# Patient Record
Sex: Male | Born: 1985 | Race: White | Hispanic: No | Marital: Married | Smoking: Never smoker
Health system: Southern US, Community
[De-identification: ages and names within clinical notes are randomized; demographics above are authoritative.]

## PROBLEM LIST (undated history)

## (undated) DIAGNOSIS — F419 Anxiety disorder, unspecified: Secondary | ICD-10-CM

## (undated) DIAGNOSIS — G4700 Insomnia, unspecified: Secondary | ICD-10-CM

## (undated) DIAGNOSIS — K219 Gastro-esophageal reflux disease without esophagitis: Secondary | ICD-10-CM

## (undated) DIAGNOSIS — J45909 Unspecified asthma, uncomplicated: Secondary | ICD-10-CM

## (undated) DIAGNOSIS — D179 Benign lipomatous neoplasm, unspecified: Secondary | ICD-10-CM

## (undated) DIAGNOSIS — T7840XA Allergy, unspecified, initial encounter: Secondary | ICD-10-CM

## (undated) DIAGNOSIS — F32A Depression, unspecified: Secondary | ICD-10-CM

## (undated) HISTORY — DX: Unspecified asthma, uncomplicated: J45.909

## (undated) HISTORY — DX: Allergy, unspecified, initial encounter: T78.40XA

## (undated) HISTORY — DX: Depression, unspecified: F32.A

## (undated) HISTORY — DX: Anxiety disorder, unspecified: F41.9

## (undated) HISTORY — DX: Benign lipomatous neoplasm, unspecified: D17.9

## (undated) HISTORY — DX: Insomnia, unspecified: G47.00

## (undated) HISTORY — DX: Gastro-esophageal reflux disease without esophagitis: K21.9

## (undated) HISTORY — PX: WISDOM TOOTH EXTRACTION: SHX21

---
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
## 2006-11-12 ENCOUNTER — Emergency Department: Payer: Self-pay | Admitting: Unknown Physician Specialty

## 2007-02-19 ENCOUNTER — Ambulatory Visit: Payer: Self-pay | Admitting: Specialist

## 2009-09-27 ENCOUNTER — Ambulatory Visit: Payer: Self-pay | Admitting: General Practice

## 2009-11-02 ENCOUNTER — Emergency Department: Payer: Self-pay | Admitting: Emergency Medicine

## 2010-06-30 ENCOUNTER — Ambulatory Visit: Payer: Self-pay | Admitting: Family Medicine

## 2011-12-23 IMAGING — CR DG LUMBAR SPINE 2-3V
1 series · 3 of 3 positions shown · non-contrast
Comparison: none

REASON FOR EXAM: pain
COMMENTS:

[Series 2: view not recorded · 0.17mm/px · 3 of 3 slices shown]
[im 1/3]
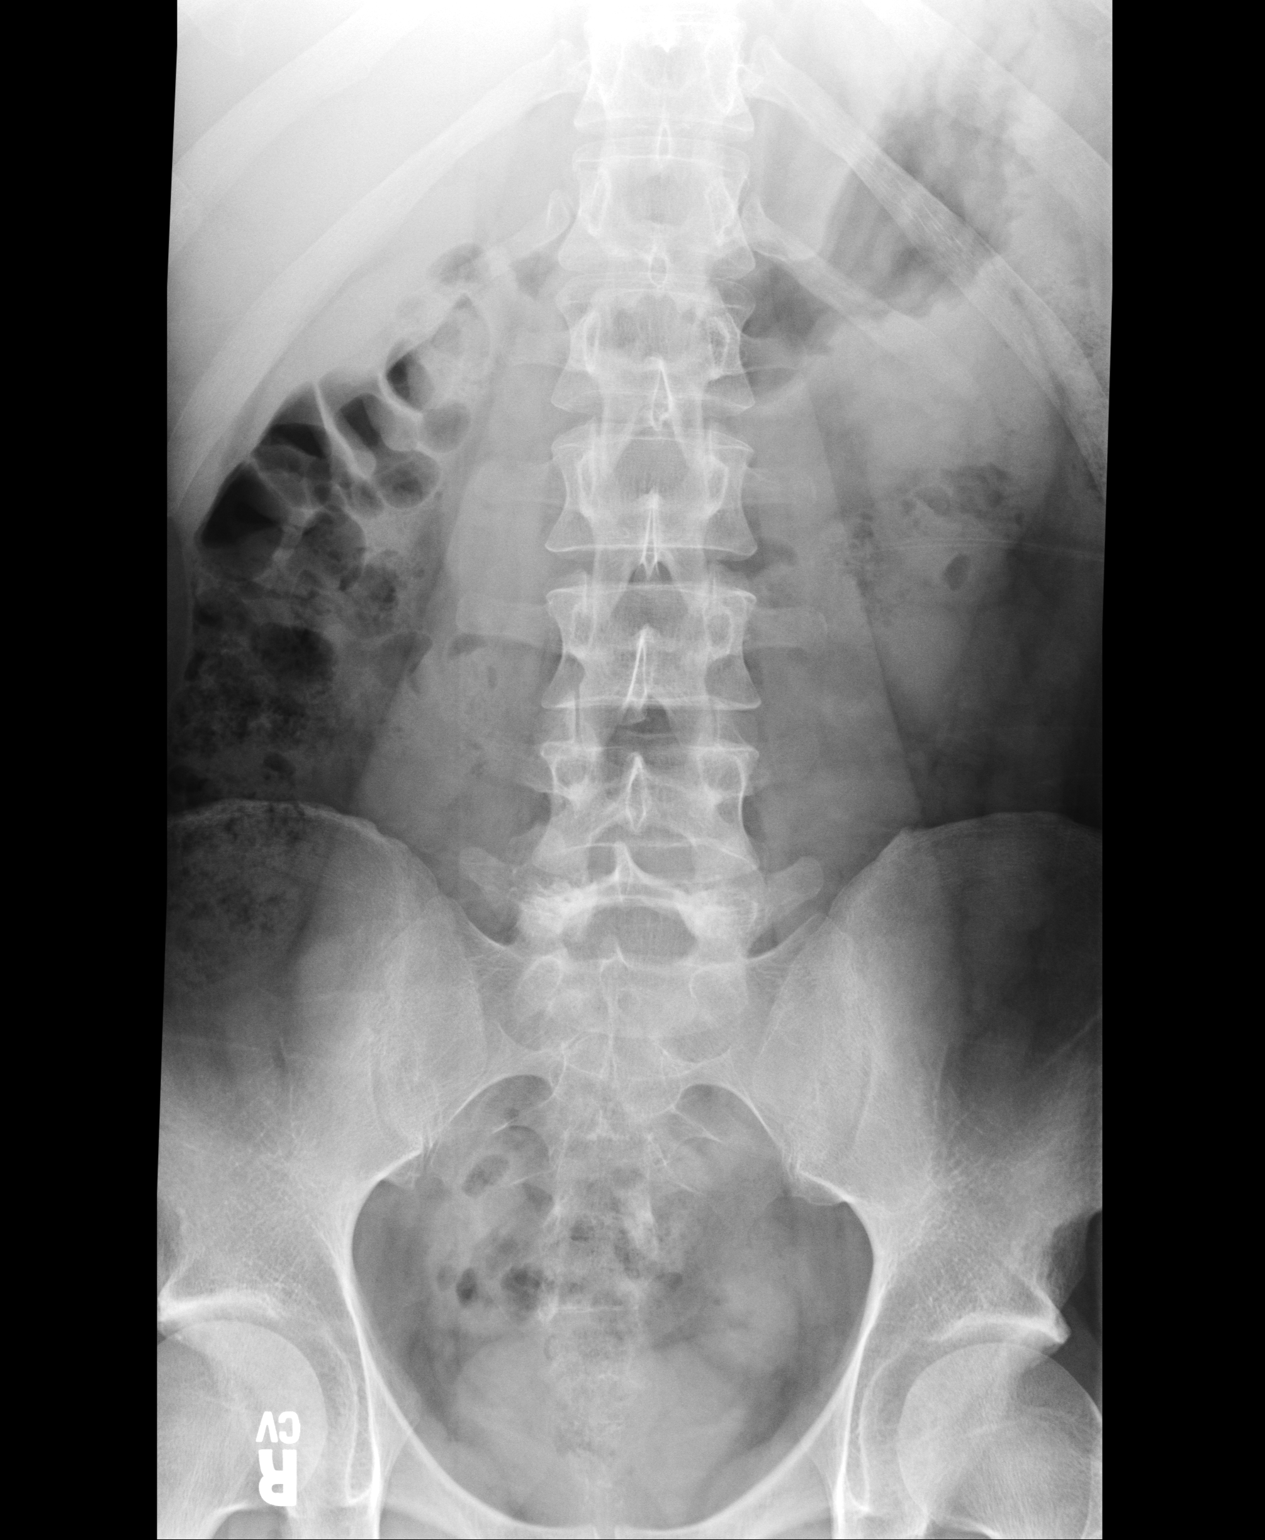
[im 2/3]
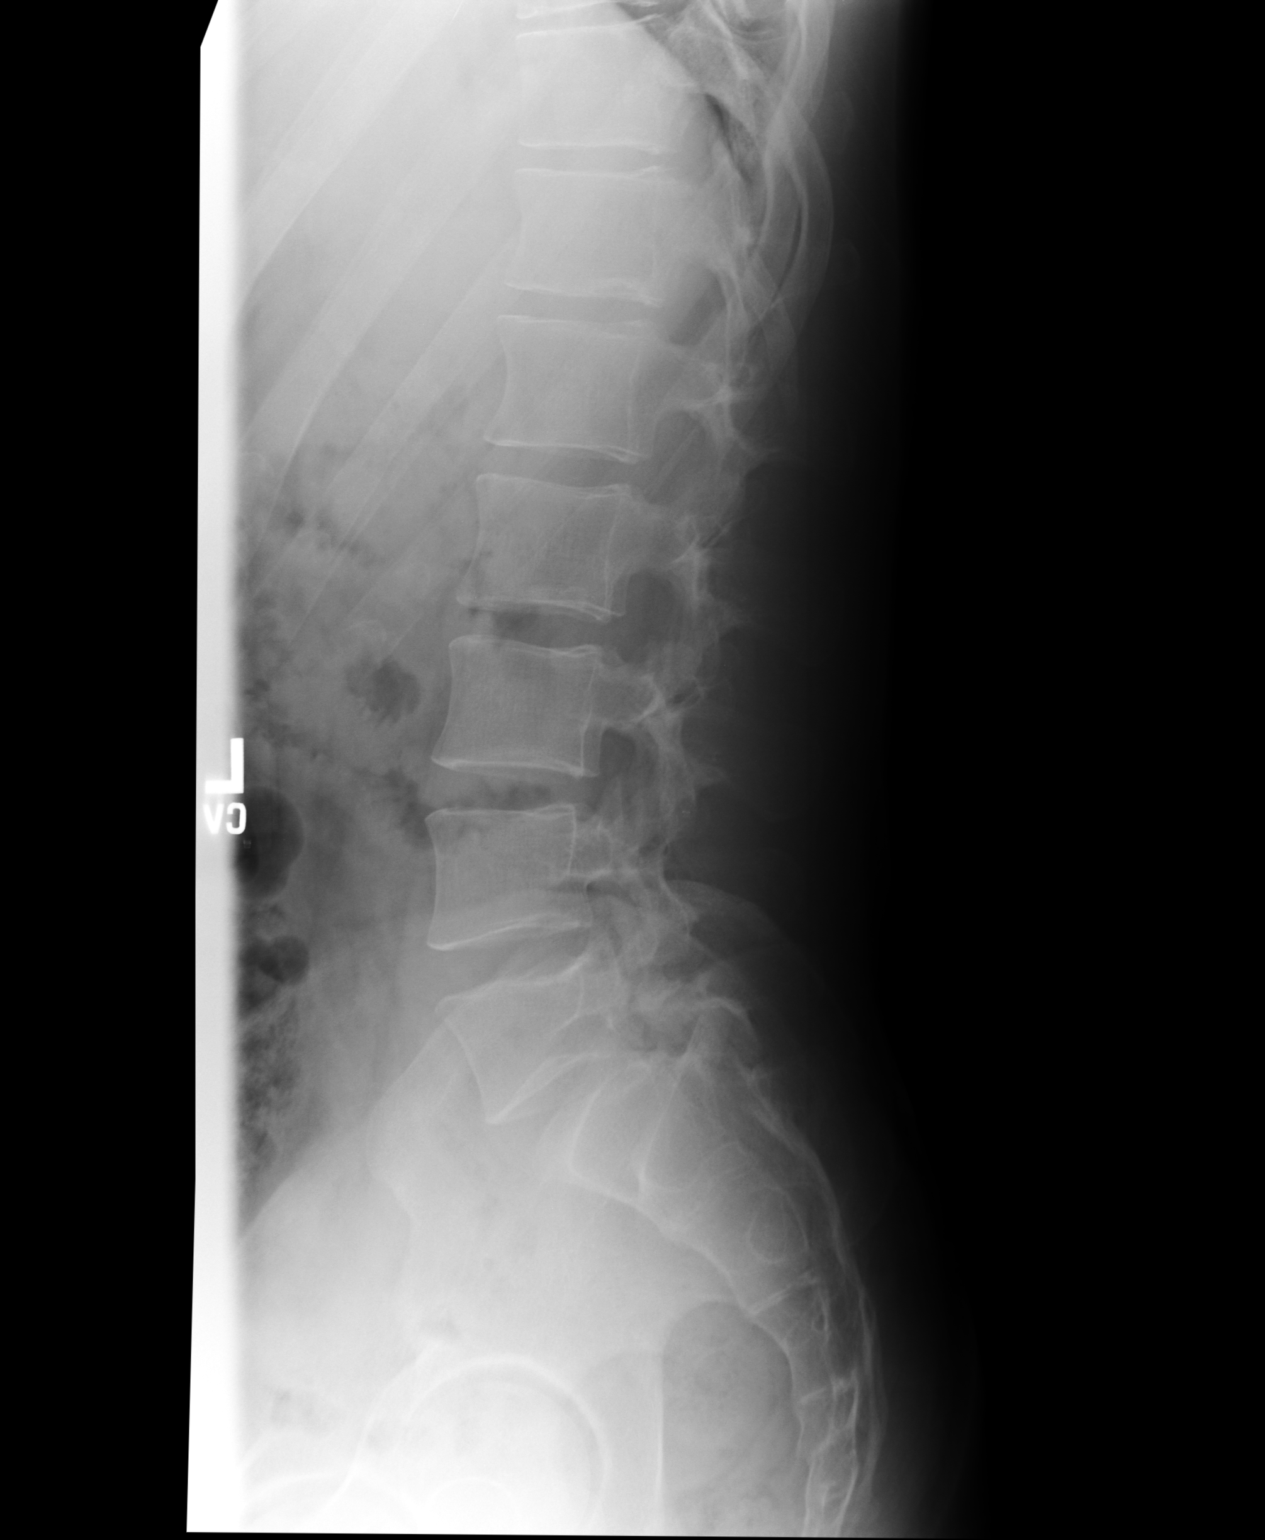
[im 3/3]
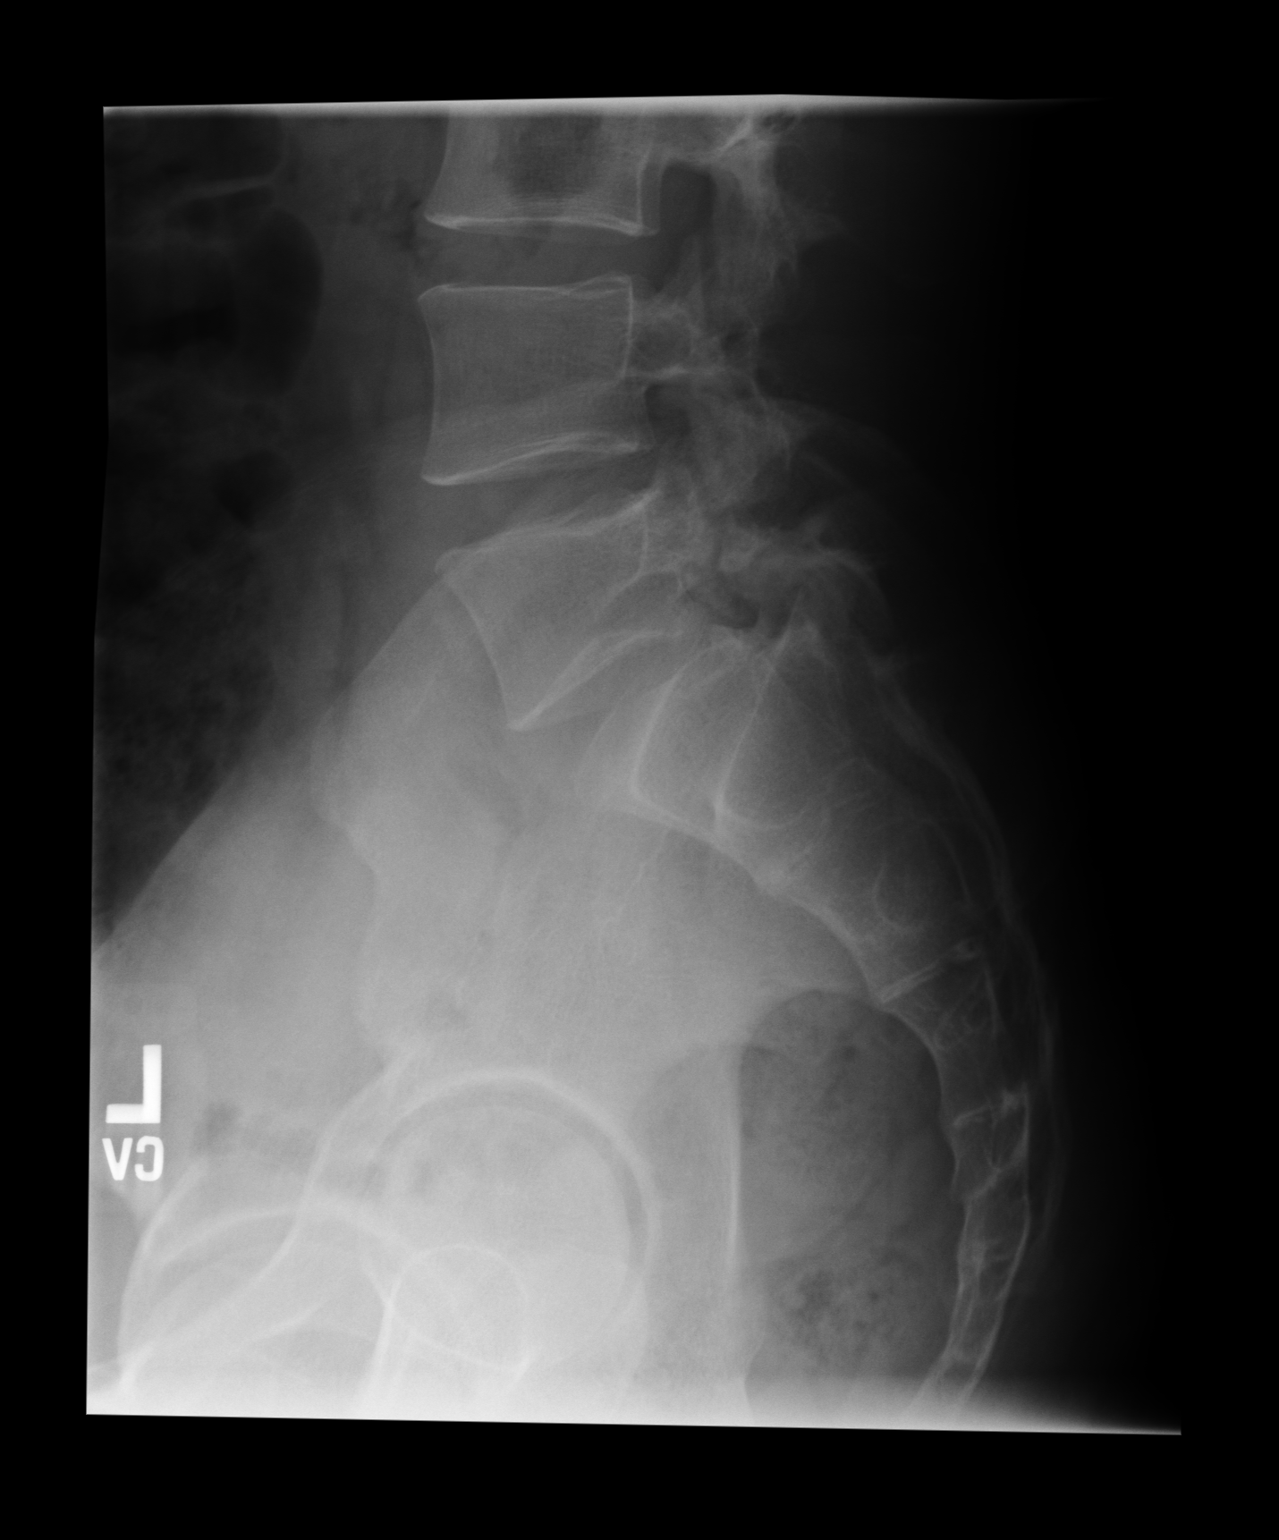

[3 of 3 positions shown; findings below may reference images not displayed]

PROCEDURE:     KDR - KDXR LUMBAR SPINE AP AND LATERAL  - September 27, 2009 [DATE]

RESULT:     The vertebral body heights and the intervertebral disc spaces
are well maintained. The vertebral alignment is normal. In the lateral view
there is noted a questionable pars interarticularis defect at L5. This could
be further evaluated CT of such is clinically indicated. The pedicles and
transverse processes are normal in appearance.
IMPRESSION: 1. No vertebral body fracture is seen.
2. Possible pars interarticularis defect at L5. This is not definite but
could be further evaluated with CT if clinically indicated .

## 2012-01-06 ENCOUNTER — Ambulatory Visit: Payer: Self-pay | Admitting: General Practice

## 2012-10-07 ENCOUNTER — Encounter: Payer: Self-pay | Admitting: Neurology

## 2012-10-19 NOTE — Telephone Encounter (Signed)
This encounter was created in error - please disregard.

## 2013-06-12 ENCOUNTER — Ambulatory Visit: Payer: Self-pay | Admitting: General Practice

## 2013-12-12 LAB — HEMOGLOBIN A1C: Hgb A1c MFr Bld: 4.9 % (ref 4.0–6.0)

## 2014-12-30 ENCOUNTER — Encounter: Payer: Self-pay | Admitting: Family Medicine

## 2014-12-30 DIAGNOSIS — J45991 Cough variant asthma: Secondary | ICD-10-CM | POA: Insufficient documentation

## 2014-12-30 DIAGNOSIS — K219 Gastro-esophageal reflux disease without esophagitis: Secondary | ICD-10-CM | POA: Insufficient documentation

## 2014-12-30 DIAGNOSIS — G4700 Insomnia, unspecified: Secondary | ICD-10-CM | POA: Insufficient documentation

## 2014-12-31 ENCOUNTER — Encounter: Payer: Self-pay | Admitting: Family Medicine

## 2014-12-31 ENCOUNTER — Ambulatory Visit (INDEPENDENT_AMBULATORY_CARE_PROVIDER_SITE_OTHER): Payer: No Typology Code available for payment source | Admitting: Family Medicine

## 2014-12-31 VITALS — BP 112/66 | HR 52 | Temp 98.0°F | Resp 16 | Ht 64.75 in | Wt 152.7 lb

## 2014-12-31 DIAGNOSIS — Z7189 Other specified counseling: Secondary | ICD-10-CM | POA: Diagnosis not present

## 2014-12-31 DIAGNOSIS — D179 Benign lipomatous neoplasm, unspecified: Secondary | ICD-10-CM | POA: Insufficient documentation

## 2014-12-31 DIAGNOSIS — Z0000 Encounter for general adult medical examination without abnormal findings: Secondary | ICD-10-CM

## 2014-12-31 DIAGNOSIS — Z131 Encounter for screening for diabetes mellitus: Secondary | ICD-10-CM

## 2014-12-31 DIAGNOSIS — Z719 Counseling, unspecified: Secondary | ICD-10-CM

## 2014-12-31 DIAGNOSIS — Z13220 Encounter for screening for lipoid disorders: Secondary | ICD-10-CM | POA: Diagnosis not present

## 2014-12-31 DIAGNOSIS — Z114 Encounter for screening for human immunodeficiency virus [HIV]: Secondary | ICD-10-CM

## 2014-12-31 DIAGNOSIS — L818 Other specified disorders of pigmentation: Secondary | ICD-10-CM | POA: Insufficient documentation

## 2014-12-31 NOTE — Patient Instructions (Signed)
Discussed importance of 150 minutes of physical activity weekly, eat two servings of fish weekly, eat one serving of tree nuts ( cashews, pistachios, pecans, almonds..) every other day, eat 6 servings of fruit/vegetables daily and drink plenty of water and avoid sweet beverages. 

## 2014-12-31 NOTE — Progress Notes (Signed)
Name: Anthony Hale   MRN: [id number]    DOB: 02-Apr-1986   Date:12/31/2014       Progress Note  Subjective  Chief Complaint  Chief Complaint  Patient presents with  . Annual Exam    HPI  Male physical:  He is not sexually active at this time, dating for the past 7 months.  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  He has been eating fast food more often because of his job, but will try to pack lunch. He has not been physically active lately because he has been working 12 hours shifts.  Patient Active Problem List   Diagnosis Date Noted  . Fatty tumor 12/31/2014  . Decorative tattoo 12/31/2014  . Asthma, cough variant 12/30/2014  . Insomnia 12/30/2014  . GERD (gastroesophageal reflux disease) 12/30/2014  . Anxiety disorder 12/30/2014  . Perennial allergic rhinitis with seasonal variation 05/23/2007    Past Surgical History  Procedure Laterality Date  . Wisdom tooth extraction      Family History  Problem Relation Age of Onset  . Cancer Mother     Breast  . Hyperlipidemia Mother   . Heart disease Father   . Hyperlipidemia Father   . Hypertension Father     History   Social History  . Marital Status: Single    Spouse Name: N/A  . Number of Children: N/A  . Years of Education: N/A   Occupational History  . Not on file.   Social History Main Topics  . Smoking status: Never Smoker   . Smokeless tobacco: Not on file  . Alcohol Use: 0.0 oz/week    0 Standard drinks or equivalent per week     Comment: rarely  . Drug Use: No  . Sexual Activity: No   Other Topics Concern  . Not on file   Social History Narrative     Current outpatient prescriptions:  .  fluticasone (FLONASE) 50 MCG/ACT nasal spray, Place 2 sprays into both nostrils daily., Disp: , Rfl:  .  Fluticasone Furoate-Vilanterol (BREO ELLIPTA) 100-25 MCG/INH AEPB, Inhale 1 puff into the lungs as needed., Disp: , Rfl:  .   levocetirizine (XYZAL) 5 MG tablet, Take 5 mg by mouth every evening., Disp: , Rfl:  .  montelukast (SINGULAIR) 10 MG tablet, Take 1 tablet by mouth daily., Disp: , Rfl:  .  PARoxetine (PAXIL) 20 MG tablet, Take 1 tablet by mouth daily., Disp: , Rfl:  .  Multiple Vitamin (ONE-A-DAY MENS PO), Take 1 tablet by mouth daily., Disp: , Rfl:   No Known Allergies   ROS  Constitutional: Negative for fever or weight change.  Respiratory: Negative for shortness of breath.  He still has a dry cough but never started Breo  Cardiovascular: Negative for chest pain or palpitations.  Gastrointestinal: Negative for abdominal pain, no bowel changes.  Musculoskeletal: Negative for gait problem or joint swelling.  Skin: Negative for rash.  Neurological: Negative for dizziness or headache.  No other specific complaints in a complete review of systems (except as listed in HPI above).  Objective  Filed Vitals:   12/31/14 1147  BP: 112/66  Pulse: 52  Temp: 98 F (36.7 C)  TempSrc: Oral  Resp: 16  Height: 5' 4.75" (1.645 m)  Weight: 152 lb 11.2 oz (69.264 kg)  SpO2: 98%    Body mass index is 25.6 kg/(m^2).  Physical Exam  Constitutional:  Patient appears well-developed and well-nourished. No distress.  HENT: Head: Normocephalic and atraumatic. Ears: B TMs ok, no erythema or effusion; Nose: Nose normal. Mouth/Throat: Oropharynx is clear and moist. No oropharyngeal exudate.  Eyes: Conjunctivae and EOM are normal. Pupils are equal, round, and reactive to light. No scleral icterus.  Neck: Normal range of motion. Neck supple. No JVD present. No thyromegaly present.  Cardiovascular: Normal rate, regular rhythm and normal heart sounds.  No murmur heard. No BLE edema. Pulmonary/Chest: Effort normal and breath sounds normal. No respiratory distress. Abdominal: Soft. Bowel sounds are normal, no distension. There is no tenderness. no masses MALE GENITALIA: Normal descended testes bilaterally, no masses  palpated, no hernias, no lesions, no discharge RECTAL: not performed Musculoskeletal: Normal range of motion, no joint effusions. No gross deformities Neurological: he is alert and oriented to person, place, and time. No cranial nerve deficit. Coordination, balance, strength, speech and gait are normal.  Skin: Skin is warm and dry. No rash noted. No erythema.  Psychiatric: Patient has a normal mood and affect. behavior is normal. Judgment and thought content normal.    PHQ2/9: Depression screen PHQ 2/9 12/31/2014  Decreased Interest 1  Down, Depressed, Hopeless 0  PHQ - 2 Score 1     Fall Risk: Fall Risk  12/31/2014  Falls in the past year? No     Assessment & Plan  1. Encounter for routine history and physical exam for male    2. Health counseling Discussed importance of 150 minutes of physical activity weekly, eat two servings of fish weekly, eat one serving of tree nuts ( cashews, pistachios, pecans, almonds.Marland Kitchen) every other day, eat 6 servings of fruit/vegetables daily and drink plenty of water and avoid sweet beverages.    3. Screening for HIV (human immunodeficiency virus)  - HIV antibody (with reflex)  4. Diabetes mellitus screening  - Glucose  5. Lipid screening  - Lipid Profile

## 2015-01-01 LAB — GLUCOSE, RANDOM: Glucose: 92 mg/dL (ref 65–99)

## 2015-01-01 LAB — LIPID PANEL
Chol/HDL Ratio: 5 ratio units (ref 0.0–5.0)
Cholesterol, Total: 179 mg/dL (ref 100–199)
HDL: 36 mg/dL — ABNORMAL LOW (ref >39)
LDL Calculated: 116 mg/dL — ABNORMAL HIGH (ref 0–99)
Triglycerides: 134 mg/dL (ref 0–149)
VLDL Cholesterol Cal: 27 mg/dL (ref 5–40)

## 2015-01-01 LAB — HIV ANTIBODY (ROUTINE TESTING W REFLEX): HIV Screen 4th Generation wRfx: NONREACTIVE

## 2015-01-02 ENCOUNTER — Telehealth: Payer: Self-pay

## 2015-01-02 NOTE — Telephone Encounter (Signed)
-----   Message from Steele Sizer, MD sent at 01/01/2015  1:40 PM EDT ----- Lipid panel shows low HDL : to improve HDL patient  needs to eat tree nuts ( pecans/pistachios/almonds ) four times weekly, eat fish two times weekly  and exercise  at least 150 minutes per week Glucose and HIV neg Please notify patient, thank you

## 2015-01-02 NOTE — Telephone Encounter (Signed)
Spoke with patient and informed him of his labs, and patient verbalized understanding of ways to bring up his HDL.

## 2015-04-02 ENCOUNTER — Ambulatory Visit: Payer: No Typology Code available for payment source | Admitting: Family Medicine

## 2015-04-03 ENCOUNTER — Ambulatory Visit (INDEPENDENT_AMBULATORY_CARE_PROVIDER_SITE_OTHER): Payer: No Typology Code available for payment source | Admitting: Family Medicine

## 2015-04-03 ENCOUNTER — Encounter: Payer: Self-pay | Admitting: Family Medicine

## 2015-04-03 VITALS — BP 114/86 | HR 61 | Temp 97.7°F | Resp 18 | Ht 65.0 in | Wt 155.7 lb

## 2015-04-03 DIAGNOSIS — K219 Gastro-esophageal reflux disease without esophagitis: Secondary | ICD-10-CM

## 2015-04-03 DIAGNOSIS — J302 Other seasonal allergic rhinitis: Secondary | ICD-10-CM

## 2015-04-03 DIAGNOSIS — J45991 Cough variant asthma: Secondary | ICD-10-CM

## 2015-04-03 DIAGNOSIS — J3089 Other allergic rhinitis: Secondary | ICD-10-CM

## 2015-04-03 DIAGNOSIS — Z23 Encounter for immunization: Secondary | ICD-10-CM

## 2015-04-03 DIAGNOSIS — F411 Generalized anxiety disorder: Secondary | ICD-10-CM

## 2015-04-03 DIAGNOSIS — J309 Allergic rhinitis, unspecified: Secondary | ICD-10-CM

## 2015-04-03 DIAGNOSIS — K589 Irritable bowel syndrome without diarrhea: Secondary | ICD-10-CM

## 2015-04-03 MED ORDER — LEVALBUTEROL TARTRATE 45 MCG/ACT IN AERO: 2.0000 | INHALATION_SPRAY | RESPIRATORY_TRACT | Status: DC | PRN

## 2015-04-03 MED ORDER — RANITIDINE HCL 300 MG PO TABS: 300.0000 mg | ORAL_TABLET | Freq: Every day | ORAL | Status: DC

## 2015-04-03 MED ORDER — HYOSCYAMINE SULFATE 0.125 MG PO TABS: 0.1250 mg | ORAL_TABLET | ORAL | Status: DC | PRN

## 2015-04-03 NOTE — Progress Notes (Addendum)
Name: Anthony Hale   MRN: [id number]    DOB: 26-Feb-1986   Date:04/03/2015       Progress Note  Subjective  Chief Complaint  Chief Complaint  Patient presents with  . Follow-up  . Asthma    not taking medications(for anything)  . Allergic Rhinitis   . Depression    HPI  Asthma Mild Intermittent: he is doing well, seen by allergist but not compliant with medication.    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   Perennial AR: also stopped taking medication, still has sniffles and sneezing, but no itchy eyes or nose at this time. He will call back when he needs refills  GERD: symptoms are returning, had two episode this week of heartburn while eating dinner . No regurgitation.  He used to take PPI's but nothing recently.   IBS: symptoms since teenage years.  Described as abdominal cramping, urgency to have a bowel movement, diarrhea. He is not sure what triggers, but seems worse in am's. No blood in stools, it does not wake him up at night. No weight loss. Evaluated many years ago but multiple test and negative ( per patient). He states the cramping is the worse symptom  Patient Active Problem List   Diagnosis Date Noted  . IBS (irritable bowel syndrome) 04/03/2015  . Fatty tumor 12/31/2014  . Decorative tattoo 12/31/2014  . Asthma, cough variant 12/30/2014  . Insomnia 12/30/2014  . GERD (gastroesophageal reflux disease) 12/30/2014  . Anxiety disorder 12/30/2014  . Perennial allergic rhinitis with seasonal variation 05/23/2007    Past Surgical History  Procedure Laterality Date  . Wisdom tooth extraction      Family History   Problem Relation Age of Onset  . Cancer Mother     Breast  . Hyperlipidemia Mother   . Heart disease Father   . Hyperlipidemia Father   . Hypertension Father     Social History   Social History  . Marital Status: Single    Spouse Name: N/A  . Number of Children: N/A  . Years of Education: N/A   Occupational History  . Not on file.   Social History Main Topics  . Smoking status: Never Smoker   . Smokeless tobacco: Never Used  . Alcohol Use: 0.0 oz/week    0 Standard drinks or equivalent per week     Comment: rarely  . Drug Use: No  . Sexual Activity: No   Other Topics Concern  . Not on file   Social History Narrative     Current outpatient prescriptions:  .  fluticasone (FLONASE) 50 MCG/ACT nasal spray, Place 2 sprays into both nostrils daily., Disp: , Rfl:  .  hyoscyamine (LEVSIN) 0.125 MG tablet, Take 1 tablet (0.125 mg total) by mouth every 4 (four) hours as needed for cramping., Disp: 30 tablet, Rfl: 2 .  levalbuterol (XOPENEX HFA) 45 MCG/ACT inhaler, Inhale 2 puffs into the lungs every 4 (four) hours as needed for wheezing., Disp: 1 Inhaler, Rfl: 0 .  levocetirizine (XYZAL) 5 MG tablet, Take 5 mg by mouth every evening., Disp: , Rfl:  .  montelukast (SINGULAIR) 10 MG tablet, Take 1  tablet by mouth daily., Disp: , Rfl:  .  Multiple Vitamin (ONE-A-DAY MENS PO), Take 1 tablet by mouth daily., Disp: , Rfl:  .  ranitidine (ZANTAC) 300 MG tablet, Take 1 tablet (300 mg total) by mouth at bedtime., Disp: 30 tablet, Rfl: 2  No Known Allergies   ROS  Constitutional: Negative for fever or weight change.  Respiratory: Negative for cough and shortness of breath.   Cardiovascular: Negative for chest pain or palpitations.  Gastrointestinal: positive for abdominal pain intermittent, no bowel changes.  Musculoskeletal: Negative for gait problem or joint swelling.  Skin: Negative for rash.  Neurological: Negative for dizziness or headache.  No other specific complaints in  a complete review of systems (except as listed in HPI above).  Objective  Filed Vitals:   04/03/15 0919  BP: 114/86  Pulse: 61  Temp: 97.7 F (36.5 C)  TempSrc: Oral  Resp: 18  Height: 5\' 5"  (1.651 m)  Weight: 155 lb 11.2 oz (70.625 kg)  SpO2: 99%    Body mass index is 25.91 kg/(m^2).  Physical Exam  Constitutional: Patient appears well-developed and well-nourished.  No distress.  HEENT: head atraumatic, normocephalic, pupils equal and reactive to light, neck supple, throat within normal limits Cardiovascular: Normal rate, regular rhythm and normal heart sounds.  No murmur heard. No BLE edema. Pulmonary/Chest: Effort normal and breath sounds normal. No respiratory distress. Abdominal: Soft.  There is no tenderness. Psychiatric: Patient has a normal mood and affect. behavior is normal. Judgment and thought content normal.   PHQ2/9: Depression screen [facility] 2/9 04/03/2015 12/31/2014  Decreased Interest 0 1  Down, Depressed, Hopeless 0 0  PHQ - 2 Score 0 1     Fall Risk: Fall Risk  04/03/2015 12/31/2014  Falls in the past year? No No      Functional Status Survey: Is the patient deaf or have difficulty hearing?: No Does the patient have difficulty seeing, even when wearing glasses/contacts?: No Does the patient have difficulty concentrating, remembering, or making decisions?: No Does the patient have difficulty walking or climbing stairs?: No Does the patient have difficulty dressing or bathing?: No Does the patient have difficulty doing errands alone such as visiting a doctor's office or shopping?: No   Assessment & Plan  1. Gastroesophageal reflux disease without esophagitis  - ranitidine (ZANTAC) 300 MG tablet; Take 1 tablet (300 mg total) by mouth at bedtime.  Dispense: 30 tablet; Refill: 2  2. Needs flu shot  - Flu Vaccine QUAD 36+ mos PF IM (Fluarix & Fluzone Quad PF)  3. Asthma, cough variant  FEV/FVC 78% of predicted, but he does not want to resume  medication at this time Discussed importance of having rescue inhaler with him -Xopenex   4. Generalized anxiety disorder Doing better, states stress is better at home  5. Perennial allergic rhinitis with seasonal variation Resume medication prn   6. IBS (irritable bowel syndrome)  - hyoscyamine (LEVSIN) 0.125 MG tablet; Take 1 tablet (0.125 mg total) by mouth every 4 (four) hours as needed for cramping.  Dispense: 30 tablet; Refill: 2

## 2015-04-08 ENCOUNTER — Telehealth: Payer: Self-pay

## 2015-04-08 NOTE — Telephone Encounter (Signed)
Can you please do a PA for this patient Inhaler. Thanks

## 2015-04-08 NOTE — Telephone Encounter (Signed)
He can't tolerate albuterol, that is why I sent for Xopenex, he will need PA, causes tachycardia and anxiety

## 2015-04-08 NOTE — Telephone Encounter (Signed)
Insurance will not cover Xopenex HFA until patient tries Ventolin HFA first per [employer]. Please change prescription and send into pharmacy.

## 2015-04-09 NOTE — Telephone Encounter (Signed)
PA was done on this and unfortunately was denied

## 2015-04-12 ENCOUNTER — Other Ambulatory Visit: Payer: Self-pay | Admitting: Family Medicine

## 2015-04-12 MED ORDER — ALBUTEROL SULFATE HFA 108 (90 BASE) MCG/ACT IN AERS: 2.0000 | INHALATION_SPRAY | Freq: Four times a day (QID) | RESPIRATORY_TRACT | Status: DC | PRN

## 2015-04-12 NOTE — Progress Notes (Signed)
done

## 2015-04-12 NOTE — Telephone Encounter (Signed)
Is there another Inhaler you could prescribed the patient? The PA for Xopenex was denied.

## 2015-04-29 ENCOUNTER — Ambulatory Visit: Payer: No Typology Code available for payment source | Admitting: Family Medicine

## 2015-05-02 ENCOUNTER — Ambulatory Visit: Payer: No Typology Code available for payment source | Admitting: Family Medicine

## 2015-05-20 ENCOUNTER — Encounter: Payer: Self-pay | Admitting: Family Medicine

## 2015-05-20 ENCOUNTER — Encounter (INDEPENDENT_AMBULATORY_CARE_PROVIDER_SITE_OTHER): Payer: Self-pay

## 2015-05-20 ENCOUNTER — Ambulatory Visit (INDEPENDENT_AMBULATORY_CARE_PROVIDER_SITE_OTHER): Payer: No Typology Code available for payment source | Admitting: Family Medicine

## 2015-05-20 VITALS — BP 124/68 | HR 65 | Temp 98.2°F | Resp 16 | Ht 65.0 in | Wt 155.1 lb

## 2015-05-20 DIAGNOSIS — K219 Gastro-esophageal reflux disease without esophagitis: Secondary | ICD-10-CM | POA: Diagnosis not present

## 2015-05-20 DIAGNOSIS — Z23 Encounter for immunization: Secondary | ICD-10-CM | POA: Diagnosis not present

## 2015-05-20 DIAGNOSIS — Z113 Encounter for screening for infections with a predominantly sexual mode of transmission: Secondary | ICD-10-CM

## 2015-05-20 NOTE — Progress Notes (Signed)
Name: Anthony Hale   MRN: [id number]    DOB: [birth year]   Date:05/20/2015       Progress Note  Subjective  Chief Complaint  Chief Complaint  Patient presents with  . Labs Only    patient states is getting married and wants to be checked out for STD's patient states not having any syptoms    HPI  GERD: he has noticed burning sensation that goes up to his throat, over this past weekend, he states he ate home made [place] food, but resolved without medication. No regurgitation.   Engaged: he is getting married in March 2016 to [place], they have not been sexually active, he would like to be checked for all STI's - except HIV since he recently had labs done.   Patient Active Problem List   Diagnosis Date Noted  . IBS (irritable bowel syndrome) 04/03/2015  . Fatty tumor 12/31/2014  . Decorative tattoo 12/31/2014  . Asthma, cough variant 12/30/2014  . Insomnia 12/30/2014  . GERD (gastroesophageal reflux disease) 12/30/2014  . Anxiety disorder 12/30/2014  . Perennial allergic rhinitis with seasonal variation 05/23/2007    Past Surgical History  Procedure Laterality Date  . Wisdom tooth extraction      Family History  Problem Relation Age of Onset  . Cancer Mother     Breast  . Hyperlipidemia Mother   . Heart disease Father   . Hyperlipidemia Father   . Hypertension Father     Social History   Social History  . Marital Status: Single    Spouse Name: N/A  . Number of Children: N/A  . Years of Education: N/A   Occupational History  . Not on file.   Social History Main Topics  . Smoking status: Never Smoker   . Smokeless tobacco: Never Used  . Alcohol Use: 0.0 oz/week    0 Standard drinks or equivalent per week     Comment: rarely  . Drug Use: No  . Sexual Activity: No   Other Topics Concern  . Not on file   Social History Narrative     Current outpatient prescriptions:  .  albuterol (PROVENTIL HFA;VENTOLIN HFA) 108 (90 BASE) MCG/ACT inhaler, Inhale 2  puffs into the lungs every 6 (six) hours as needed for wheezing or shortness of breath., Disp: 1 Inhaler, Rfl: 1 .  fluticasone (FLONASE) 50 MCG/ACT nasal spray, Place 2 sprays into both nostrils daily., Disp: , Rfl:  .  hyoscyamine (LEVSIN) 0.125 MG tablet, Take 1 tablet (0.125 mg total) by mouth every 4 (four) hours as needed for cramping., Disp: 30 tablet, Rfl: 2 .  levocetirizine (XYZAL) 5 MG tablet, Take 5 mg by mouth every evening., Disp: , Rfl:  .  montelukast (SINGULAIR) 10 MG tablet, Take 1 tablet by mouth daily., Disp: , Rfl:  .  Multiple Vitamin (ONE-A-DAY MENS PO), Take 1 tablet by mouth daily., Disp: , Rfl:  .  ranitidine (ZANTAC) 300 MG tablet, Take 1 tablet (300 mg total) by mouth at bedtime., Disp: 30 tablet, Rfl: 2  No Known Allergies   ROS  Ten systems reviewed and is negative except as mentioned in HPI   Objective  Filed Vitals:   05/20/15 1036  BP: 124/68  Pulse: 65  Temp: 98.2 F (36.8 C)  TempSrc: Oral  Resp: 16  Height: 5\' 5"  (1.651 m)  Weight: 155 lb 1.6 oz (70.353 kg)  SpO2: 97%    Body mass index is 25.81 kg/(m^2).  Physical Exam  Constitutional: Patient  appears well-developed and well-nourished.  No distress.  HEENT: head atraumatic, normocephalic, pupils equal and reactive to light, neck supple, throat within normal limits Cardiovascular: Normal rate, regular rhythm and normal heart sounds.  No murmur heard. No BLE edema. Pulmonary/Chest: Effort normal and breath sounds normal. No respiratory distress. Abdominal: Soft.  There is no tenderness. Psychiatric: Patient has a normal mood and affect. behavior is normal. Judgment and thought content normal.  PHQ2/9: Depression screen [facility] 2/9 04/03/2015 12/31/2014  Decreased Interest 0 1  Down, Depressed, Hopeless 0 0  PHQ - 2 Score 0 1    Fall Risk: Fall Risk  04/03/2015 12/31/2014  Falls in the past year? No No     Assessment & Plan  1. Gastroesophageal reflux disease without  esophagitis  Continue prn medication   2. Routine screening for STI (sexually transmitted infection)  - [facility] - GC/chlamydia probe amp, urine - HSV 2 antibody, IgG

## 2015-05-21 LAB — HSV 2 ANTIBODY, IGG: HSV 2 Glycoprotein G Ab, IgG: <0.91 index (ref 0.00–0.90)

## 2015-05-21 LAB — RPR: RPR: NONREACTIVE

## 2015-05-24 LAB — GC/CHLAMYDIA PROBE AMP, URINE

## 2015-05-28 ENCOUNTER — Encounter: Payer: Self-pay | Admitting: Family Medicine

## 2015-06-26 ENCOUNTER — Other Ambulatory Visit
Admission: RE | Admit: 2015-06-26 | Discharge: 2015-06-26 | Disposition: A | Discharge status: Home / Self Care | Payer: Worker's Compensation | Attending: Family Medicine | Admitting: Family Medicine

## 2015-06-26 NOTE — ED Notes (Signed)
Patient ambulatory to triage with steady gait, without difficulty or distress noted; pt employeed with [employer]; brought in for post accident workers comp screening ([employer] only per profile and per supervisor); pt denies any injuries or c/o or need to be seen by ED provider; workers comp screening completed by EDT, [place]

## 2015-07-25 ENCOUNTER — Telehealth: Payer: Self-pay

## 2015-07-25 ENCOUNTER — Other Ambulatory Visit: Payer: Self-pay | Admitting: Family Medicine

## 2015-07-25 NOTE — Telephone Encounter (Signed)
Patient requesting refill. 

## 2015-07-25 NOTE — Telephone Encounter (Signed)
Patient called about his Paxil rx. After checking his records I see where it was sent electronically but the transmission failed, so I verbally called it in to [facility] in [place].

## 2015-08-28 ENCOUNTER — Ambulatory Visit (INDEPENDENT_AMBULATORY_CARE_PROVIDER_SITE_OTHER): Payer: Managed Care, Other (non HMO) | Admitting: Family Medicine

## 2015-08-28 ENCOUNTER — Encounter: Payer: Self-pay | Admitting: Family Medicine

## 2015-08-28 VITALS — BP 112/64 | HR 81 | Temp 98.4°F | Resp 16 | Ht 65.0 in | Wt 153.9 lb

## 2015-08-28 DIAGNOSIS — F330 Major depressive disorder, recurrent, mild: Secondary | ICD-10-CM | POA: Diagnosis not present

## 2015-08-28 DIAGNOSIS — F411 Generalized anxiety disorder: Secondary | ICD-10-CM | POA: Diagnosis not present

## 2015-08-28 DIAGNOSIS — J309 Allergic rhinitis, unspecified: Secondary | ICD-10-CM | POA: Diagnosis not present

## 2015-08-28 DIAGNOSIS — J302 Other seasonal allergic rhinitis: Secondary | ICD-10-CM

## 2015-08-28 DIAGNOSIS — H9313 Tinnitus, bilateral: Secondary | ICD-10-CM

## 2015-08-28 DIAGNOSIS — G4700 Insomnia, unspecified: Secondary | ICD-10-CM

## 2015-08-28 DIAGNOSIS — K219 Gastro-esophageal reflux disease without esophagitis: Secondary | ICD-10-CM | POA: Diagnosis not present

## 2015-08-28 DIAGNOSIS — J3089 Other allergic rhinitis: Secondary | ICD-10-CM

## 2015-08-28 MED ORDER — TRAZODONE HCL 50 MG PO TABS: 25.0000 mg | ORAL_TABLET | Freq: Every evening | ORAL | Status: DC | PRN

## 2015-08-28 MED ORDER — PAROXETINE HCL 20 MG PO TABS: ORAL_TABLET | ORAL | Status: DC

## 2015-08-28 MED ORDER — LEVOCETIRIZINE DIHYDROCHLORIDE 5 MG PO TABS: 5.0000 mg | ORAL_TABLET | Freq: Every evening | ORAL | Status: DC

## 2015-08-28 MED ORDER — FLUTICASONE PROPIONATE 50 MCG/ACT NA SUSP: 2.0000 | Freq: Every day | NASAL | Status: DC

## 2015-08-28 MED ORDER — MONTELUKAST SODIUM 10 MG PO TABS: 10.0000 mg | ORAL_TABLET | Freq: Every day | ORAL | Status: DC
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
## 2015-09-07 IMAGING — CR DG CHEST 2V
1 series · 2 of 2 positions shown · non-contrast
Comparison: 01/06/2012

CLINICAL DATA: Nonproductive cough

EXAM:
CHEST  2 VIEW

[Series 1: pa · 0.17mm/px · 2 of 2 slices shown]
[im 1/2]
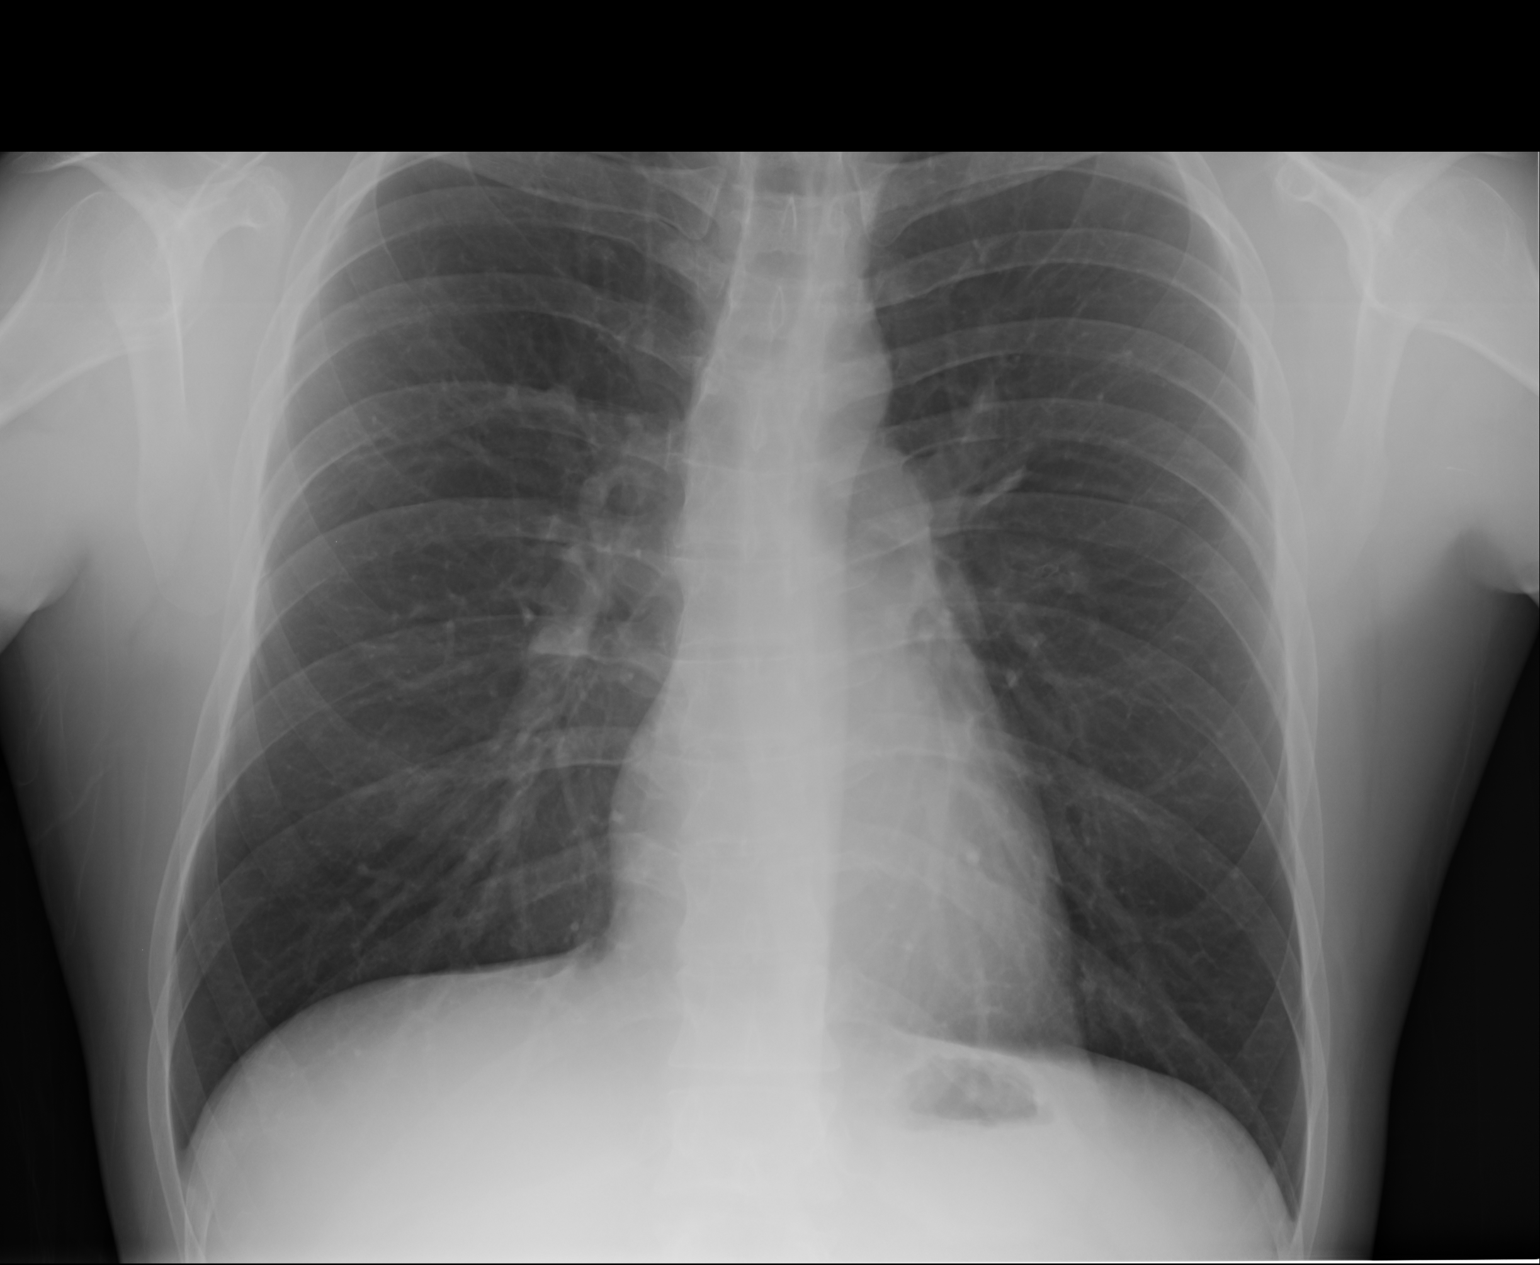
[im 2/2]
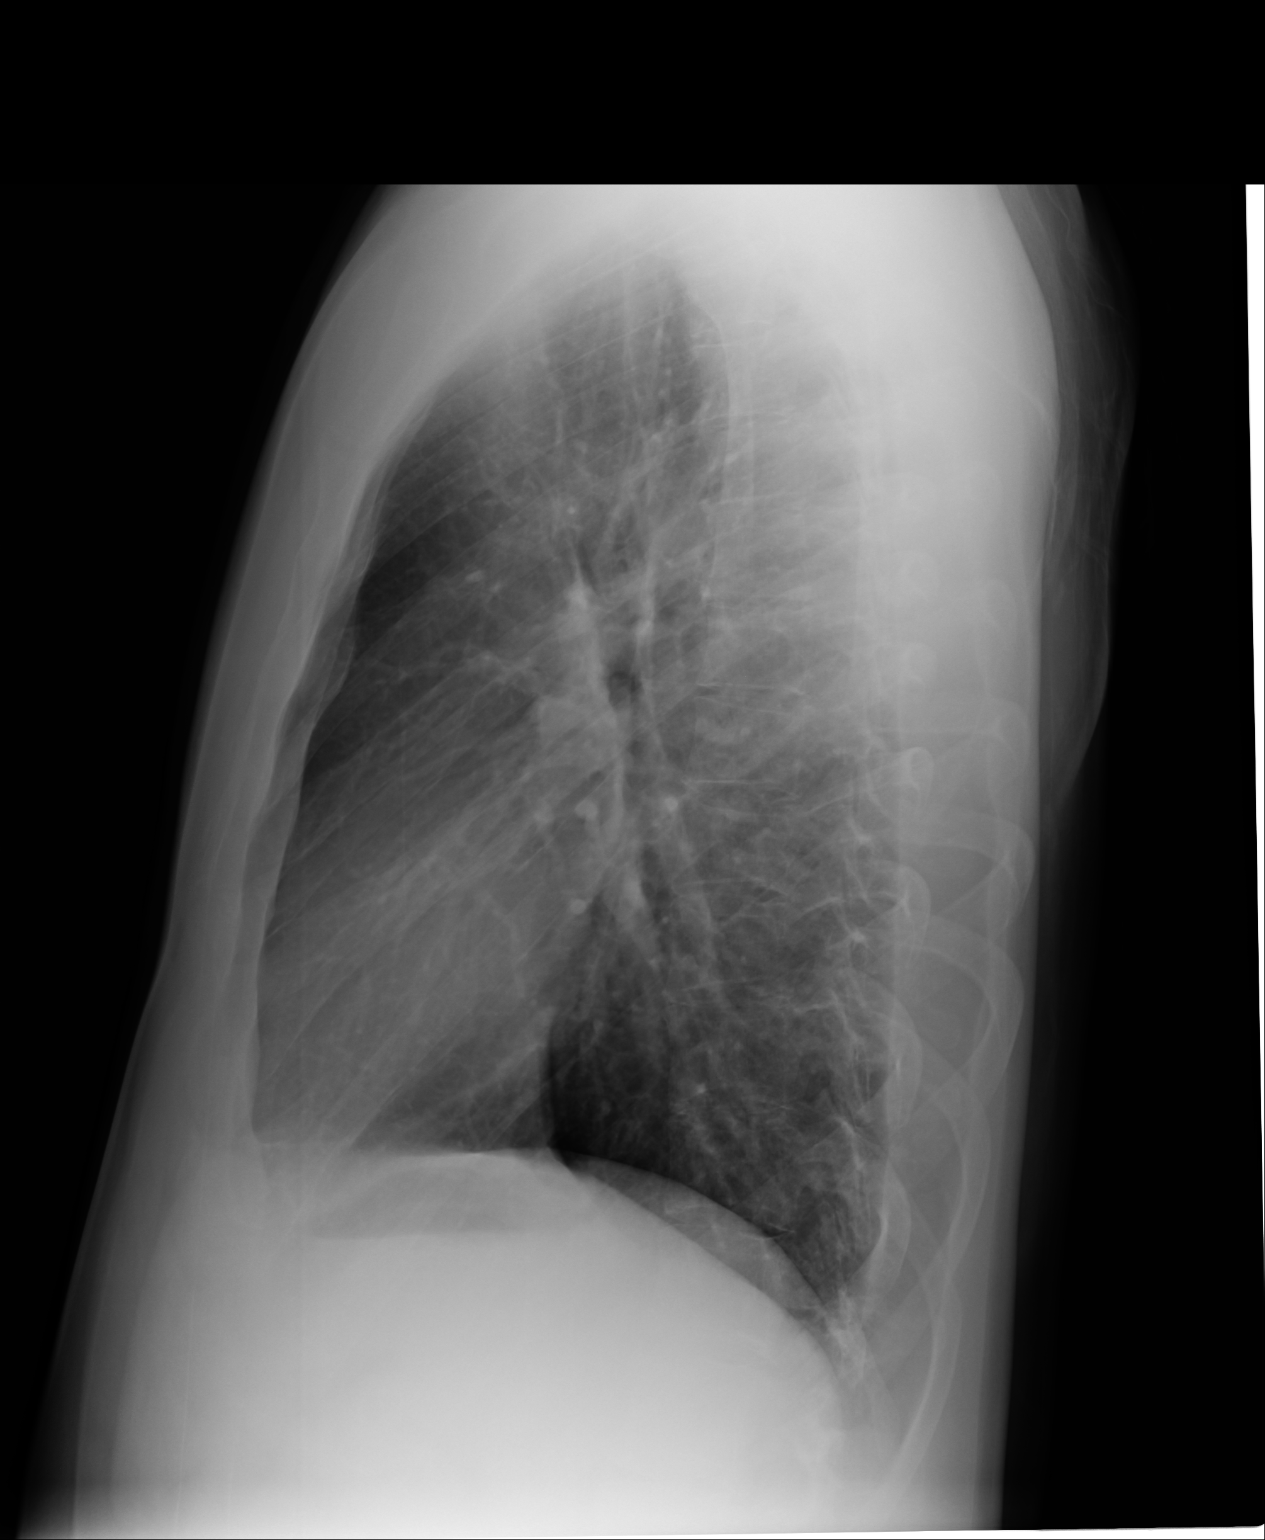

[2 of 2 positions shown; findings below may reference images not displayed]

FINDINGS: Cardiac shadow is stable. The lungs are clear bilaterally. No acute
bony abnormality is seen.
IMPRESSION: No acute abnormality noted.

## 2015-09-25 ENCOUNTER — Ambulatory Visit: Payer: Managed Care, Other (non HMO) | Admitting: Family Medicine

## 2015-10-01 ENCOUNTER — Ambulatory Visit (INDEPENDENT_AMBULATORY_CARE_PROVIDER_SITE_OTHER): Payer: Managed Care, Other (non HMO) | Admitting: Family Medicine

## 2015-10-01 ENCOUNTER — Encounter: Payer: Self-pay | Admitting: Family Medicine

## 2015-10-01 VITALS — BP 110/60 | HR 75 | Temp 97.7°F | Resp 16 | Wt 168.1 lb

## 2015-10-01 DIAGNOSIS — J309 Allergic rhinitis, unspecified: Secondary | ICD-10-CM

## 2015-10-01 DIAGNOSIS — G4700 Insomnia, unspecified: Secondary | ICD-10-CM

## 2015-10-01 DIAGNOSIS — F330 Major depressive disorder, recurrent, mild: Secondary | ICD-10-CM | POA: Diagnosis not present

## 2015-10-01 DIAGNOSIS — F411 Generalized anxiety disorder: Secondary | ICD-10-CM

## 2015-10-01 DIAGNOSIS — J45991 Cough variant asthma: Secondary | ICD-10-CM | POA: Diagnosis not present

## 2015-10-01 DIAGNOSIS — J3089 Other allergic rhinitis: Secondary | ICD-10-CM

## 2015-10-01 DIAGNOSIS — J302 Other seasonal allergic rhinitis: Secondary | ICD-10-CM

## 2015-10-01 MED ORDER — PAROXETINE HCL 20 MG PO TABS: ORAL_TABLET | ORAL | Status: DC

## 2015-10-01 MED ORDER — LORATADINE 10 MG PO TABS: 10.0000 mg | ORAL_TABLET | Freq: Every day | ORAL | Status: DC
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
## 2015-10-17 ENCOUNTER — Emergency Department
Admission: EM | Admit: 2015-10-17 | Discharge: 2015-10-17 | Disposition: A | Discharge status: Home / Self Care | Payer: Worker's Compensation | Attending: Emergency Medicine | Admitting: Emergency Medicine

## 2015-10-17 DIAGNOSIS — J45909 Unspecified asthma, uncomplicated: Secondary | ICD-10-CM | POA: Insufficient documentation

## 2015-10-17 DIAGNOSIS — F1099 Alcohol use, unspecified with unspecified alcohol-induced disorder: Secondary | ICD-10-CM | POA: Diagnosis not present

## 2015-10-17 DIAGNOSIS — Z7721 Contact with and (suspected) exposure to potentially hazardous body fluids: Secondary | ICD-10-CM | POA: Insufficient documentation

## 2015-10-17 DIAGNOSIS — K219 Gastro-esophageal reflux disease without esophagitis: Secondary | ICD-10-CM | POA: Insufficient documentation

## 2015-10-17 DIAGNOSIS — D179 Benign lipomatous neoplasm, unspecified: Secondary | ICD-10-CM | POA: Insufficient documentation

## 2015-10-17 DIAGNOSIS — F333 Major depressive disorder, recurrent, severe with psychotic symptoms: Secondary | ICD-10-CM | POA: Insufficient documentation

## 2015-10-17 DIAGNOSIS — F411 Generalized anxiety disorder: Secondary | ICD-10-CM | POA: Diagnosis not present

## 2015-10-17 DIAGNOSIS — Z79899 Other long term (current) drug therapy: Secondary | ICD-10-CM | POA: Insufficient documentation

## 2015-10-17 LAB — BASIC METABOLIC PANEL
ANION GAP: 5 (ref 5–15)
BUN: 13 mg/dL (ref 6–20)
CALCIUM: 9.9 mg/dL (ref 8.9–10.3)
CO2: 29 mmol/L (ref 22–32)
Chloride: 103 mmol/L (ref 101–111)
Creatinine, Ser: 0.89 mg/dL (ref 0.61–1.24)
GFR calc Af Amer: >60 mL/min (ref >60)
Glucose, Bld: 101 mg/dL — ABNORMAL HIGH (ref 65–99)
POTASSIUM: 3.9 mmol/L (ref 3.5–5.1)
SODIUM: 137 mmol/L (ref 135–145)

## 2015-10-17 LAB — CBC WITH DIFFERENTIAL/PLATELET
BASOS ABS: 0 10*3/uL (ref 0–0.1)
Basophils Relative: 0 %
EOS ABS: 0.1 10*3/uL (ref 0–0.7)
EOS PCT: 1 %
HCT: 46.2 % (ref 40.0–52.0)
Hemoglobin: 16 g/dL (ref 13.0–18.0)
Lymphocytes Relative: 11 %
Lymphs Abs: 1.4 10*3/uL (ref 1.0–3.6)
MCH: 29.8 pg (ref 26.0–34.0)
MCHC: 34.6 g/dL (ref 32.0–36.0)
MCV: 86.1 fL (ref 80.0–100.0)
Monocytes Absolute: 0.8 10*3/uL (ref 0.2–1.0)
Monocytes Relative: 6 %
Neutro Abs: 10.5 10*3/uL — ABNORMAL HIGH (ref 1.4–6.5)
Neutrophils Relative %: 82 %
PLATELETS: 259 10*3/uL (ref 150–440)
RBC: 5.37 MIL/uL (ref 4.40–5.90)
RDW: 13.2 % (ref 11.5–14.5)
WBC: 12.7 10*3/uL — AB (ref 3.8–10.6)

## 2015-10-17 NOTE — ED Notes (Signed)
[facility] tubes and paperwork walked to lab by [employer] and given to [employer]
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
## 2015-10-17 NOTE — Discharge Instructions (Signed)
Body Fluid Exposure Information  People may come into contact with blood and other body fluids under various circumstances. In some cases, body fluids may contain germs (bacteria or viruses) that cause infections. These germs can be spread when another person's body fluids come into contact with your skin, mouth, eyes, or genitals.   Exposure to body fluids that may contain infectious material is a common problem for people providing care for others who are ill. It can occur when a person is performing health care tasks in the workplace or when taking care of a family member at home. Other common methods of exposure include injection drug use, sharing needles, and sexual activity.  The risk of an infection spreading through body fluid exposure is small and depends on a variety of factors. This includes the type of body fluid, the nature of the exposure, and the health status of the person who was the source of the body fluids. Your health care provider can help you assess the risk.  WHAT TYPES OF BODY FLUID CAN SPREAD INFECTION?  The following types of body fluid have the potential to spread infections:   Blood.   Semen.   Vaginal secretions.   Urine.   Feces.   Saliva.   Nasal or eye discharge.   Breast milk.   Amniotic fluid and fluids surrounding body organs.  WHAT ARE SOME FIRST-AID MEASURES FOR BODY FLUID EXPOSURE?  The following steps should be taken as soon as possible after a person is exposed to body fluids:  Intact Skin   For contact with closed skin, wash the area with soap and water.  Broken Skin   For contact with broken skin (a wound), wash the area with soap and water. Let the area bleed a little. Then place a bandage or clean towel on the wound, applying gentle pressure to stop the bleeding. Do not squeeze or rub the area.   Use just water or hand sanitizer if a sink with soap is not available.   Do not use harsh chemicals such as bleach or iodine.  Eyes   Rinse the eyes with water or  saline for 30 seconds.   If the person is wearing contact lenses, leave the contact lenses in while rinsing the eyes. Once the rinsing is complete, remove the contact lenses.  Mouth   Spit out the fluids. Rinse and spit with water 4-5 times.  In addition, you should remove any clothing that comes into contact with body fluids. However, if body fluid exposure results from sexual assault, seek medical care immediately without changing clothes or bathing.  WHEN SHOULD YOU SEEK HELP?  After performing the proper first-aid steps, you should contact your health care provider or seek emergency care right away if blood or other body fluids made contact with areas of broken skin or openings such as the eyes or mouth. If the exposure to body fluid happened in the workplace, you should report it to your work supervisor immediately. Many workplaces have procedures in place for exposure situations.  WHAT WILL HAPPEN AFTER YOU REPORT THE EXPOSURE?  Your health care provider will ask you several questions. Information requested may include:   Your medical history, including vaccination records.   Date and time of the exposure.   Whether you saw body fluids during the exposure.   Type of body fluid you were exposed to.   Volume of body fluid you were exposed to.   How the exposure happened.   If any devices,   such as a needle, were being used.   Which area of your body made contact with the body fluid.   Description of any injury to the skin or other area.   How long contact was made with the body fluid.   Any information you have about the health status of the person whose body fluid you were exposed to.  The health care provider will assess your risk of infection. Often, no treatment is necessary. In some cases, the health care provider may recommend doing blood tests right away. Follow-up blood tests may also be done at certain intervals during the upcoming weeks and months to check for changes. You may be offered  treatment to prevent an infection from developing after exposure (post-exposure prophylaxis). This may include certain vaccinations or medicines and may be necessary when there is a risk of a serious infection, such as HIV or hepatitis B. Your health care provider should discuss appropriate treatment and vaccinations with you.  HOW CAN YOU PREVENT EXPOSURE AND INFECTION?  Always remember that prevention is the first line of defense against body fluid exposure. To help prevent exposure to body fluids:   Wash and disinfect countertops and other surfaces regularly.   Wear appropriate protective gear such as gloves, gowns, or eyewear when the possibility of exposure is present.   Wipe away spills of body fluid with disposable towels.   Properly dispose of blood products and other fluids. Use secured bags.   Properly dispose of needles and other instruments with sharp points or edges (sharps). Use closed, marked containers.   Avoid injection drug use.   Do not share needles.   Avoid recapping needles.   Use a condom during sexual intercourse.   Make sure you learn and follow any guidelines for preventing exposure (universal precautions) provided at your workplace.  To help reduce your chances of getting an infection:   Make sure your vaccinations are up-to-date, including those for tetanus and hepatitis.   Wash your hands frequently with soap and water. Use hand sanitizers.   Avoid having multiple sex partners.   Follow up with your health care provider as directed after being evaluated for an exposure to body fluids.  To avoid spreading infection to others:   Do not have sexual relations until you know you are free of infection.   Do not donate blood, plasma, breast milk, sperm, or other body fluids.   Do not share hygiene tools such as toothbrushes, razors, or dental floss.   Keep open wounds covered.   Dispose of any items with blood on them (razors, tampons, bandages) by putting them in the  trash.   Do not share drug supplies with others, such as needles, syringes, straws, or pipes.   Follow all of your health care provider's instructions for preventing the spread of infection.     This information is not intended to replace advice given to you by your health care provider. Make sure you discuss any questions you have with your health care provider.     Document Released: 03/08/2013 Document Revised: 07/11/2013 Document Reviewed: 03/08/2013  Elsevier Interactive Patient Education 2016 [employer]

## 2015-10-19 LAB — HIV ANTIBODY (ROUTINE TESTING W REFLEX): HIV Screen 4th Generation wRfx: NONREACTIVE

## 2015-10-19 LAB — HEPATITIS B SURFACE ANTIBODY,QUALITATIVE: HEP B S AB: REACTIVE

## 2015-10-19 LAB — HCV COMMENT:

## 2015-10-19 LAB — HEPATITIS C ANTIBODY (REFLEX): HCV Ab: <0.1 s/co ratio (ref 0.0–0.9)

## 2016-01-02 ENCOUNTER — Ambulatory Visit (INDEPENDENT_AMBULATORY_CARE_PROVIDER_SITE_OTHER): Payer: Managed Care, Other (non HMO) | Admitting: Family Medicine

## 2016-01-02 ENCOUNTER — Encounter: Payer: Self-pay | Admitting: Family Medicine

## 2016-01-02 VITALS — BP 112/82 | HR 63 | Temp 98.1°F | Resp 16 | Ht 66.0 in | Wt 164.5 lb

## 2016-01-02 DIAGNOSIS — R739 Hyperglycemia, unspecified: Secondary | ICD-10-CM

## 2016-01-02 DIAGNOSIS — E785 Hyperlipidemia, unspecified: Secondary | ICD-10-CM | POA: Diagnosis not present

## 2016-01-02 DIAGNOSIS — D72829 Elevated white blood cell count, unspecified: Secondary | ICD-10-CM | POA: Diagnosis not present

## 2016-01-02 DIAGNOSIS — Z0000 Encounter for general adult medical examination without abnormal findings: Secondary | ICD-10-CM | POA: Diagnosis not present
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
## 2016-01-02 NOTE — Addendum Note (Signed)
Addended by: Steele Sizer F on: 01/02/2016 10:30 AM   Modules accepted: Miquel Dunn

## 2016-01-03 LAB — CBC WITH DIFFERENTIAL/PLATELET
BASOS: 1 %
Basophils Absolute: 0 10*3/uL (ref 0.0–0.2)
EOS (ABSOLUTE): 0.2 10*3/uL (ref 0.0–0.4)
Eos: 3 %
HEMATOCRIT: 46.1 % (ref 37.5–51.0)
Hemoglobin: 16.1 g/dL (ref 12.6–17.7)
IMMATURE GRANS (ABS): 0 10*3/uL (ref 0.0–0.1)
Immature Granulocytes: 0 %
LYMPHS: 26 %
Lymphocytes Absolute: 1.5 10*3/uL (ref 0.7–3.1)
MCH: 30.3 pg (ref 26.6–33.0)
MCHC: 34.9 g/dL (ref 31.5–35.7)
MCV: 87 fL (ref 79–97)
MONOS ABS: 0.6 10*3/uL (ref 0.1–0.9)
Monocytes: 10 %
NEUTROS ABS: 3.5 10*3/uL (ref 1.4–7.0)
NEUTROS PCT: 60 %
PLATELETS: 245 10*3/uL (ref 150–379)
RBC: 5.32 x10E6/uL (ref 4.14–5.80)
RDW: 13 % (ref 12.3–15.4)
WBC: 5.8 10*3/uL (ref 3.4–10.8)

## 2016-01-03 LAB — LIPID PANEL
Chol/HDL Ratio: 5.1 ratio units — ABNORMAL HIGH (ref 0.0–5.0)
Cholesterol, Total: 184 mg/dL (ref 100–199)
HDL: 36 mg/dL — ABNORMAL LOW (ref >39)
LDL Calculated: 114 mg/dL — ABNORMAL HIGH (ref 0–99)
Triglycerides: 170 mg/dL — ABNORMAL HIGH (ref 0–149)
VLDL Cholesterol Cal: 34 mg/dL (ref 5–40)

## 2016-01-03 LAB — HEMOGLOBIN A1C
ESTIMATED AVERAGE GLUCOSE: 105 mg/dL
Hgb A1c MFr Bld: 5.3 % (ref 4.8–5.6)

## 2016-01-20 ENCOUNTER — Ambulatory Visit: Payer: Managed Care, Other (non HMO)

## 2016-01-22 ENCOUNTER — Ambulatory Visit (INDEPENDENT_AMBULATORY_CARE_PROVIDER_SITE_OTHER): Payer: Managed Care, Other (non HMO)

## 2016-01-22 DIAGNOSIS — Z23 Encounter for immunization: Secondary | ICD-10-CM

## 2016-02-18 ENCOUNTER — Encounter: Payer: Self-pay | Admitting: Family Medicine

## 2016-02-18 ENCOUNTER — Ambulatory Visit (INDEPENDENT_AMBULATORY_CARE_PROVIDER_SITE_OTHER): Payer: Managed Care, Other (non HMO) | Admitting: Family Medicine

## 2016-02-18 VITALS — BP 116/74 | HR 60 | Temp 98.5°F | Resp 18 | Ht 66.0 in | Wt 165.8 lb

## 2016-02-18 DIAGNOSIS — G4700 Insomnia, unspecified: Secondary | ICD-10-CM

## 2016-02-18 DIAGNOSIS — F330 Major depressive disorder, recurrent, mild: Secondary | ICD-10-CM | POA: Diagnosis not present

## 2016-02-18 DIAGNOSIS — F411 Generalized anxiety disorder: Secondary | ICD-10-CM
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
## 2016-02-25 ENCOUNTER — Telehealth: Payer: Self-pay | Admitting: Family Medicine

## 2016-03-03 ENCOUNTER — Ambulatory Visit: Payer: Managed Care, Other (non HMO) | Admitting: Family Medicine

## 2016-03-03 NOTE — Telephone Encounter (Signed)
Completed.

## 2016-03-09 ENCOUNTER — Encounter: Payer: Self-pay | Admitting: Family Medicine

## 2016-03-09 ENCOUNTER — Ambulatory Visit (INDEPENDENT_AMBULATORY_CARE_PROVIDER_SITE_OTHER): Payer: Managed Care, Other (non HMO) | Admitting: Family Medicine

## 2016-03-09 VITALS — BP 108/64 | HR 62 | Temp 98.9°F | Resp 16 | Ht 65.0 in | Wt 162.4 lb

## 2016-03-09 DIAGNOSIS — L089 Local infection of the skin and subcutaneous tissue, unspecified: Secondary | ICD-10-CM

## 2016-03-09 DIAGNOSIS — Z789 Other specified health status: Secondary | ICD-10-CM

## 2016-03-09 DIAGNOSIS — Z9189 Other specified personal risk factors, not elsewhere classified: Secondary | ICD-10-CM

## 2016-03-09 DIAGNOSIS — T148 Other injury of unspecified body region: Secondary | ICD-10-CM

## 2016-03-09 DIAGNOSIS — T148XXA Other injury of unspecified body region, initial encounter: Principal | ICD-10-CM

## 2016-03-09 MED ORDER — CEPHALEXIN 500 MG PO CAPS: 500.0000 mg | ORAL_CAPSULE | Freq: Four times a day (QID) | ORAL | 0 refills | Status: DC

## 2016-03-09 NOTE — Progress Notes (Signed)
Name: Anthony Hale   MRN: [id number]    DOB: 1985-10-12   Date:03/09/2016       Progress Note  Subjective  Chief Complaint  Chief Complaint  Patient presents with  . Wound Infection    Patient states he went to a [employer] and got bilateral nipples pierced 3 weeks ago. Patient states just the right nipple has had white pus after showering and redness underneath his areola. Patient states it is very tender when they are hit by his selt belt.     HPI  Nipple piercing: he had both nipples pierced a few weeks ago and about one week ago noticed some pain with pain when he accidentally hit his chest, and also noticed some white drainage. 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 No fever or change in appetite.    Patient Active Problem List   Diagnosis Date Noted  . Depression, major, recurrent, mild ([place]) 08/28/2015  . Generalized anxiety disorder 08/28/2015  . IBS (irritable bowel syndrome) 04/03/2015  . Fatty tumor 12/31/2014  . Decorative tattoo 12/31/2014  . Asthma, cough variant 12/30/2014  . Insomnia 12/30/2014  . GERD (gastroesophageal reflux disease) 12/30/2014  . Perennial allergic rhinitis with seasonal variation 05/23/2007    Past Surgical History:  Procedure Laterality Date  . WISDOM TOOTH EXTRACTION      Family History  Problem Relation Age of Onset  . Cancer Mother     Breast  . Hyperlipidemia Mother   . Heart disease Father   . Hyperlipidemia Father   . Hypertension Father     Social History   Social History  . Marital status: Legally Separated    Spouse name: N/A  . Number of children: N/A  . Years of education: N/A   Occupational History  . police officer    Social History Main Topics  . Smoking status: Never Smoker  . Smokeless tobacco: Never Used  . Alcohol use 0.0 oz/week     Comment: rarely  . Drug use: No  . Sexual activity: Not Currently    Partners: Female   Other Topics Concern  .  Not on file   Social History Narrative   Married on March 19th, 2017, marriage was never consummated and she left on June 25 th, 2017     Current Outpatient Prescriptions:  .  albuterol (PROVENTIL HFA;VENTOLIN HFA) 108 (90 BASE) MCG/ACT inhaler, Inhale 2 puffs into the lungs every 6 (six) hours as needed for wheezing or shortness of breath., Disp: 1 Inhaler, Rfl: 1 .  Amino Acids (L-CARNITINE) LIQD, Take 1,100 mg by mouth daily., Disp: , Rfl:  .  Creatine POWD, 1 scoop by Does not apply route daily., Disp: , Rfl:  .  fluticasone (FLONASE) 50 MCG/ACT nasal spray, Place 2 sprays into both nostrils daily. Reported on 08/28/2015, Disp: 16 g, Rfl: 2 .  Glutamine 5 g PACK, Take 5 g by mouth daily., Disp: , Rfl:  .  hyoscyamine (LEVSIN) 0.125 MG tablet, Take 1 tablet (0.125 mg total) by mouth every 4 (four) hours as needed for cramping., Disp: 30 tablet, Rfl: 2 .  levocetirizine (XYZAL) 5 MG tablet, Take by mouth., Disp: , Rfl:  .  PARoxetine (PAXIL) 20 MG tablet, One daily, Disp: 30 tablet, Rfl: 2 .  ranitidine (ZANTAC) 300 MG tablet, Take 1 tablet (300 mg total) by mouth at bedtime., Disp: 30 tablet, Rfl: 2 .  traZODone (DESYREL) 50 MG tablet, Take 0.5-1  tablets (25-50 mg total) by mouth at bedtime as needed for sleep., Disp: 30 tablet, Rfl: 2 .  Whey Protein Concentrate POWD, Take 1 each by mouth 2 (two) times daily., Disp: , Rfl:   No Known Allergies   ROS  Ten systems reviewed and is negative except as mentioned in HPI   Objective  Vitals:   03/09/16 1233  BP: 108/64  Pulse: 62  Resp: 16  Temp: 98.9 F (37.2 C)  TempSrc: Oral  SpO2: 99%  Weight: 162 lb 6.4 oz (73.7 kg)  Height: 5\' 5"  (1.651 m)    Body mass index is 27.02 kg/m.  Physical Exam  Constitutional: Patient appears well-developed and well-nourished.  No distress.  HEENT: head atraumatic, normocephalic, pupils equal and reactive to light,  neck supple, throat within normal limits Cardiovascular: Normal rate,  regular rhythm and normal heart sounds.  No murmur heard. No BLE edema. Pulmonary/Chest: Effort normal and breath sounds normal. No respiratory distress. Abdominal: Soft.  There is no tenderness. Skin: piercing both nipples and also tattoos. Right nipple slightly tender, no redness, mild white discharge, swab used to send for culture Psychiatric: Patient has a normal mood and affect. behavior is normal. Judgment and thought content normal.  Recent Results (from the past 2160 hour(s))  Lipid panel     Status: Abnormal   Collection Time: 01/02/16  8:51 AM  Result Value Ref Range   Cholesterol, Total 184 100 - 199 mg/dL   Triglycerides 170 (H) 0 - 149 mg/dL   HDL 36 (L) >39 mg/dL   VLDL Cholesterol Cal 34 5 - 40 mg/dL   LDL Calculated 114 (H) 0 - 99 mg/dL   Chol/HDL Ratio 5.1 (H) 0.0 - 5.0 ratio units    Comment:                                   T. Chol/HDL Ratio                                             Men  Women                               1/2 Avg.Risk  3.4    3.3                                   Avg.Risk  5.0    4.4                                2X Avg.Risk  9.6    7.1                                3X Avg.Risk 23.4   11.0   Hemoglobin A1c     Status: None   Collection Time: 01/02/16  8:51 AM  Result Value Ref Range   Hgb A1c MFr Bld 5.3 4.8 - 5.6 %    Comment:          Pre-diabetes: 5.7 - 6.4  Diabetes: >6.4          Glycemic control for adults with diabetes: <7.0    Est. average glucose Bld gHb Est-mCnc 105 mg/dL  CBC with Differential/Platelet     Status: None   Collection Time: 01/02/16  8:51 AM  Result Value Ref Range   WBC 5.8 3.4 - 10.8 x10E3/uL   RBC 5.32 4.14 - 5.80 x10E6/uL   Hemoglobin 16.1 12.6 - 17.7 g/dL   Hematocrit 46.1 37.5 - 51.0 %   MCV 87 79 - 97 fL   MCH 30.3 26.6 - 33.0 pg   MCHC 34.9 31.5 - 35.7 g/dL   RDW 13.0 12.3 - 15.4 %   Platelets 245 150 - 379 x10E3/uL   Neutrophils 60 %   Lymphs 26 %   Monocytes 10 %   Eos 3 %   Basos 1 %    Neutrophils Absolute 3.5 1.4 - 7.0 x10E3/uL   Lymphocytes Absolute 1.5 0.7 - 3.1 x10E3/uL   Monocytes Absolute 0.6 0.1 - 0.9 x10E3/uL   EOS (ABSOLUTE) 0.2 0.0 - 0.4 x10E3/uL   Basophils Absolute 0.0 0.0 - 0.2 x10E3/uL   Immature Granulocytes 0 %   Immature Grans (Abs) 0.0 0.0 - 0.1 x10E3/uL    PHQ2/9: Depression screen [facility] 2/9 02/18/2016 01/02/2016 10/01/2015 08/28/2015 04/03/2015  Decreased Interest 2 0 0 1 0  Down, Depressed, Hopeless 2 0 0 2 0  PHQ - 2 Score 4 0 0 3 0  Altered sleeping 3 - - 3 -  Tired, decreased energy 1 - - 3 -  Change in appetite 0 - - 0 -  Feeling bad or failure about yourself  0 - - 1 -  Trouble concentrating 1 - - 3 -  Moving slowly or fidgety/restless 0 - - 0 -  Suicidal thoughts 0 - - 0 -  PHQ-9 Score 9 - - 13 -  Difficult doing work/chores Somewhat difficult - - - -   He has an appointment in [place]: Fall Risk  01/02/2016 10/01/2015 08/28/2015 04/03/2015 12/31/2014  Falls in the past year? No No No No No      Assessment & Plan  1. Infected wound ([place])  - cephALEXin (KEFLEX) 500 MG capsule; Take 1 capsule (500 mg total) by mouth 4 (four) times daily.  Dispense: 28 capsule; Refill: 0 Advised to keep it clean, remove piercing and use warm compresses to help drain the pus  2. Body piercing

## 2016-03-09 NOTE — Addendum Note (Signed)
Addended by: Inda Coke on: 03/09/2016 04:33 PM   Modules accepted: Orders

## 2016-03-12 LAB — WOUND CULTURE: Organism ID, Bacteria: NONE SEEN

## 2016-03-27 ENCOUNTER — Other Ambulatory Visit: Payer: Self-pay | Admitting: Family Medicine

## 2016-03-27 DIAGNOSIS — F411 Generalized anxiety disorder: Secondary | ICD-10-CM

## 2016-03-27 DIAGNOSIS — F330 Major depressive disorder, recurrent, mild: Secondary | ICD-10-CM

## 2016-03-27 NOTE — Telephone Encounter (Signed)
Patient requesting refill of Paxil sent to [facility].

## 2016-03-30 NOTE — Telephone Encounter (Signed)
Patient informed and appointment made for 05/06/16

## 2016-03-31 ENCOUNTER — Ambulatory Visit: Payer: Managed Care, Other (non HMO) | Admitting: Family Medicine

## 2016-05-06 ENCOUNTER — Ambulatory Visit: Payer: Managed Care, Other (non HMO) | Admitting: Family Medicine

## 2016-05-26 ENCOUNTER — Ambulatory Visit: Payer: Managed Care, Other (non HMO) | Admitting: Family Medicine

## 2016-05-27 ENCOUNTER — Other Ambulatory Visit: Payer: Self-pay | Admitting: Family Medicine

## 2016-05-27 DIAGNOSIS — F330 Major depressive disorder, recurrent, mild: Secondary | ICD-10-CM

## 2016-05-27 DIAGNOSIS — F411 Generalized anxiety disorder: Secondary | ICD-10-CM

## 2016-06-10 ENCOUNTER — Ambulatory Visit: Payer: Self-pay | Admitting: Physician Assistant

## 2016-06-10 ENCOUNTER — Encounter: Payer: Self-pay | Admitting: Physician Assistant

## 2016-06-10 VITALS — BP 132/85 | HR 62 | Temp 98.8°F

## 2016-06-10 DIAGNOSIS — J029 Acute pharyngitis, unspecified: Secondary | ICD-10-CM

## 2016-06-10 LAB — POCT RAPID STREP A (OFFICE): RAPID STREP A SCREEN: NEGATIVE

## 2016-06-10 MED ORDER — METHYLPREDNISOLONE 4 MG PO TBPK: ORAL_TABLET | ORAL | 0 refills | Status: DC

## 2016-06-10 MED ORDER — AMOXICILLIN 875 MG PO TABS: 875.0000 mg | ORAL_TABLET | Freq: Two times a day (BID) | ORAL | 0 refills | Status: DC

## 2016-06-10 NOTE — Progress Notes (Signed)
S: C/o runny nose and congestion with sore throat for 3 days, no fever, chills, cp/sob, v/d; mucus is green and thick, cough is sporadic, states his brother had strep and didn't finish his meds  Using otc meds:   O: PE: vitals wnl, nad, perrl eomi, normocephalic, tms dull, nasal mucosa red and swollen, throat red and swollen, , neck supple no lymph, lungs c t a, cv rrr, neuro intact. q strep neg  A:  Acute pharyngitis   P: drink fluids, continue regular meds , use otc meds of choice, return if not improving in 5 days, return earlier if worsening , amoxil 875, medrol dose pack if swelling gets worse

## 2016-07-09 ENCOUNTER — Other Ambulatory Visit: Payer: Self-pay | Admitting: Family Medicine

## 2016-07-09 DIAGNOSIS — F330 Major depressive disorder, recurrent, mild: Secondary | ICD-10-CM

## 2016-07-09 DIAGNOSIS — F411 Generalized anxiety disorder: Secondary | ICD-10-CM

## 2016-08-14 ENCOUNTER — Ambulatory Visit (INDEPENDENT_AMBULATORY_CARE_PROVIDER_SITE_OTHER): Payer: Managed Care, Other (non HMO) | Admitting: Family Medicine

## 2016-08-14 ENCOUNTER — Encounter: Payer: Self-pay | Admitting: Family Medicine

## 2016-08-14 VITALS — BP 128/74 | HR 60 | Temp 98.2°F | Resp 16 | Ht 65.0 in | Wt 166.1 lb

## 2016-08-14 DIAGNOSIS — F411 Generalized anxiety disorder: Secondary | ICD-10-CM | POA: Diagnosis not present

## 2016-08-14 DIAGNOSIS — E785 Hyperlipidemia, unspecified: Secondary | ICD-10-CM

## 2016-08-14 DIAGNOSIS — R739 Hyperglycemia, unspecified: Secondary | ICD-10-CM | POA: Diagnosis not present

## 2016-08-14 DIAGNOSIS — Z23 Encounter for immunization: Secondary | ICD-10-CM

## 2016-08-14 DIAGNOSIS — F330 Major depressive disorder, recurrent, mild: Secondary | ICD-10-CM | POA: Diagnosis not present

## 2016-08-14 DIAGNOSIS — G4709 Other insomnia: Secondary | ICD-10-CM

## 2016-08-14 DIAGNOSIS — J45991 Cough variant asthma: Secondary | ICD-10-CM

## 2016-08-14 MED ORDER — PAROXETINE HCL 20 MG PO TABS: 20.0000 mg | ORAL_TABLET | Freq: Every day | ORAL | 1 refills | Status: DC

## 2016-08-14 NOTE — Progress Notes (Signed)
Name: Anthony Hale   MRN: [id number]    DOB: 07/27/1985   Date:08/14/2016       Progress Note  Subjective  Chief Complaint  Chief Complaint  Patient presents with  . Medication Refill  . Depression  . Flu Vaccine    refused    HPI  Major Depression/GAD: he has a long history of depression and anxiety, but worse since Feb 2017 when he was having family conflict over his wedding, followed by a failed marriage and separation. He is doing better, seeing a therapist . Dr. Harle Stanford in [place] once a month, taking Paxil and still has some lack of energy and sadness at times, but feels well most of the time. No crying spells or suicidal thoughts. Seeing someone again. He states he was feeling down Christmas but better now  Hyperglycemia: he denies polyphagia, polyuria or polydipsia  GERD: well controlled, only taking medication prn  Asthma mild intermittent: he states no cough, he has some SOB with activity, advised to use rescue inhaler before activity  Insomnia: he wakes up because of his dog, but not taking Trazodone lately.    Patient Active Problem List   Diagnosis Date Noted  . Depression, major, recurrent, mild ([place]) 08/28/2015  . Generalized anxiety disorder 08/28/2015  . IBS (irritable bowel syndrome) 04/03/2015  . Fatty tumor 12/31/2014  . Decorative tattoo 12/31/2014  . Asthma, cough variant 12/30/2014  . Insomnia 12/30/2014  . GERD (gastroesophageal reflux disease) 12/30/2014  . Perennial allergic rhinitis with seasonal variation 05/23/2007    Past Surgical History:  Procedure Laterality Date  . WISDOM TOOTH EXTRACTION      Family History  Problem Relation Age of Onset  . Cancer Mother     Breast  . Hyperlipidemia Mother   . Heart disease Father   . Hyperlipidemia Father   . Hypertension Father     Social History   Social History  . Marital status: Legally Separated    Spouse name: N/A  . Number of children: N/A  . Years of education: N/A    Occupational History  . police officer    Social History Main Topics  . Smoking status: Never Smoker  . Smokeless tobacco: Never Used  . Alcohol use 0.0 oz/week     Comment: rarely  . Drug use: No  . Sexual activity: Not Currently    Partners: Female   Other Topics Concern  . Not on file   Social History Narrative   Married on March 19th, 2017, marriage was never consummated and she left on June 25 th, 2017     Current Outpatient Prescriptions:  .  albuterol (PROVENTIL HFA;VENTOLIN HFA) 108 (90 BASE) MCG/ACT inhaler, Inhale 2 puffs into the lungs every 6 (six) hours as needed for wheezing or shortness of breath., Disp: 1 Inhaler, Rfl: 1 .  Amino Acids (L-CARNITINE) LIQD, Take 1,100 mg by mouth daily., Disp: , Rfl:  .  Creatine POWD, 1 scoop by Does not apply route daily., Disp: , Rfl:  .  fluticasone (FLONASE) 50 MCG/ACT nasal spray, Place 2 sprays into both nostrils daily. Reported on 08/28/2015, Disp: 16 g, Rfl: 2 .  Glutamine 5 g PACK, Take 5 g by mouth daily., Disp: , Rfl:  .  hyoscyamine (LEVSIN) 0.125 MG tablet, Take 1 tablet (0.125 mg total) by mouth every 4 (four) hours as needed for cramping., Disp: 30 tablet, Rfl: 2 .  levocetirizine (XYZAL) 5 MG tablet, Take by mouth., Disp: , Rfl:  .  PARoxetine (PAXIL) 20 MG tablet, Take 1 tablet (20 mg total) by mouth daily., Disp: 90 tablet, Rfl: 1 .  ranitidine (ZANTAC) 300 MG tablet, Take 1 tablet (300 mg total) by mouth at bedtime., Disp: 30 tablet, Rfl: 2 .  traZODone (DESYREL) 50 MG tablet, Take 0.5-1 tablets (25-50 mg total) by mouth at bedtime as needed for sleep., Disp: 30 tablet, Rfl: 2 .  Whey Protein Concentrate POWD, Take 1 each by mouth 2 (two) times daily., Disp: , Rfl:   No Known Allergies   ROS  Constitutional: Negative for fever or weight change.  Respiratory: Negative for cough and shortness of breath.   Cardiovascular: Negative for chest pain or palpitations.  Gastrointestinal: Negative for abdominal pain,  no bowel changes.  Musculoskeletal: Negative for gait problem or joint swelling.  Skin: Negative for rash.  Neurological: Negative for dizziness or headache.  No other specific complaints in a complete review of systems (except as listed in HPI above).  Objective  Vitals:   08/14/16 1143  BP: 128/74  Pulse: 60  Resp: 16  Temp: 98.2 F (36.8 C)  SpO2: 95%  Weight: 166 lb 2 oz (75.4 kg)  Height: 5\' 5"  (1.651 m)    Body mass index is 27.64 kg/m.  Physical Exam  Constitutional: Patient appears well-developed and well-nourished.  No distress.  HEENT: head atraumatic, normocephalic, pupils equal and reactive to light,  neck supple, throat within normal limits Cardiovascular: Normal rate, regular rhythm and normal heart sounds.  No murmur heard. No BLE edema. Pulmonary/Chest: Effort normal and breath sounds normal. No respiratory distress. Abdominal: Soft.  There is no tenderness. Psychiatric: Patient has a normal mood and affect. behavior is normal. Judgment and thought content normal.  Recent Results (from the past 2160 hour(s))  POCT Rapid Strep A-Office (CPT [phone])     Status: Normal   Collection Time: 06/10/16 10:16 AM  Result Value Ref Range   Rapid Strep A Screen Negative Negative      PHQ2/9: Depression screen [facility] 2/9 08/14/2016 02/18/2016 01/02/2016 10/01/2015 08/28/2015  Decreased Interest 1 2 0 0 1  Down, Depressed, Hopeless 1 2 0 0 2  PHQ - 2 Score 2 4 0 0 3  Altered sleeping 1 3 - - 3  Tired, decreased energy 0 1 - - 3  Change in appetite 0 0 - - 0  Feeling bad or failure about yourself  0 0 - - 1  Trouble concentrating 0 1 - - 3  Moving slowly or fidgety/restless 0 0 - - 0  Suicidal thoughts 0 0 - - 0  PHQ-9 Score 3 9 - - 13  Difficult doing work/chores Somewhat difficult Somewhat difficult - - -    Fall Risk: Fall Risk  01/02/2016 10/01/2015 08/28/2015 04/03/2015 12/31/2014  Falls in the past year? No No No No No      Assessment & Plan  1. Depression,  major, recurrent, mild ([facility])  - PARoxetine (PAXIL) 20 MG tablet; Take 1 tablet (20 mg total) by mouth daily.  Dispense: 90 tablet; Refill: 1 Seeing Dr. Gaylan Gerold - therapist and is helping him   2. Generalized anxiety disorder  - PARoxetine (PAXIL) 20 MG tablet; Take 1 tablet (20 mg total) by mouth daily.  Dispense: 90 tablet; Refill: 1  3. Other insomnia  Doing well, still has trazodone at home  4. Hyperglycemia  Check labs next visit   5. Dyslipidemia  Recheck during CPE  6. Asthma, cough variant  Doing well, no current  symptoms  7. Needs flu shot  - Flu Vaccine QUAD 36+ mos IM

## 2016-11-11 ENCOUNTER — Encounter: Payer: Self-pay | Admitting: Family Medicine

## 2016-11-11 ENCOUNTER — Ambulatory Visit (INDEPENDENT_AMBULATORY_CARE_PROVIDER_SITE_OTHER): Payer: Managed Care, Other (non HMO) | Admitting: Family Medicine

## 2016-11-11 VITALS — BP 118/78 | HR 70 | Temp 98.9°F | Resp 16 | Ht 65.0 in | Wt 180.1 lb

## 2016-11-11 DIAGNOSIS — M7542 Impingement syndrome of left shoulder: Secondary | ICD-10-CM

## 2016-11-11 DIAGNOSIS — J3089 Other allergic rhinitis: Secondary | ICD-10-CM | POA: Diagnosis not present

## 2016-11-11 DIAGNOSIS — F330 Major depressive disorder, recurrent, mild: Secondary | ICD-10-CM | POA: Diagnosis not present

## 2016-11-11 DIAGNOSIS — G4709 Other insomnia: Secondary | ICD-10-CM

## 2016-11-11 DIAGNOSIS — J45991 Cough variant asthma: Secondary | ICD-10-CM

## 2016-11-11 DIAGNOSIS — J302 Other seasonal allergic rhinitis: Secondary | ICD-10-CM | POA: Diagnosis not present

## 2016-11-11 DIAGNOSIS — F411 Generalized anxiety disorder: Secondary | ICD-10-CM | POA: Diagnosis not present

## 2016-11-11 MED ORDER — LEVOCETIRIZINE DIHYDROCHLORIDE 5 MG PO TABS: 5.0000 mg | ORAL_TABLET | Freq: Every evening | ORAL | 0 refills | Status: DC

## 2016-11-11 MED ORDER — MONTELUKAST SODIUM 10 MG PO TABS: 10.0000 mg | ORAL_TABLET | Freq: Every day | ORAL | 3 refills | Status: DC

## 2016-11-11 MED ORDER — BUSPIRONE HCL 5 MG PO TABS: 5.0000 mg | ORAL_TABLET | Freq: Two times a day (BID) | ORAL | 0 refills | Status: DC

## 2016-11-11 MED ORDER — AZELASTINE-FLUTICASONE 137-50 MCG/ACT NA SUSP: 1.0000 | Freq: Two times a day (BID) | NASAL | 2 refills | Status: DC

## 2016-11-11 NOTE — Progress Notes (Signed)
Name: Anthony Hale   MRN: [id number]    DOB: 05/21/1986   Date:11/11/2016       Progress Note  Subjective  Chief Complaint  Chief Complaint  Patient presents with  . Insomnia    medication not working he wakes up during the night   . Allergies    xyzal is not currently strong enough to control symptoms    HPI    Major Depression/GAD: he has a long history of depression and anxiety, but worse since Feb 2017 when he was having family conflict over his wedding, followed by a failed marriage and separation. He is doing better, no longer seeing therapist. He is taking Paxil and still has some lack of energy but does not feel sad anymore. No crying spells or suicidal thoughts. He is dating again. Work is a [employer] of stress. He states difficulty falling and staying asleep at night because he feels overwhelmed or re-thinking what happened at work, or anticipating the following day.   Hyperglycemia: he denies polyphagia, polyuria or polydipsia  GERD: well controlled, only taking medication prn  Asthma mild intermittent: he states cough has increased since Spring has started. He has itchy nose, itchy eyes. He is on Xyzal once a day, but not currently using nasal spray. He denies wheezing or SOB.   Shoulder left pain: he was at the gym doing bench presses and developed left shoulder pain, it is painful to abduct left shoulder. It happened about one month ago, symptoms are not getting worse, just uncomfortable, he is avoiding activities that aggravate symptoms  Patient Active Problem List   Diagnosis Date Noted  . Depression, major, recurrent, mild ([place]) 08/28/2015  . Generalized anxiety disorder 08/28/2015  . IBS (irritable bowel syndrome) 04/03/2015  . Fatty tumor 12/31/2014  . Decorative tattoo 12/31/2014  . Asthma, cough variant 12/30/2014  . Insomnia 12/30/2014  . GERD (gastroesophageal reflux disease) 12/30/2014  . Perennial allergic rhinitis with seasonal variation 05/23/2007     Past Surgical History:  Procedure Laterality Date  . WISDOM TOOTH EXTRACTION      Family History  Problem Relation Age of Onset  . Cancer Mother     Breast  . Hyperlipidemia Mother   . Heart disease Father   . Hyperlipidemia Father   . Hypertension Father     Social History   Social History  . Marital status: Legally Separated    Spouse name: N/A  . Number of children: N/A  . Years of education: N/A   Occupational History  . police officer    Social History Main Topics  . Smoking status: Never Smoker  . Smokeless tobacco: Never Used  . Alcohol use 0.0 oz/week     Comment: rarely  . Drug use: No  . Sexual activity: Not Currently    Partners: Female   Other Topics Concern  . Not on file   Social History Narrative   Married on March 19th, 2017, marriage was never consummated and she left on June 25 th, 2017     Current Outpatient Prescriptions:  .  albuterol (PROVENTIL HFA;VENTOLIN HFA) 108 (90 BASE) MCG/ACT inhaler, Inhale 2 puffs into the lungs every 6 (six) hours as needed for wheezing or shortness of breath., Disp: 1 Inhaler, Rfl: 1 .  Amino Acids (L-CARNITINE) LIQD, Take 1,100 mg by mouth daily., Disp: , Rfl:  .  Azelastine-Fluticasone (DYMISTA) 137-50 MCG/ACT SUSP, Place 1 spray into the nose 2 (two) times daily., Disp: 23 g, Rfl: 2 .  busPIRone (BUSPAR) 5 MG tablet, Take 1 tablet (5 mg total) by mouth 2 (two) times daily., Disp: 60 tablet, Rfl: 0 .  Creatine POWD, 1 scoop by Does not apply route daily., Disp: , Rfl:  .  fluticasone (FLONASE) 50 MCG/ACT nasal spray, Place 2 sprays into both nostrils daily. Reported on 08/28/2015, Disp: 16 g, Rfl: 2 .  Glutamine 5 g PACK, Take 5 g by mouth daily., Disp: , Rfl:  .  hyoscyamine (LEVSIN) 0.125 MG tablet, Take 1 tablet (0.125 mg total) by mouth every 4 (four) hours as needed for cramping., Disp: 30 tablet, Rfl: 2 .  levocetirizine (XYZAL) 5 MG tablet, Take 1 tablet (5 mg total) by mouth every evening., Disp: 30  tablet, Rfl: 0 .  montelukast (SINGULAIR) 10 MG tablet, Take 1 tablet (10 mg total) by mouth at bedtime., Disp: 30 tablet, Rfl: 3 .  PARoxetine (PAXIL) 20 MG tablet, Take 1 tablet (20 mg total) by mouth daily., Disp: 90 tablet, Rfl: 1 .  ranitidine (ZANTAC) 300 MG tablet, Take 1 tablet (300 mg total) by mouth at bedtime., Disp: 30 tablet, Rfl: 2 .  Whey Protein Concentrate POWD, Take 1 each by mouth 2 (two) times daily., Disp: , Rfl:   No Known Allergies   ROS  Constitutional: Negative for fever, positive for  weight change ( but he has his gun and work gear on).  Respiratory: Negative for cough and shortness of breath.   Cardiovascular: Negative for chest pain or palpitations.  Gastrointestinal: Negative for abdominal pain, no bowel changes.  Musculoskeletal: Negative for gait problem or joint swelling.  Skin: Negative for rash.  Neurological: Negative for dizziness or headache.  No other specific complaints in a complete review of systems (except as listed in HPI above).  Objective  Vitals:   11/11/16 1428  BP: 118/78  Pulse: 70  Resp: 16  Temp: 98.9 F (37.2 C)  SpO2: 98%  Weight: 180 lb 1 oz (81.7 kg)  Height: 5\' 5"  (1.651 m)    Body mass index is 29.96 kg/m.  Physical Exam  Constitutional: Patient appears well-developed and well-nourished.  No distress.  HEENT: head atraumatic, normocephalic, pupils equal and reactive to light, boggy turbinates, neck supple, throat within normal limits Cardiovascular: Normal rate, regular rhythm and normal heart sounds.  No murmur heard. No BLE edema. Pulmonary/Chest: Effort normal and breath sounds normal. No respiratory distress. Abdominal: Soft.  There is no tenderness. Psychiatric: Patient has a normal mood and affect. behavior is normal. Judgment and thought content normal. Muscular Skeletal: pain with abduction of left shoulder  PHQ2/9: Depression screen [facility] 2/9 08/14/2016 02/18/2016 01/02/2016 10/01/2015 08/28/2015  Decreased  Interest 1 2 0 0 1  Down, Depressed, Hopeless 1 2 0 0 2  PHQ - 2 Score 2 4 0 0 3  Altered sleeping 1 3 - - 3  Tired, decreased energy 0 1 - - 3  Change in appetite 0 0 - - 0  Feeling bad or failure about yourself  0 0 - - 1  Trouble concentrating 0 1 - - 3  Moving slowly or fidgety/restless 0 0 - - 0  Suicidal thoughts 0 0 - - 0  PHQ-9 Score 3 9 - - 13  Difficult doing work/chores Somewhat difficult Somewhat difficult - - -     Fall Risk: Fall Risk  01/02/2016 10/01/2015 08/28/2015 04/03/2015 12/31/2014  Falls in the past year? No No No No No      Assessment & Plan  1.  Asthma, cough variant  - montelukast (SINGULAIR) 10 MG tablet; Take 1 tablet (10 mg total) by mouth at bedtime.  Dispense: 30 tablet; Refill: 3  2. Perennial allergic rhinitis with seasonal variation  - levocetirizine (XYZAL) 5 MG tablet; Take 1 tablet (5 mg total) by mouth every evening.  Dispense: 30 tablet; Refill: 0 - Azelastine-Fluticasone (DYMISTA) 137-50 MCG/ACT SUSP; Place 1 spray into the nose 2 (two) times daily.  Dispense: 23 g; Refill: 2  3. Other insomnia  We will add Buspar for anxiety since insomnia seems to be secondary to anxiety - he did not respond to Trazodone  4. Generalized anxiety disorder  - busPIRone (BUSPAR) 5 MG tablet; Take 1 tablet (5 mg total) by mouth 2 (two) times daily.  Dispense: 60 tablet; Refill: 0   5. Depression, major, recurrent, mild ([facility])  Continue Prozac   6. Shoulder impingement syndrome, left  He mentioned at the end of the visit, advised otc nsaid's and shoulder exercise

## 2016-11-11 NOTE — Patient Instructions (Signed)
Shoulder Exercises Ask your health care provider which exercises are safe for you. Do exercises exactly as told by your health care provider and adjust them as directed. It is normal to feel mild stretching, pulling, tightness, or discomfort as you do these exercises, but you should stop right away if you feel sudden pain or your pain gets worse.Do not begin these exercises until told by your health care provider. RANGE OF MOTION EXERCISES  These exercises warm up your muscles and joints and improve the movement and flexibility of your shoulder. These exercises also help to relieve pain, numbness, and tingling. These exercises involve stretching your injured shoulder directly. Exercise A: Pendulum   1. Stand near a wall or a surface that you can hold onto for balance. 2. Bend at the waist and let your left / right arm hang straight down. Use your other arm to support you. Keep your back straight and do not lock your knees. 3. Relax your left / right arm and shoulder muscles, and move your hips and your trunk so your left / right arm swings freely. Your arm should swing because of the motion of your body, not because you are using your arm or shoulder muscles. 4. Keep moving your body so your arm swings in the following directions, as told by your health care provider:  Side to side.  Forward and backward.  In clockwise and counterclockwise circles. 5. Continue each motion for __________ seconds, or for as long as told by your health care provider. 6. Slowly return to the starting position. Repeat __________ times. Complete this exercise __________ times a day. Exercise B:Flexion, Standing   1. Stand and hold a broomstick, a cane, or a similar object. Place your hands a little more than shoulder-width apart on the object. Your left / right hand should be palm-up, and your other hand should be palm-down. 2. Keep your elbow straight and keep your shoulder muscles relaxed. Push the stick down  with your healthy arm to raise your left / right arm in front of your body, and then over your head until you feel a stretch in your shoulder.  Avoid shrugging your shoulder while you raise your arm. Keep your shoulder blade tucked down toward the middle of your back. 3. Hold for __________ seconds. 4. Slowly return to the starting position. Repeat __________ times. Complete this exercise __________ times a day. Exercise C: Abduction, Standing  1. Stand and hold a broomstick, a cane, or a similar object. Place your hands a little more than shoulder-width apart on the object. Your left / right hand should be palm-up, and your other hand should be palm-down. 2. While keeping your elbow straight and your shoulder muscles relaxed, push the stick across your body toward your left / right side. Raise your left / right arm to the side of your body and then over your head until you feel a stretch in your shoulder.  Do not raise your arm above shoulder height, unless your health care provider tells you to do that.  Avoid shrugging your shoulder while you raise your arm. Keep your shoulder blade tucked down toward the middle of your back. 3. Hold for __________ seconds. 4. Slowly return to the starting position. Repeat __________ times. Complete this exercise __________ times a day. Exercise D:Internal Rotation   1. Place your left / right hand behind your back, palm-up. 2. Use your other hand to dangle an exercise band, a towel, or a similar object over your   shoulder. Grasp the band with your left / right hand so you are holding onto both ends. 3. Gently pull up on the band until you feel a stretch in the front of your left / right shoulder.  Avoid shrugging your shoulder while you raise your arm. Keep your shoulder blade tucked down toward the middle of your back. 4. Hold for __________ seconds. 5. Release the stretch by letting go of the band and lowering your hands. Repeat __________ times.  Complete this exercise __________ times a day. STRETCHING EXERCISES  These exercises warm up your muscles and joints and improve the movement and flexibility of your shoulder. These exercises also help to relieve pain, numbness, and tingling. These exercises are done using your healthy shoulder to help stretch the muscles of your injured shoulder. Exercise E: Corner Stretch (External Rotation and Abduction)   1. Stand in a doorway with one of your feet slightly in front of the other. This is called a staggered stance. If you cannot reach your forearms to the door frame, stand facing a corner of a room. 2. Choose one of the following positions as told by your health care provider:  Place your hands and forearms on the door frame above your head.  Place your hands and forearms on the door frame at the height of your head.  Place your hands on the door frame at the height of your elbows. 3. Slowly move your weight onto your front foot until you feel a stretch across your chest and in the front of your shoulders. Keep your head and chest upright and keep your abdominal muscles tight. 4. Hold for __________ seconds. 5. To release the stretch, shift your weight to your back foot. Repeat __________ times. Complete this stretch __________ times a day. Exercise F:Extension, Standing  1. Stand and hold a broomstick, a cane, or a similar object behind your back.  Your hands should be a little wider than shoulder-width apart.  Your palms should face away from your back. 2. Keeping your elbows straight and keeping your shoulder muscles relaxed, move the stick away from your body until you feel a stretch in your shoulder.  Avoid shrugging your shoulders while you move the stick. Keep your shoulder blade tucked down toward the middle of your back. 3. Hold for __________ seconds. 4. Slowly return to the starting position. Repeat __________ times. Complete this exercise __________ times a  day. STRENGTHENING EXERCISES  These exercises build strength and endurance in your shoulder. Endurance is the ability to use your muscles for a long time, even after they get tired. Exercise G:External Rotation   1. Sit in a stable chair without armrests. 2. Secure an exercise band at elbow height on your left / right side. 3. Place a soft object, such as a folded towel or a small pillow, between your left / right upper arm and your body to move your elbow a few inches away (about 10 cm) from your side. 4. Hold the end of the band so it is tight and there is no slack. 5. Keeping your elbow pressed against the soft object, move your left / right forearm out, away from your abdomen. Keep your body steady so only your forearm moves. 6. Hold for __________ seconds. 7. Slowly return to the starting position. Repeat __________ times. Complete this exercise __________ times a day. Exercise H:Shoulder Abduction   1. Sit in a stable chair without armrests, or stand. 2. Hold a __________ weight in your left /   right hand, or hold an exercise band with both hands. 3. Start with your arms straight down and your left / right palm facing in, toward your body. 4. Slowly lift your left / right hand out to your side. Do not lift your hand above shoulder height unless your health care provider tells you that this is safe.  Keep your arms straight.  Avoid shrugging your shoulder while you do this movement. Keep your shoulder blade tucked down toward the middle of your back. 5. Hold for __________ seconds. 6. Slowly lower your arm, and return to the starting position. Repeat __________ times. Complete this exercise __________ times a day. Exercise I:Shoulder Extension  1. Sit in a stable chair without armrests, or stand. 2. Secure an exercise band to a stable object in front of you where it is at shoulder height. 3. Hold one end of the exercise band in each hand. Your palms should face each  other. 4. Straighten your elbows and lift your hands up to shoulder height. 5. Step back, away from the secured end of the exercise band, until the band is tight and there is no slack. 6. Squeeze your shoulder blades together as you pull your hands down to the sides of your thighs. Stop when your hands are straight down by your sides. Do not let your hands go behind your body. 7. Hold for __________ seconds. 8. Slowly return to the starting position. Repeat __________ times. Complete this exercise __________ times a day. Exercise J:Standing Shoulder Row  1. Sit in a stable chair without armrests, or stand. 2. Secure an exercise band to a stable object in front of you so it is at waist height. 3. Hold one end of the exercise band in each hand. Your palms should be in a thumbs-up position. 4. Bend each of your elbows to an "L" shape (about 90 degrees) and keep your upper arms at your sides. 5. Step back until the band is tight and there is no slack. 6. Slowly pull your elbows back behind you. 7. Hold for __________ seconds. 8. Slowly return to the starting position. Repeat __________ times. Complete this exercise __________ times a day. Exercise K:Shoulder Press-Ups   1. Sit in a stable chair that has armrests. Sit upright, with your feet flat on the floor. 2. Put your hands on the armrests so your elbows are bent and your fingers are pointing forward. Your hands should be about even with the sides of your body. 3. Push down on the armrests and use your arms to lift yourself off of the chair. Straighten your elbows and lift yourself up as much as you comfortably can.  Move your shoulder blades down, and avoid letting your shoulders move up toward your ears.  Keep your feet on the ground. As you get stronger, your feet should support less of your body weight as you lift yourself up. 4. Hold for __________ seconds. 5. Slowly lower yourself back into the chair. Repeat __________ times.  Complete this exercise __________ times a day. Exercise L: Wall Push-Ups   1. Stand so you are facing a stable wall. Your feet should be about one arm-length away from the wall. 2. Lean forward and place your palms on the wall at shoulder height. 3. Keep your feet flat on the floor as you bend your elbows and lean forward toward the wall. 4. Hold for __________ seconds. 5. Straighten your elbows to push yourself back to the starting position. Repeat __________ times. Complete   this exercise __________ times a day. This information is not intended to replace advice given to you by your health care provider. Make sure you discuss any questions you have with your health care provider. Document Released: 05/20/2005 Document Revised: 03/30/2016 Document Reviewed: 03/17/2015 Elsevier Interactive Patient Education  2017 [employer]  

## 2016-12-13 ENCOUNTER — Other Ambulatory Visit: Payer: Self-pay | Admitting: Family Medicine

## 2016-12-13 DIAGNOSIS — F411 Generalized anxiety disorder: Secondary | ICD-10-CM

## 2017-01-04 ENCOUNTER — Ambulatory Visit (INDEPENDENT_AMBULATORY_CARE_PROVIDER_SITE_OTHER): Payer: Managed Care, Other (non HMO) | Admitting: Family Medicine

## 2017-01-04 ENCOUNTER — Encounter: Payer: Self-pay | Admitting: Family Medicine

## 2017-01-04 VITALS — BP 122/78 | HR 60 | Temp 98.4°F | Resp 16 | Ht 66.5 in | Wt 173.9 lb

## 2017-01-04 DIAGNOSIS — R739 Hyperglycemia, unspecified: Secondary | ICD-10-CM | POA: Diagnosis not present

## 2017-01-04 DIAGNOSIS — E785 Hyperlipidemia, unspecified: Secondary | ICD-10-CM | POA: Diagnosis not present

## 2017-01-04 DIAGNOSIS — E663 Overweight: Secondary | ICD-10-CM

## 2017-01-04 DIAGNOSIS — Z0000 Encounter for general adult medical examination without abnormal findings: Secondary | ICD-10-CM

## 2017-01-04 NOTE — Progress Notes (Signed)
Name: Anthony Hale   MRN: [id number]    DOB: 07-30-1985   Date:01/04/2017       Progress Note  Subjective  Chief Complaint  Chief Complaint  Patient presents with  . 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 He is taking online courses to try to get another job. 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 He is also tired of seeing " dead people". He stopped taking allergy medication and anti-depressants. He states he is doing well.   Dyslipidemia: not on medication, had screen at work, not fasting and HDL was very low, we will recheck labs fasting  Patient Active Problem List   Diagnosis Date Noted  . Depression, major, recurrent, mild ([place]) 08/28/2015  . Generalized anxiety disorder 08/28/2015  . IBS (irritable bowel syndrome) 04/03/2015  . Fatty tumor 12/31/2014  . Decorative tattoo 12/31/2014  . Asthma, cough variant 12/30/2014  . Insomnia 12/30/2014  . GERD (gastroesophageal reflux disease) 12/30/2014  . Perennial allergic rhinitis with seasonal variation 05/23/2007    Past Surgical History:  Procedure Laterality Date  . WISDOM TOOTH EXTRACTION      Family History  Problem Relation Age of Onset  . Cancer Mother        Breast  . Hyperlipidemia Mother   . Heart disease Father   . Hyperlipidemia Father   . Hypertension Father     Social History   Social History  . Marital status: Legally Separated    Spouse name: N/A  . Number of children: N/A  . Years of education: N/A   Occupational History  . police officer    Social History Main Topics  . Smoking status: Never Smoker  . Smokeless tobacco: Former Systems developer    Types: Chew  . Alcohol use 0.0 oz/week     Comment: rarely  . Drug use: No  . Sexual activity: Not Currently    Partners: Female   Other Topics Concern  . Not on file   Social History  Narrative   Married on March 19th, 2017, marriage was never consummated and she left on June 25 th, 2017     Current Outpatient Prescriptions:  .  Creatine POWD, 1 scoop by Does not apply route daily., Disp: , Rfl:  .  ranitidine (ZANTAC) 300 MG tablet, Take 1 tablet (300 mg total) by mouth at bedtime., Disp: 30 tablet, Rfl: 2 .  Whey Protein Concentrate POWD, Take 1 each by mouth 2 (two) times daily., Disp: , Rfl:  .  Amino Acids (L-CARNITINE) LIQD, Take 1,100 mg by mouth daily., Disp: , Rfl:  .  fluticasone (FLONASE) 50 MCG/ACT nasal spray, Place 2 sprays into both nostrils daily. Reported on 08/28/2015 (Patient not taking: Reported on 01/04/2017), Disp: 16 g, Rfl: 2 .  Glutamine 5 g PACK, Take 5 g by mouth daily., Disp: , Rfl:  .  levocetirizine (XYZAL) 5 MG tablet, Take 1 tablet (5 mg total) by mouth every evening. (Patient not taking: Reported on 01/04/2017), Disp: 30 tablet, Rfl: 0  No Known Allergies   ROS  Constitutional: Negative for fever, positive for  weight change.  Respiratory: Negative for cough and shortness of breath.   Cardiovascular: Negative for chest pain or palpitations.  Gastrointestinal: Negative for abdominal pain, no bowel changes.  Musculoskeletal: Negative for gait problem or joint swelling.  Skin: Negative for rash.  Neurological: Negative for dizziness or headache.  No other specific complaints in a complete review of systems (except as listed in HPI above).  Objective  Vitals:   01/04/17 1125  BP: 122/78  Pulse: 60  Resp: 16  Temp: 98.4 F (36.9 C)  TempSrc: Oral  SpO2: 95%  Weight: 173 lb 14.4 oz (78.9 kg)  Height: 5' 6.5" (1.689 m)    Body mass index is 27.65 kg/m.  Physical Exam  Constitutional: Patient appears well-developed and well-nourished. No distress.  HENT: Head: Normocephalic and atraumatic. Ears: B TMs ok, no erythema or effusion; Nose: Nose normal. Mouth/Throat: Oropharynx is clear and moist. No oropharyngeal exudate.  Eyes:  Conjunctivae and EOM are normal. Pupils are equal, round, and reactive to light. No scleral icterus.  Neck: Normal range of motion. Neck supple. No JVD present. No thyromegaly present.  Cardiovascular: Normal rate, regular rhythm and normal heart sounds.  No murmur heard. No BLE edema. Pulmonary/Chest: Effort normal and breath sounds normal. No respiratory distress. Abdominal: Soft. Bowel sounds are normal, no distension. There is no tenderness. no masses MALE GENITALIA: Normal descended testes bilaterally, no masses palpated, no hernias, no lesions, no discharge RECTAL: not done Musculoskeletal: Normal range of motion, no joint effusions. No gross deformities Neurological: he is alert and oriented to person, place, and time. No cranial nerve deficit. Coordination, balance, strength, speech and gait are normal.  Skin: Skin is warm and dry. No rash noted. No erythema.  Psychiatric: Patient has a normal mood and affect. behavior is normal. Judgment and thought content normal.  PHQ2/9: Depression screen [facility] 2/9 01/04/2017 08/14/2016 02/18/2016 01/02/2016 10/01/2015  Decreased Interest 0 1 2 0 0  Down, Depressed, Hopeless 0 1 2 0 0  PHQ - 2 Score 0 2 4 0 0  Altered sleeping - 1 3 - -  Tired, decreased energy - 0 1 - -  Change in appetite - 0 0 - -  Feeling bad or failure about yourself  - 0 0 - -  Trouble concentrating - 0 1 - -  Moving slowly or fidgety/restless - 0 0 - -  Suicidal thoughts - 0 0 - -  PHQ-9 Score - 3 9 - -  Difficult doing work/chores - Somewhat difficult Somewhat difficult - -     Fall Risk: Fall Risk  01/04/2017 01/02/2016 10/01/2015 08/28/2015 04/03/2015  Falls in the past year? No No No No No     Functional Status Survey: Is the patient deaf or have difficulty hearing?: No Does the patient have difficulty seeing, even when wearing glasses/contacts?: No Does the patient have difficulty concentrating, remembering, or making decisions?: No Does the patient have difficulty  walking or climbing stairs?: No Does the patient have difficulty dressing or bathing?: No Does the patient have difficulty doing errands alone such as visiting a doctor's office or shopping?: No    Assessment & Plan  1. Well adult exam  Discussed importance of 150 minutes of physical activity weekly, eat two servings of fish weekly, eat one serving of tree nuts ( cashews, pistachios, pecans, almonds.Marland Kitchen) every other day, eat 6 servings of fruit/vegetables daily and drink plenty of water and avoid sweet beverages.  - COMPLETE METABOLIC PANEL WITH GFR - CBC with Differential/Platelet - Insulin, fasting - Hemoglobin A1c - Lipid panel  2. Hyperglycemia  - Insulin, fasting - Hemoglobin A1c  3. Dyslipidemia  - Lipid panel Lipid panel shows  low HDL : to improve HDL patient  needs to eat tree nuts ( pecans/pistachios/almonds ) four times weekly, eat fish two times weekly  and exercise  at least 150 minutes per week  4. Overweight (BMI 25.0-29.9)  Lost some weight since last visit, doing well

## 2017-01-04 NOTE — Patient Instructions (Signed)
Preventive Care 18-39 Years, Male Preventive care refers to lifestyle choices and visits with your health care provider that can promote health and wellness. What does preventive care include?  A yearly physical exam. This is also called an annual well check.  Dental exams once or twice a year.  Routine eye exams. Ask your health care provider how often you should have your eyes checked.  Personal lifestyle choices, including: ? Daily care of your teeth and gums. ? Regular physical activity. ? Eating a healthy diet. ? Avoiding tobacco and drug use. ? Limiting alcohol use. ? Practicing safe sex. What happens during an annual well check? The services and screenings done by your health care provider during your annual well check will depend on your age, overall health, lifestyle risk factors, and family history of disease. Counseling Your health care provider may ask you questions about your:  Alcohol use.  Tobacco use.  Drug use.  Emotional well-being.  Home and relationship well-being.  Sexual activity.  Eating habits.  Work and work environment.  Screening You may have the following tests or measurements:  Height, weight, and BMI.  Blood pressure.  Lipid and cholesterol levels. These may be checked every 5 years starting at age 20.  Diabetes screening. This is done by checking your blood sugar (glucose) after you have not eaten for a while (fasting).  Skin check.  Hepatitis C blood test.  Hepatitis B blood test.  Sexually transmitted disease (STD) testing.  Discuss your test results, treatment options, and if necessary, the need for more tests with your health care provider. Vaccines Your health care provider may recommend certain vaccines, such as:  Influenza vaccine. This is recommended every year.  Tetanus, diphtheria, and acellular pertussis (Tdap, Td) vaccine. You may need a Td booster every 10 years.  Varicella vaccine. You may need this if you  have not been vaccinated.  HPV vaccine. If you are 26 or younger, you may need three doses over 6 months.  Measles, mumps, and rubella (MMR) vaccine. You may need at least one dose of MMR.You may also need a second dose.  Pneumococcal 13-valent conjugate (PCV13) vaccine. You may need this if you have certain conditions and have not been vaccinated.  Pneumococcal polysaccharide (PPSV23) vaccine. You may need one or two doses if you smoke cigarettes or if you have certain conditions.  Meningococcal vaccine. One dose is recommended if you are age 19-21 years and a first-year college student living in a residence hall, or if you have one of several medical conditions. You may also need additional booster doses.  Hepatitis A vaccine. You may need this if you have certain conditions or if you travel or work in places where you may be exposed to hepatitis A.  Hepatitis B vaccine. You may need this if you have certain conditions or if you travel or work in places where you may be exposed to hepatitis B.  Haemophilus influenzae type b (Hib) vaccine. You may need this if you have certain risk factors.  Talk to your health care provider about which screenings and vaccines you need and how often you need them. This information is not intended to replace advice given to you by your health care provider. Make sure you discuss any questions you have with your health care provider. Document Released: 09/01/2001 Document Revised: 03/25/2016 Document Reviewed: 05/07/2015 Elsevier Interactive Patient Education  2017 [employer].  

## 2017-01-13 ENCOUNTER — Encounter: Payer: Self-pay | Admitting: Family Medicine

## 2017-01-13 ENCOUNTER — Ambulatory Visit (INDEPENDENT_AMBULATORY_CARE_PROVIDER_SITE_OTHER): Payer: Managed Care, Other (non HMO) | Admitting: Family Medicine

## 2017-01-13 VITALS — BP 118/72 | HR 71 | Temp 98.9°F | Resp 18 | Ht 67.0 in | Wt 172.7 lb

## 2017-01-13 DIAGNOSIS — J029 Acute pharyngitis, unspecified: Secondary | ICD-10-CM | POA: Diagnosis not present

## 2017-01-13 DIAGNOSIS — J039 Acute tonsillitis, unspecified: Secondary | ICD-10-CM | POA: Diagnosis not present

## 2017-01-13 LAB — CBC WITH DIFFERENTIAL/PLATELET
Basophils Absolute: 0 cells/uL (ref 0–200)
Basophils Relative: 0 %
EOS ABS: 194 {cells}/uL (ref 15–500)
EOS PCT: 2 %
HCT: 45.3 % (ref 38.5–50.0)
HEMOGLOBIN: 15.4 g/dL (ref 13.2–17.1)
LYMPHS ABS: 970 {cells}/uL (ref 850–3900)
Lymphocytes Relative: 10 %
MCH: 29.8 pg (ref 27.0–33.0)
MCHC: 34 g/dL (ref 32.0–36.0)
MCV: 87.8 fL (ref 80.0–100.0)
MPV: 9.7 fL (ref 7.5–12.5)
Monocytes Absolute: 970 cells/uL — ABNORMAL HIGH (ref 200–950)
Monocytes Relative: 10 %
NEUTROS ABS: 7566 {cells}/uL (ref 1500–7800)
Neutrophils Relative %: 78 %
Platelets: 220 10*3/uL (ref 140–400)
RBC: 5.16 MIL/uL (ref 4.20–5.80)
RDW: 13.2 % (ref 11.0–15.0)
WBC: 9.7 10*3/uL (ref 3.8–10.8)

## 2017-01-13 LAB — POCT RAPID STREP A (OFFICE): Rapid Strep A Screen: NEGATIVE

## 2017-01-13 MED ORDER — AMOXICILLIN-POT CLAVULANATE 875-125 MG PO TABS: 1.0000 | ORAL_TABLET | Freq: Two times a day (BID) | ORAL | 0 refills | Status: AC

## 2017-01-13 NOTE — Progress Notes (Addendum)
Name: Anthony Hale   MRN: [id number]    DOB: 26-Sep-1985   Date:01/13/2017       Progress Note  Subjective  Chief Complaint  Chief Complaint  Patient presents with  . Sore Throat    cough, congested for 2 days    HPI  Pt presents with c/o 4-5 day history of nasal drainage/congestion and sore throat. Last night had trouble sleeping because of the drainage and throat pain. He notes his voice is more hoarse today, endorses nausea, fatigue, some right ear pain/pressure, minimal cough. No fevers/chills, chest pain, shortness of breath, vomiting, diarrhea, abdominal pain, body aches.  Patient Active Problem List   Diagnosis Date Noted  . Depression, major, recurrent, mild ([place]) 08/28/2015  . Generalized anxiety disorder 08/28/2015  . IBS (irritable bowel syndrome) 04/03/2015  . Fatty tumor 12/31/2014  . Decorative tattoo 12/31/2014  . Asthma, cough variant 12/30/2014  . Insomnia 12/30/2014  . GERD (gastroesophageal reflux disease) 12/30/2014  . Perennial allergic rhinitis with seasonal variation 05/23/2007    Social History  Substance Use Topics  . Smoking status: Never Smoker  . Smokeless tobacco: Former Systems developer    Types: Chew  . Alcohol use 0.0 oz/week     Comment: rarely     Current Outpatient Prescriptions:  .  Amino Acids (L-CARNITINE) LIQD, Take 1,100 mg by mouth daily., Disp: , Rfl:  .  Creatine POWD, 1 scoop by Does not apply route daily., Disp: , Rfl:  .  fluticasone (FLONASE) 50 MCG/ACT nasal spray, Place 2 sprays into both nostrils daily. Reported on 08/28/2015, Disp: 16 g, Rfl: 2 .  Glutamine 5 g PACK, Take 5 g by mouth daily., Disp: , Rfl:  .  levocetirizine (XYZAL) 5 MG tablet, Take 1 tablet (5 mg total) by mouth every evening., Disp: 30 tablet, Rfl: 0 .  ranitidine (ZANTAC) 300 MG tablet, Take 1 tablet (300 mg total) by mouth at bedtime., Disp: 30 tablet, Rfl: 2 .  Whey Protein Concentrate POWD, Take 1 each by mouth 2 (two) times daily., Disp: , Rfl:  .   amoxicillin-clavulanate (AUGMENTIN) 875-125 MG tablet, Take 1 tablet by mouth 2 (two) times daily., Disp: 20 tablet, Rfl: 0  No Known Allergies  ROS  Ten systems reviewed and is negative except as mentioned in HPI   Objective  Vitals:   01/13/17 0913  BP: 118/72  Pulse: 71  Resp: 18  Temp: 98.9 F (37.2 C)  TempSrc: Oral  SpO2: 96%  Weight: 172 lb 11.2 oz (78.3 kg)  Height: 5\' 7"  (1.702 m)    Body mass index is 27.05 kg/m.  Nursing Note and Vital Signs reviewed.  Physical Exam  Constitutional: Patient appears well-developed and well-nourished. No distress.  HEENT: head atraumatic, normocephalic, pupils equal and reactive to light, EOM's intact, TM's without erythema or bulging, no maxillary or frontal sinus pain on palpation, neck supple with submandibular bilateral lymphadenopathy, oropharynx erythematous, tonsils are +2 and moist with moderate amount of white exudate present. Cardiovascular: Normal rate, regular rhythm, S1/S2 present.  No murmur or rub heard. No BLE edema. Pulmonary/Chest: Effort normal and breath sounds clear. No respiratory distress or retractions. Psychiatric: Patient has a normal mood and affect. behavior is normal. Judgment and thought content normal.  Recent Results (from the past 2160 hour(s))  POCT rapid strep A     Status: None   Collection Time: 01/13/17  9:18 AM  Result Value Ref Range   Rapid Strep A Screen Negative Negative  Assessment & Plan  1. Tonsillitis - amoxicillin-clavulanate (AUGMENTIN) 875-125 MG tablet; Take 1 tablet by mouth 2 (two) times daily.  Dispense: 20 tablet; Refill: 0  2. Sore throat - POCT rapid strep A - amoxicillin-clavulanate (AUGMENTIN) 875-125 MG tablet; Take 1 tablet by mouth 2 (two) times daily.  Dispense: 20 tablet; Refill: 0  - Declines Magic Mouthwash or Viscous Lidocaine. Will take Tylenol or Ibuprofen PRN for pain. - Declines prednisone for now - will call back if he changes his mind. - Work  note provided. -Red flags and when to present for emergency care or RTC including fever >101.21F, chest pain, shortness of breath, new/worsening/un-resolving symptoms, difficulty breathing, signs and symptoms of peritonsillar abscess reviewed with patient at time of visit. Follow up and care instructions discussed and provided in AVS.  I have reviewed this encounter including the documentation in this note and/or discussed this patient with the Anthony Maine, FNP, NP-C. I am certifying that I agree with the content of this note as supervising physician.  Anthony Sizer, MD [facility] 01/17/2017, 6:15 PM

## 2017-01-13 NOTE — Patient Instructions (Addendum)
Please take 400-600mg  Ibuprofen of 625mg  Tyelnol as needed for pain. Continue using your humidifier. Tonsillitis Tonsillitis is an infection of the throat. This infection causes the tonsils to become red, tender, and swollen. Tonsils are tissues in the back of your throat. If bacteria caused your infection, antibiotic medicine will be given to you. Sometimes, symptoms of this infection can be helped with the use of steroid medicine. If your tonsillitis is very bad (severe) and happens often, you may need to get your tonsils removed (tonsillectomy). Follow these instructions at home: Medicines  Take over-the-counter and prescription medicines only as told by your doctor.  If you were prescribed an antibiotic, take it as told by your doctor. Do not stop taking the antibiotic even if you start to feel better. Eating and drinking  Drink enough fluid to keep your pee (urine) clear or pale yellow.  While your throat is sore, eat soft or liquid foods like: ? Soup. ? Sherbert. ? Instant breakfast drinks.  Drink warm fluids.  Eat frozen ice pops. General instructions  Rest as much as possible and get plenty of sleep.  Gargle with a salt-water mixture 3-4 times a day or as needed. To make a salt-water mixture, completely dissolve -1 tsp of salt in 1 cup of warm water.  Wash your hands often with soap and water. If there is no soap and water, use hand sanitizer.  Do not share cups, bottles, or other utensils until your symptoms are gone.  Do not smoke. If you need help quitting, ask your doctor.  Keep all follow-up visits as told by your doctor. This is important. Contact a doctor if:  You have large, tender lumps in your neck.  You have a fever that does not go away after 2-3 days.  You have a rash.  You cough up green, yellow-brown, or bloody fluid.  You cannot swallow liquids or food for 24 hours.  Only one of your tonsils is swollen. Get help right away if:  You have any  new symptoms such as: ? Vomiting ? Very bad headache ? Stiff neck ? Chest pain ? Trouble breathing or swallowing  You have very bad throat pain and also have drooling or voice changes.  You have very bad pain that is not helped by medicine.  You cannot fully open your mouth.  You have redness, swelling, or severe pain anywhere in your neck. Summary  Tonsillitis causes your tonsils to be red, tender, and swollen.  While your throat is sore eat soft or liquid foods.  Gargle with a salt-water mixture 3-4 times a day or as needed.  Do not share cups, bottles, or other utensils until your symptoms are gone. This information is not intended to replace advice given to you by your health care provider. Make sure you discuss any questions you have with your health care provider. Document Released: 12/23/2007 Document Revised: 12/12/2015 Document Reviewed: 12/23/2012 Elsevier Interactive Patient Education  2017 [employer].

## 2017-01-14 ENCOUNTER — Encounter: Payer: Managed Care, Other (non HMO) | Admitting: Family Medicine

## 2017-01-14 LAB — LIPID PANEL
CHOLESTEROL: 144 mg/dL (ref <200)
HDL: 34 mg/dL — ABNORMAL LOW (ref >40)
LDL Cholesterol: 87 mg/dL (ref <100)
TRIGLYCERIDES: 115 mg/dL (ref <150)
Total CHOL/HDL Ratio: 4.2 Ratio (ref <5.0)
VLDL: 23 mg/dL (ref <30)

## 2017-01-14 LAB — COMPLETE METABOLIC PANEL WITH GFR
ALT: 19 U/L (ref 9–46)
AST: 12 U/L (ref 10–40)
Albumin: 4.3 g/dL (ref 3.6–5.1)
Alkaline Phosphatase: 91 U/L (ref 40–115)
BUN: 9 mg/dL (ref 7–25)
CHLORIDE: 103 mmol/L (ref 98–110)
CO2: 30 mmol/L (ref 20–31)
Calcium: 9.2 mg/dL (ref 8.6–10.3)
Creat: 0.99 mg/dL (ref 0.60–1.35)
GFR, Est African American: >89 mL/min (ref >=60)
Glucose, Bld: 99 mg/dL (ref 65–99)
POTASSIUM: 4.4 mmol/L (ref 3.5–5.3)
SODIUM: 140 mmol/L (ref 135–146)
Total Bilirubin: 0.5 mg/dL (ref 0.2–1.2)
Total Protein: 6.8 g/dL (ref 6.1–8.1)

## 2017-01-14 LAB — HEMOGLOBIN A1C
Hgb A1c MFr Bld: 5.1 % (ref <5.7)
Mean Plasma Glucose: 100 mg/dL

## 2017-01-14 LAB — INSULIN, FASTING: INSULIN FASTING, SERUM: 8.8 u[IU]/mL (ref 2.0–19.6)

## 2017-02-08 ENCOUNTER — Encounter: Payer: Managed Care, Other (non HMO) | Admitting: Family Medicine

## 2017-02-11 ENCOUNTER — Encounter: Payer: Self-pay | Admitting: Family Medicine

## 2017-02-11 ENCOUNTER — Ambulatory Visit (INDEPENDENT_AMBULATORY_CARE_PROVIDER_SITE_OTHER): Payer: Managed Care, Other (non HMO) | Admitting: Family Medicine

## 2017-02-11 VITALS — BP 122/78 | HR 67 | Temp 98.0°F | Resp 18 | Ht 67.0 in | Wt 166.1 lb

## 2017-02-11 DIAGNOSIS — K219 Gastro-esophageal reflux disease without esophagitis: Secondary | ICD-10-CM

## 2017-02-11 DIAGNOSIS — F429 Obsessive-compulsive disorder, unspecified: Secondary | ICD-10-CM | POA: Diagnosis not present

## 2017-02-11 DIAGNOSIS — F330 Major depressive disorder, recurrent, mild: Secondary | ICD-10-CM

## 2017-02-11 DIAGNOSIS — J45991 Cough variant asthma: Secondary | ICD-10-CM | POA: Diagnosis not present

## 2017-02-11 DIAGNOSIS — J302 Other seasonal allergic rhinitis: Secondary | ICD-10-CM

## 2017-02-11 DIAGNOSIS — J3089 Other allergic rhinitis: Secondary | ICD-10-CM

## 2017-02-11 MED ORDER — MONTELUKAST SODIUM 10 MG PO TABS: 10.0000 mg | ORAL_TABLET | Freq: Every day | ORAL | 5 refills | Status: DC

## 2017-02-11 MED ORDER — FLUTICASONE-SALMETEROL 100-50 MCG/DOSE IN AEPB: 1.0000 | INHALATION_SPRAY | Freq: Two times a day (BID) | RESPIRATORY_TRACT | 0 refills | Status: DC

## 2017-02-11 MED ORDER — LEVOCETIRIZINE DIHYDROCHLORIDE 5 MG PO TABS: 5.0000 mg | ORAL_TABLET | Freq: Every evening | ORAL | 5 refills | Status: DC

## 2017-02-11 MED ORDER — FLUOXETINE HCL 20 MG PO TABS: 20.0000 mg | ORAL_TABLET | Freq: Every day | ORAL | 1 refills | Status: DC

## 2017-02-11 NOTE — Progress Notes (Signed)
Name: Anthony Hale   MRN: [id number]    DOB: Jul 16, 1986   Date:02/11/2017       Progress Note  Subjective  Chief Complaint  Chief Complaint  Patient presents with  . Depression    3 month follow up no issues  . Anxiety  . Insomnia    not sleeping well  . Cough    pt had tonsilitis about a month ago and cough has not gone completely away    HPI  OCD: he states that over the past 18 months he has noticed intrusive thoughts, has to double check doors, making sure the alarm is set before going to bed, when driving needs to drive back to make sure he did not hit something on the road. He needs to count the motion - usually has to check 4 times. He needs to organized everything in alphabetical order, books on a shelf has to be from large size to smaller size. He organizes his clothes by color. He gets irritated when things are not organized. Starting to affect his life, he states co-workers call him weird and OCD.   Depression/Anxiety: he states he has re-started taking Paxil because he was starting to feel down again, not sleeping well, he states paper work from divorce is almost done. However not helping with OCD symptoms. He has also noticed that he has been spending more money - going out to eat to compensate for his sadness.   Cough variant asthma: he has a long history of cough variant asthma, had an URI/tonsillitis about one month ago, took antibiotics and sore throat resolved, but has a dry cough now. Advised to resume allergy medication   Patient Active Problem List   Diagnosis Date Noted  . Depression, major, recurrent, mild ([place]) 08/28/2015  . Generalized anxiety disorder 08/28/2015  . IBS (irritable bowel syndrome) 04/03/2015  . Fatty tumor 12/31/2014  . Decorative tattoo 12/31/2014  . Asthma, cough variant 12/30/2014  . Insomnia 12/30/2014  . GERD (gastroesophageal reflux disease) 12/30/2014  . Perennial allergic rhinitis with seasonal variation 05/23/2007    Past  Surgical History:  Procedure Laterality Date  . WISDOM TOOTH EXTRACTION      Family History  Problem Relation Age of Onset  . Cancer Mother        Breast  . Hyperlipidemia Mother   . Heart disease Father   . Hyperlipidemia Father   . Hypertension Father     Social History   Social History  . Marital status: Legally Separated    Spouse name: N/A  . Number of children: N/A  . Years of education: N/A   Occupational History  . police officer    Social History Main Topics  . Smoking status: Never Smoker  . Smokeless tobacco: Former Systems developer    Types: Chew  . Alcohol use 0.0 oz/week     Comment: rarely  . Drug use: No  . Sexual activity: Not Currently    Partners: Female   Other Topics Concern  . Not on file   Social History Narrative   Married on March 19th, 2017, marriage was never consummated and she left on June 25 th, 2017     Current Outpatient Prescriptions:  .  Amino Acids (L-CARNITINE) LIQD, Take 1,100 mg by mouth daily., Disp: , Rfl:  .  Creatine POWD, 1 scoop by Does not apply route daily., Disp: , Rfl:  .  FLUoxetine (PROZAC) 20 MG tablet, Take 1 tablet (20 mg total) by mouth daily.,  Disp: 30 tablet, Rfl: 1 .  fluticasone (FLONASE) 50 MCG/ACT nasal spray, Place 2 sprays into both nostrils daily. Reported on 08/28/2015, Disp: 16 g, Rfl: 2 .  Glutamine 5 g PACK, Take 5 g by mouth daily., Disp: , Rfl:  .  levocetirizine (XYZAL) 5 MG tablet, Take 1 tablet (5 mg total) by mouth every evening., Disp: 30 tablet, Rfl: 5 .  montelukast (SINGULAIR) 10 MG tablet, Take 1 tablet (10 mg total) by mouth at bedtime., Disp: 30 tablet, Rfl: 5 .  ranitidine (ZANTAC) 300 MG tablet, Take 1 tablet (300 mg total) by mouth at bedtime., Disp: 30 tablet, Rfl: 2 .  Whey Protein Concentrate POWD, Take 1 each by mouth 2 (two) times daily., Disp: , Rfl:   No Known Allergies   ROS  Constitutional: Negative for fever or weight change.  Respiratory: Negative for cough and shortness of  breath.   Cardiovascular: Negative for chest pain or palpitations.  Gastrointestinal: Negative for abdominal pain, no bowel changes.  Musculoskeletal: Negative for gait problem or joint swelling.  Skin: Negative for rash.  Neurological: Negative for dizziness or headache.  No other specific complaints in a complete review of systems (except as listed in HPI above).  Objective  Vitals:   02/11/17 0838  BP: 122/78  Pulse: 67  Resp: 18  Temp: 98 F (36.7 C)  SpO2: 96%  Weight: 166 lb 2 oz (75.4 kg)  Height: _0  (1.702 m)    Body mass index is 26.02 kg/m.  Physical Exam  Constitutional: Patient appears well-developed and well-nourished. No distress.  HEENT: head atraumatic, normocephalic, pupils equal and reactive to light, ears normal bilaterally, neck supple, throat within normal limits Cardiovascular: Normal rate, regular rhythm and normal heart sounds.  No murmur heard. No BLE edema. Pulmonary/Chest: Effort normal and breath sounds normal. No respiratory distress. Abdominal: Soft.  There is no tenderness. Psychiatric: Patient has a normal mood and affect. behavior is normal. Judgment and thought content normal.  Recent Results (from the past 2160 hour(s))  POCT rapid strep A     Status: None   Collection Time: 01/13/17  9:18 AM  Result Value Ref Range   Rapid Strep A Screen Negative Negative  COMPLETE METABOLIC PANEL WITH GFR     Status: None   Collection Time: 01/13/17  9:27 AM  Result Value Ref Range   Sodium 140 135 - 146 mmol/L   Potassium 4.4 3.5 - 5.3 mmol/L   Chloride 103 98 - 110 mmol/L   CO2 30 20 - 31 mmol/L   Glucose, Bld 99 65 - 99 mg/dL   BUN 9 7 - 25 mg/dL   Creat 0.99 0.60 - 1.35 mg/dL   Total Bilirubin 0.5 0.2 - 1.2 mg/dL   Alkaline Phosphatase 91 40 - 115 U/L   AST 12 10 - 40 U/L   ALT 19 9 - 46 U/L   Total Protein 6.8 6.1 - 8.1 g/dL   Albumin 4.3 3.6 - 5.1 g/dL   Calcium 9.2 8.6 - 10.3 mg/dL   GFR, Est African American >89 >=60 mL/min    GFR, Est Non African American >89 >=60 mL/min  CBC with Differential/Platelet     Status: Abnormal   Collection Time: 01/13/17  9:27 AM  Result Value Ref Range   WBC 9.7 3.8 - 10.8 K/uL   RBC 5.16 4.20 - 5.80 MIL/uL   Hemoglobin 15.4 13.2 - 17.1 g/dL   HCT 45.3 38.5 - 50.0 %   MCV 87.8  80.0 - 100.0 fL   MCH 29.8 27.0 - 33.0 pg   MCHC 34.0 32.0 - 36.0 g/dL   RDW 13.2 11.0 - 15.0 %   Platelets 220 140 - 400 K/uL   MPV 9.7 7.5 - 12.5 fL   Neutro Abs 7,566 1,500 - 7,800 cells/uL   Lymphs Abs 970 850 - 3,900 cells/uL   Monocytes Absolute 970 (H) 200 - 950 cells/uL   Eosinophils Absolute 194 15 - 500 cells/uL   Basophils Absolute 0 0 - 200 cells/uL   Neutrophils Relative % 78 %   Lymphocytes Relative 10 %   Monocytes Relative 10 %   Eosinophils Relative 2 %   Basophils Relative 0 %   Smear Review Criteria for review not met   Insulin, fasting     Status: None   Collection Time: 01/13/17  9:27 AM  Result Value Ref Range   Insulin fasting, serum 8.8 2.0 - 19.6 uIU/mL    Comment:   This insulin assay shows strong cross-reactivity for some insulin analogs (lispro, aspart, and glargine) and much lower cross-reactivity with others (detemir, glulisine).   Stimulated Insulin reference intervals were established using the Siemens Immulite assay. These values are provided for general guidance only.   Hemoglobin A1c     Status: None   Collection Time: 01/13/17  9:27 AM  Result Value Ref Range   Hgb A1c MFr Bld 5.1 <5.7 %    Comment:   For the purpose of screening for the presence of diabetes:   <5.7%       Consistent with the absence of diabetes 5.7-6.4 %   Consistent with increased risk for diabetes (prediabetes) >=6.5 %     Consistent with diabetes   This assay result is consistent with a decreased risk of diabetes.   Currently, no consensus exists regarding use of hemoglobin A1c for diagnosis of diabetes in children.   According to American Diabetes Association (ADA)  guidelines, hemoglobin A1c <7.0% represents optimal control in non-pregnant diabetic patients. Different metrics may apply to specific patient populations. Standards of Medical Care in Diabetes (ADA).      Mean Plasma Glucose 100 mg/dL  Lipid panel     Status: Abnormal   Collection Time: 01/13/17  9:27 AM  Result Value Ref Range   Cholesterol 144 <200 mg/dL   Triglycerides 115 <150 mg/dL   HDL 34 (L) >40 mg/dL   Total CHOL/HDL Ratio 4.2 <5.0 Ratio   VLDL 23 <30 mg/dL   LDL Cholesterol 87 <100 mg/dL    PHQ2/9: Depression screen [facility] 2/9 02/11/2017 01/04/2017 08/14/2016 02/18/2016 01/02/2016  Decreased Interest 0 0 1 2 0  Down, Depressed, Hopeless 0 0 1 2 0  PHQ - 2 Score 0 0 2 4 0  Altered sleeping - - 1 3 -  Tired, decreased energy - - 0 1 -  Change in appetite - - 0 0 -  Feeling bad or failure about yourself  - - 0 0 -  Trouble concentrating - - 0 1 -  Moving slowly or fidgety/restless - - 0 0 -  Suicidal thoughts - - 0 0 -  PHQ-9 Score - - 3 9 -  Difficult doing work/chores - - Somewhat difficult Somewhat difficult -    Fall Risk: Fall Risk  01/04/2017 01/02/2016 10/01/2015 08/28/2015 04/03/2015  Falls in the past year? _0     Assessment & Plan  1. Perennial allergic rhinitis with seasonal variation  - levocetirizine (XYZAL) 5 MG  tablet; Take 1 tablet (5 mg total) by mouth every evening.  Dispense: 30 tablet; Refill: 5  2. Obsessive-compulsive disorder, unspecified type  - FLUoxetine (PROZAC) 20 MG tablet; Take 1 tablet (20 mg total) by mouth daily.  Dispense: 30 tablet; Refill: 1  3. Asthma, cough variant  - montelukast (SINGULAIR) 10 MG tablet; Take 1 tablet (10 mg total) by mouth at bedtime.  Dispense: 30 tablet; Refill: 5 We will try a sample of Advair for 2 weeks,he will call back for CXR if no improvement   4. Depression, major, recurrent, mild ([place])  We will try Fluoxetine  5. Gastroesophageal reflux disease without esophagitis  Doing well on otc  Ranitidine

## 2017-03-09 ENCOUNTER — Encounter: Payer: Self-pay | Admitting: Family Medicine

## 2017-03-09 ENCOUNTER — Ambulatory Visit (INDEPENDENT_AMBULATORY_CARE_PROVIDER_SITE_OTHER): Payer: Managed Care, Other (non HMO) | Admitting: Family Medicine

## 2017-03-09 VITALS — BP 102/70 | HR 65 | Temp 98.2°F | Resp 16 | Ht 67.0 in | Wt 170.1 lb

## 2017-03-09 DIAGNOSIS — J3501 Chronic tonsillitis: Secondary | ICD-10-CM

## 2017-03-09 DIAGNOSIS — J029 Acute pharyngitis, unspecified: Secondary | ICD-10-CM

## 2017-03-09 LAB — POCT RAPID STREP A (OFFICE): Rapid Strep A Screen: NEGATIVE

## 2017-03-09 MED ORDER — FIRST-DUKES MOUTHWASH MT SUSP: 5.0000 mL | Freq: Three times a day (TID) | OROMUCOSAL | 0 refills | Status: DC

## 2017-03-09 NOTE — Progress Notes (Signed)
Name: Anthony Hale   MRN: [id number]    DOB: [birth year]   Date:03/09/2017       Progress Note  Subjective  Chief Complaint  Chief Complaint  Patient presents with  . Sore Throat    for 4 days    HPI  PT presents for sore throat. He was seen on 01/13/2017 for tonsillitis, took Augmentin, and got a little better, then symptoms returned and he went to an Urgent Care where he received a Z-pack and felt much better. Has been asymptomatic since early July, but symptoms recurred 4 days.  He is experiencing pain and discomfort while swallowing (still very much able to swallow), sore throat, some left sided ear congestion, had some bilateral sinus pressure over the weekend that has since resolved, also had headache over the weekend that has since resolved after taking advil.  No fevers, nausea/vomiting/diarrhea, nasal congestion, chest pain or shortness of breath, no neck pain swelling.  Taking singulair PRN - advised to take it daily as prescribed; also using advair daily, taking Xyzal PRN - advised to take daily until feeling better.   Patient Active Problem List   Diagnosis Date Noted  . Depression, major, recurrent, mild ([place]) 08/28/2015  . Generalized anxiety disorder 08/28/2015  . IBS (irritable bowel syndrome) 04/03/2015  . Fatty tumor 12/31/2014  . Decorative tattoo 12/31/2014  . Asthma, cough variant 12/30/2014  . Insomnia 12/30/2014  . GERD (gastroesophageal reflux disease) 12/30/2014  . Perennial allergic rhinitis with seasonal variation 05/23/2007    Social History  Substance Use Topics  . Smoking status: Never Smoker  . Smokeless tobacco: Former Systems developer    Types: Chew  . Alcohol use 0.0 oz/week     Comment: rarely     Current Outpatient Prescriptions:  .  FLUoxetine (PROZAC) 20 MG tablet, Take 1 tablet (20 mg total) by mouth daily., Disp: 30 tablet, Rfl: 1 .  Fluticasone-Salmeterol (ADVAIR DISKUS) 100-50 MCG/DOSE AEPB, Inhale 1 puff into the lungs 2 (two) times daily.,  Disp: 14 each, Rfl: 0 .  levocetirizine (XYZAL) 5 MG tablet, Take 1 tablet (5 mg total) by mouth every evening., Disp: 30 tablet, Rfl: 5 .  montelukast (SINGULAIR) 10 MG tablet, Take 1 tablet (10 mg total) by mouth at bedtime., Disp: 30 tablet, Rfl: 5 .  omeprazole (PRILOSEC) 20 MG capsule, Take 20 mg by mouth daily., Disp: , Rfl:  .  ranitidine (ZANTAC) 300 MG tablet, Take 1 tablet (300 mg total) by mouth at bedtime., Disp: 30 tablet, Rfl: 2 .  Amino Acids (L-CARNITINE) LIQD, Take 1,100 mg by mouth daily., Disp: , Rfl:  .  Creatine POWD, 1 scoop by Does not apply route daily., Disp: , Rfl:  .  fluticasone (FLONASE) 50 MCG/ACT nasal spray, Place 2 sprays into both nostrils daily. Reported on 08/28/2015 (Patient not taking: Reported on 03/09/2017), Disp: 16 g, Rfl: 2 .  Glutamine 5 g PACK, Take 5 g by mouth daily., Disp: , Rfl:  .  Whey Protein Concentrate POWD, Take 1 each by mouth 2 (two) times daily., Disp: , Rfl:   No Known Allergies  ROS  Constitutional: Negative for fever or weight change.  Respiratory: Negative shortness of breath. Some intermittent cough to clear throat - non-productive HEENT: See HPI   Cardiovascular: Negative for chest pain or palpitations.  Gastrointestinal: Negative for abdominal pain, no bowel changes.  Musculoskeletal: Negative for gait problem or joint swelling.  Skin: Negative for rash.  Neurological: Negative for dizziness or headache.  No  other specific complaints in a complete review of systems (except as listed in HPI above).  Objective  Vitals:   03/09/17 1120  BP: 102/70  Pulse: 65  Resp: 16  Temp: 98.2 F (36.8 C)  TempSrc: Oral  SpO2: 96%  Weight: 170 lb 1.6 oz (77.2 kg)  Height: '5\' 7"'$  (1.702 m)   Body mass index is 26.64 kg/m.  Nursing Note and Vital Signs reviewed.  Physical Exam  Constitutional: Patient appears well-developed and well-nourished. No distress.  HEENT: head atraumatic, normocephalic, pupils equal and reactive to  light, EOM's intact, TM's without erythema or bulging, no maxillary or frontal sinus pain on palpation, neck supple without lymphadenopathy, oropharynx erythematous slightly enlarged  tonsils, moist with cobblestoning present. Cardiovascular: Normal rate, regular rhythm, S1/S2 present.  No murmur or rub heard. No BLE edema. Pulmonary/Chest: Effort normal and breath sounds clear. No respiratory distress or retractions. Psychiatric: Patient has a normal mood and affect. behavior is normal. Judgment and thought content normal.  Recent Results (from the past 2160 hour(s))  POCT rapid strep A     Status: None   Collection Time: 01/13/17  9:18 AM  Result Value Ref Range   Rapid Strep A Screen Negative Negative  COMPLETE METABOLIC PANEL WITH GFR     Status: None   Collection Time: 01/13/17  9:27 AM  Result Value Ref Range   Sodium 140 135 - 146 mmol/L   Potassium 4.4 3.5 - 5.3 mmol/L   Chloride 103 98 - 110 mmol/L   CO2 30 20 - 31 mmol/L   Glucose, Bld 99 65 - 99 mg/dL   BUN 9 7 - 25 mg/dL   Creat 0.99 0.60 - 1.35 mg/dL   Total Bilirubin 0.5 0.2 - 1.2 mg/dL   Alkaline Phosphatase 91 40 - 115 U/L   AST 12 10 - 40 U/L   ALT 19 9 - 46 U/L   Total Protein 6.8 6.1 - 8.1 g/dL   Albumin 4.3 3.6 - 5.1 g/dL   Calcium 9.2 8.6 - 10.3 mg/dL   GFR, Est African American >89 >=60 mL/min   GFR, Est Non African American >89 >=60 mL/min  CBC with Differential/Platelet     Status: Abnormal   Collection Time: 01/13/17  9:27 AM  Result Value Ref Range   WBC 9.7 3.8 - 10.8 K/uL   RBC 5.16 4.20 - 5.80 MIL/uL   Hemoglobin 15.4 13.2 - 17.1 g/dL   HCT 45.3 38.5 - 50.0 %   MCV 87.8 80.0 - 100.0 fL   MCH 29.8 27.0 - 33.0 pg   MCHC 34.0 32.0 - 36.0 g/dL   RDW 13.2 11.0 - 15.0 %   Platelets 220 140 - 400 K/uL   MPV 9.7 7.5 - 12.5 fL   Neutro Abs 7,566 1,500 - 7,800 cells/uL   Lymphs Abs 970 850 - 3,900 cells/uL   Monocytes Absolute 970 (H) 200 - 950 cells/uL   Eosinophils Absolute 194 15 - 500 cells/uL    Basophils Absolute 0 0 - 200 cells/uL   Neutrophils Relative % 78 %   Lymphocytes Relative 10 %   Monocytes Relative 10 %   Eosinophils Relative 2 %   Basophils Relative 0 %   Smear Review Criteria for review not met   Insulin, fasting     Status: None   Collection Time: 01/13/17  9:27 AM  Result Value Ref Range   Insulin fasting, serum 8.8 2.0 - 19.6 uIU/mL    Comment:   This  insulin assay shows strong cross-reactivity for some insulin analogs (lispro, aspart, and glargine) and much lower cross-reactivity with others (detemir, glulisine).   Stimulated Insulin reference intervals were established using the Siemens Immulite assay. These values are provided for general guidance only.   Hemoglobin A1c     Status: None   Collection Time: 01/13/17  9:27 AM  Result Value Ref Range   Hgb A1c MFr Bld 5.1 <5.7 %    Comment:   For the purpose of screening for the presence of diabetes:   <5.7%       Consistent with the absence of diabetes 5.7-6.4 %   Consistent with increased risk for diabetes (prediabetes) >=6.5 %     Consistent with diabetes   This assay result is consistent with a decreased risk of diabetes.   Currently, no consensus exists regarding use of hemoglobin A1c for diagnosis of diabetes in children.   According to American Diabetes Association (ADA) guidelines, hemoglobin A1c <7.0% represents optimal control in non-pregnant diabetic patients. Different metrics may apply to specific patient populations. Standards of Medical Care in Diabetes (ADA).      Mean Plasma Glucose 100 mg/dL  Lipid panel     Status: Abnormal   Collection Time: 01/13/17  9:27 AM  Result Value Ref Range   Cholesterol 144 <200 mg/dL   Triglycerides 115 <150 mg/dL   HDL 34 (L) >40 mg/dL   Total CHOL/HDL Ratio 4.2 <5.0 Ratio   VLDL 23 <30 mg/dL   LDL Cholesterol 87 <100 mg/dL  POCT rapid strep A     Status: None   Collection Time: 03/09/17 11:26 AM  Result Value Ref Range   Rapid Strep A  Screen Negative Negative    Assessment & Plan  1. Tonsillitis, chronic - Ambulatory referral to ENT - Diphenhyd-Hydrocort-Nystatin (FIRST-DUKES MOUTHWASH) SUSP; Use as directed 5 mLs in the mouth or throat 4 (four) times daily - after meals and at bedtime.  Dispense: 237 mL; Refill: 0  2. Sore throat - POCT rapid strep A - Ambulatory referral to ENT - Diphenhyd-Hydrocort-Nystatin (FIRST-DUKES MOUTHWASH) SUSP; Use as directed 5 mLs in the mouth or throat 4 (four) times daily - after meals and at bedtime.  Dispense: 237 mL; Refill: 0

## 2017-03-10 ENCOUNTER — Other Ambulatory Visit: Payer: Self-pay | Admitting: Family Medicine

## 2017-03-10 ENCOUNTER — Telehealth: Payer: Self-pay | Admitting: Family Medicine

## 2017-03-10 DIAGNOSIS — J029 Acute pharyngitis, unspecified: Secondary | ICD-10-CM

## 2017-03-10 MED ORDER — MAGIC MOUTHWASH W/LIDOCAINE: 5.0000 mL | Freq: Three times a day (TID) | ORAL | 0 refills | Status: DC

## 2017-03-10 NOTE — Telephone Encounter (Signed)
Received request from [facility] [employer] to send Rx for alternative to [facility]ocaine [facility] [address] because they are out of Lidocaine and [facility] mouthwash is pre-packaged and cannot be mixed. This is done per orders.

## 2017-03-10 NOTE — Progress Notes (Unsigned)
Receieved [facility] from [facility] to adapt [facility] mouthwash to alternative medication due to cost. Spoke with representative from [facility], and alternative is ordered/

## 2017-03-25 ENCOUNTER — Encounter: Payer: Self-pay | Admitting: Family Medicine

## 2017-03-25 ENCOUNTER — Ambulatory Visit (INDEPENDENT_AMBULATORY_CARE_PROVIDER_SITE_OTHER): Payer: Managed Care, Other (non HMO) | Admitting: Family Medicine

## 2017-03-25 VITALS — BP 118/78 | HR 61 | Temp 98.3°F | Resp 18 | Ht 67.0 in | Wt 168.6 lb

## 2017-03-25 DIAGNOSIS — F330 Major depressive disorder, recurrent, mild: Secondary | ICD-10-CM

## 2017-03-25 DIAGNOSIS — J45991 Cough variant asthma: Secondary | ICD-10-CM | POA: Diagnosis not present

## 2017-03-25 DIAGNOSIS — Z23 Encounter for immunization: Secondary | ICD-10-CM | POA: Diagnosis not present

## 2017-03-25 DIAGNOSIS — K219 Gastro-esophageal reflux disease without esophagitis: Secondary | ICD-10-CM

## 2017-03-25 DIAGNOSIS — J3089 Other allergic rhinitis: Secondary | ICD-10-CM | POA: Diagnosis not present

## 2017-03-25 DIAGNOSIS — F429 Obsessive-compulsive disorder, unspecified: Secondary | ICD-10-CM

## 2017-03-25 DIAGNOSIS — E785 Hyperlipidemia, unspecified: Secondary | ICD-10-CM | POA: Diagnosis not present

## 2017-03-25 DIAGNOSIS — E786 Lipoprotein deficiency: Secondary | ICD-10-CM | POA: Diagnosis not present

## 2017-03-25 DIAGNOSIS — J302 Other seasonal allergic rhinitis: Secondary | ICD-10-CM

## 2017-03-25 MED ORDER — FLUOXETINE HCL 20 MG PO TABS: 20.0000 mg | ORAL_TABLET | Freq: Every day | ORAL | 2 refills | Status: DC

## 2017-03-25 NOTE — Progress Notes (Signed)
Name: Anthony Hale   MRN: [id number]    DOB: 1986-03-16   Date:03/25/2017       Progress Note  Subjective  Chief Complaint  Chief Complaint  Patient presents with  . Depression    6 week follow up pt stated that he is still having a few issues but that the medication is helping     HPI  OCD: he states that over the past 2 years he has noticed intrusive thoughts, has to double check doors, making sure the alarm is set before going to bed, when driving needs to drive back to make sure he did not hit something on the road. He needs to count to 4-6 times when double checking anything ( like the alarm, door - needs to be an even number)  He needs to organized everything in alphabetical order, books on a shelf has to be from large size to smaller size. He organizes his clothes by color. He gets irritated when things are not organized. Starting to affect his life, he states co-workers call him weird and OCD. Prozac helps the symptoms but not completely eliminate it. Discussed therapy sessions, he states he will try to go back.   Depression/Anxiety: he took Prozac in the past and started to feel down again Summer of 2018 and asked to resume medication. He was not sleeping well, he was worried about getting divorce finalized. He has sleeping better, no longer feeling down and denies crying spells. Denies suicidal thoughts or ideation  Dyslipidemia, low HDL: he needs to go back to the gym, also discussed dietary modification  Cough variant asthma: he has a long history of cough variant asthma, we started him on Singulair daily and states not recent cough, wheezing or SOB.    Patient Active Problem List   Diagnosis Date Noted  . Depression, major, recurrent, mild ([place]) 08/28/2015  . Generalized anxiety disorder 08/28/2015  . IBS (irritable bowel syndrome) 04/03/2015  . Fatty tumor 12/31/2014  . Decorative tattoo 12/31/2014  . Asthma, cough variant 12/30/2014  . Insomnia 12/30/2014  . GERD  (gastroesophageal reflux disease) 12/30/2014  . Perennial allergic rhinitis with seasonal variation 05/23/2007    Past Surgical History:  Procedure Laterality Date  . WISDOM TOOTH EXTRACTION      Family History  Problem Relation Age of Onset  . Cancer Mother        Breast  . Hyperlipidemia Mother   . Heart disease Father   . Hyperlipidemia Father   . Hypertension Father     Social History   Social History  . Marital status: Legally Separated    Spouse name: N/A  . Number of children: N/A  . Years of education: N/A   Occupational History  . police officer    Social History Main Topics  . Smoking status: Never Smoker  . Smokeless tobacco: Former Systems developer    Types: Chew  . Alcohol use 0.0 oz/week     Comment: rarely  . Drug use: No  . Sexual activity: Not Currently    Partners: Female   Other Topics Concern  . Not on file   Social History Narrative   Married on March 19th, 2017, marriage was never consummated and she left on June 25 th, 2017     Current Outpatient Prescriptions:  .  Amino Acids (L-CARNITINE) LIQD, Take 1,100 mg by mouth daily., Disp: , Rfl:  .  Creatine POWD, 1 scoop by Does not apply route daily., Disp: , Rfl:  .  FLUoxetine (PROZAC) 20 MG tablet, Take 1 tablet (20 mg total) by mouth daily., Disp: 30 tablet, Rfl: 2 .  fluticasone (FLONASE) 50 MCG/ACT nasal spray, Place 2 sprays into both nostrils daily. Reported on 08/28/2015, Disp: 16 g, Rfl: 2 .  Glutamine 5 g PACK, Take 5 g by mouth daily., Disp: , Rfl:  .  levocetirizine (XYZAL) 5 MG tablet, Take 1 tablet (5 mg total) by mouth every evening., Disp: 30 tablet, Rfl: 5 .  montelukast (SINGULAIR) 10 MG tablet, Take 1 tablet (10 mg total) by mouth at bedtime., Disp: 30 tablet, Rfl: 5 .  ranitidine (ZANTAC) 300 MG tablet, Take 1 tablet (300 mg total) by mouth at bedtime., Disp: 30 tablet, Rfl: 2 .  Whey Protein Concentrate POWD, Take 1 each by mouth 2 (two) times daily., Disp: , Rfl:   No Known  Allergies   ROS  Constitutional: Negative for fever or weight change.  Respiratory: Negative for cough and shortness of breath.   Cardiovascular: Negative for chest pain or palpitations.  Gastrointestinal: Negative for abdominal pain, no bowel changes.  Musculoskeletal: Negative for gait problem or joint swelling.  Skin: Negative for rash.  Neurological: Negative for dizziness or headache.  No other specific complaints in a complete review of systems (except as listed in HPI above).  Objective  Vitals:   03/25/17 0848  BP: 118/78  Pulse: 61  Resp: 18  Temp: 98.3 F (36.8 C)  SpO2: 98%  Weight: 168 lb 9 oz (76.5 kg)  Height: '5\' 7"'$  (1.702 m)    Body mass index is 26.4 kg/m.  Physical Exam  Constitutional: Patient appears well-developed and well-nourished. Overweight.  No distress.  HEENT: head atraumatic, normocephalic, pupils equal and reactive to light,  neck supple, throat within normal limits Cardiovascular: Normal rate, regular rhythm and normal heart sounds.  No murmur heard. No BLE edema. Pulmonary/Chest: Effort normal and breath sounds normal. No respiratory distress. Abdominal: Soft.  There is no tenderness. Psychiatric: Patient has a normal mood and affect. behavior is normal. Judgment and thought content normal.  Recent Results (from the past 2160 hour(s))  POCT rapid strep A     Status: None   Collection Time: 01/13/17  9:18 AM  Result Value Ref Range   Rapid Strep A Screen Negative Negative  COMPLETE METABOLIC PANEL WITH GFR     Status: None   Collection Time: 01/13/17  9:27 AM  Result Value Ref Range   Sodium 140 135 - 146 mmol/L   Potassium 4.4 3.5 - 5.3 mmol/L   Chloride 103 98 - 110 mmol/L   CO2 30 20 - 31 mmol/L   Glucose, Bld 99 65 - 99 mg/dL   BUN 9 7 - 25 mg/dL   Creat 0.99 0.60 - 1.35 mg/dL   Total Bilirubin 0.5 0.2 - 1.2 mg/dL   Alkaline Phosphatase 91 40 - 115 U/L   AST 12 10 - 40 U/L   ALT 19 9 - 46 U/L   Total Protein 6.8 6.1 - 8.1  g/dL   Albumin 4.3 3.6 - 5.1 g/dL   Calcium 9.2 8.6 - 10.3 mg/dL   GFR, Est African American >89 >=60 mL/min   GFR, Est Non African American >89 >=60 mL/min  CBC with Differential/Platelet     Status: Abnormal   Collection Time: 01/13/17  9:27 AM  Result Value Ref Range   WBC 9.7 3.8 - 10.8 K/uL   RBC 5.16 4.20 - 5.80 MIL/uL   Hemoglobin 15.4 13.2 -  17.1 g/dL   HCT 96.4 84.7 - 42.2 %   MCV 87.8 80.0 - 100.0 fL   MCH 29.8 27.0 - 33.0 pg   MCHC 34.0 32.0 - 36.0 g/dL   RDW 94.6 39.2 - 02.2 %   Platelets 220 140 - 400 K/uL   MPV 9.7 7.5 - 12.5 fL   Neutro Abs 7,566 1,500 - 7,800 cells/uL   Lymphs Abs 970 850 - 3,900 cells/uL   Monocytes Absolute 970 (H) 200 - 950 cells/uL   Eosinophils Absolute 194 15 - 500 cells/uL   Basophils Absolute 0 0 - 200 cells/uL   Neutrophils Relative % 78 %   Lymphocytes Relative 10 %   Monocytes Relative 10 %   Eosinophils Relative 2 %   Basophils Relative 0 %   Smear Review Criteria for review not met   Insulin, fasting     Status: None   Collection Time: 01/13/17  9:27 AM  Result Value Ref Range   Insulin fasting, serum 8.8 2.0 - 19.6 uIU/mL    Comment:   This insulin assay shows strong cross-reactivity for some insulin analogs (lispro, aspart, and glargine) and much lower cross-reactivity with others (detemir, glulisine).   Stimulated Insulin reference intervals were established using the Siemens Immulite assay. These values are provided for general guidance only.   Hemoglobin A1c     Status: None   Collection Time: 01/13/17  9:27 AM  Result Value Ref Range   Hgb A1c MFr Bld 5.1 <5.7 %    Comment:   For the purpose of screening for the presence of diabetes:   <5.7%       Consistent with the absence of diabetes 5.7-6.4 %   Consistent with increased risk for diabetes (prediabetes) >=6.5 %     Consistent with diabetes   This assay result is consistent with a decreased risk of diabetes.   Currently, no consensus exists regarding use of  hemoglobin A1c for diagnosis of diabetes in children.   According to American Diabetes Association (ADA) guidelines, hemoglobin A1c <7.0% represents optimal control in non-pregnant diabetic patients. Different metrics may apply to specific patient populations. Standards of Medical Care in Diabetes (ADA).      Mean Plasma Glucose 100 mg/dL  Lipid panel     Status: Abnormal   Collection Time: 01/13/17  9:27 AM  Result Value Ref Range   Cholesterol 144 <200 mg/dL   Triglycerides 576 <986 mg/dL   HDL 34 (L) >43 mg/dL   Total CHOL/HDL Ratio 4.2 <5.0 Ratio   VLDL 23 <30 mg/dL   LDL Cholesterol 87 <677 mg/dL  POCT rapid strep A     Status: None   Collection Time: 03/09/17 11:26 AM  Result Value Ref Range   Rapid Strep A Screen Negative Negative     PHQ2/9: Depression screen [facility] 2/9 02/11/2017 01/04/2017 08/14/2016 02/18/2016 01/02/2016  Decreased Interest 0 0 1 2 0  Down, Depressed, Hopeless 0 0 1 2 0  PHQ - 2 Score 0 0 2 4 0  Altered sleeping - - 1 3 -  Tired, decreased energy - - 0 1 -  Change in appetite - - 0 0 -  Feeling bad or failure about yourself  - - 0 0 -  Trouble concentrating - - 0 1 -  Moving slowly or fidgety/restless - - 0 0 -  Suicidal thoughts - - 0 0 -  PHQ-9 Score - - 3 9 -  Difficult doing work/chores - - Somewhat difficult Somewhat  difficult -     Fall Risk: Fall Risk  01/04/2017 01/02/2016 10/01/2015 08/28/2015 04/03/2015  Falls in the past year? No No No No No     Assessment & Plan  1. Asthma, cough variant  Doing well, cough resolved with Singulair  2. Perennial allergic rhinitis with seasonal variation  Taking Xyzal and singulair daily and is doing well  3. Obsessive-compulsive disorder, unspecified type  Symptoms improved, still has to drive back when driving alone to make sure did not hit anyone, but not as frequent - FLUoxetine (PROZAC) 20 MG tablet; Take 1 tablet (20 mg total) by mouth daily.  Dispense: 30 tablet; Refill: 2  4. Depression,  major, recurrent, mild ([facility])  - FLUoxetine (PROZAC) 20 MG tablet; Take 1 tablet (20 mg total) by mouth daily.  Dispense: 30 tablet; Refill: 2  5. Gastroesophageal reflux disease without esophagitis  Doing well on Ranitidine  6. Dyslipidemia  Low HDL   7. Low HDL (under 40)  Lipid panel shows low HDL : to improve HDL patient  needs to eat tree nuts ( pecans/pistachios/almonds ) four times weekly, eat fish two times weekly  and exercise  at least 150 minutes per week

## 2017-06-24 ENCOUNTER — Encounter: Payer: Self-pay | Admitting: Family Medicine

## 2017-06-24 ENCOUNTER — Ambulatory Visit: Payer: Managed Care, Other (non HMO) | Admitting: Family Medicine

## 2017-06-24 VITALS — BP 100/80 | HR 66 | Resp 12 | Ht 67.0 in | Wt 180.7 lb

## 2017-06-24 DIAGNOSIS — E785 Hyperlipidemia, unspecified: Secondary | ICD-10-CM | POA: Diagnosis not present

## 2017-06-24 DIAGNOSIS — J3089 Other allergic rhinitis: Secondary | ICD-10-CM | POA: Diagnosis not present

## 2017-06-24 DIAGNOSIS — E786 Lipoprotein deficiency: Secondary | ICD-10-CM

## 2017-06-24 DIAGNOSIS — F429 Obsessive-compulsive disorder, unspecified: Secondary | ICD-10-CM | POA: Diagnosis not present

## 2017-06-24 DIAGNOSIS — J45991 Cough variant asthma: Secondary | ICD-10-CM

## 2017-06-24 DIAGNOSIS — F330 Major depressive disorder, recurrent, mild: Secondary | ICD-10-CM

## 2017-06-24 DIAGNOSIS — K219 Gastro-esophageal reflux disease without esophagitis: Secondary | ICD-10-CM

## 2017-06-24 DIAGNOSIS — J302 Other seasonal allergic rhinitis: Secondary | ICD-10-CM

## 2017-06-24 MED ORDER — ALBUTEROL SULFATE HFA 108 (90 BASE) MCG/ACT IN AERS: 2.0000 | INHALATION_SPRAY | Freq: Four times a day (QID) | RESPIRATORY_TRACT | 0 refills | Status: DC | PRN

## 2017-06-24 NOTE — Progress Notes (Signed)
Name: Anthony Hale   MRN: [id number]    DOB: 06-30-1986   Date:06/24/2017       Progress Note  Subjective  Chief Complaint  Chief Complaint  Patient presents with  . Asthma  . Depression    HPI    OCD: he states that over the past 2 years he has noticed intrusive thoughts, has to double check doors, making sure the alarm is set before going to bed, when driving needs to drive back to make sure he did not hit something on the road. He needs to count to 4-6 times when double checking anything ( like the alarm, door - needs to be an even number)  He needs to organized everything in alphabetical order, books on a shelf has to be from large size to smaller size. He organizes his clothes by color. He gets irritated when things are not organized. Starting to affect his life, he states co-workers call him weird and OCD. He felt like symptoms were  better controlled with Prozac, but since depression and anxiety was down so he stopped taking Prozac and has noticed that he has to recheck things again.   Depression/Anxiety: he took Prozac in the past and started to feel down again Summer of 2018 and asked to resume medication, he states stress is down, divorced is final and he is less stressed. He denies difficulty falling asleep but wakes up most nights, but able to fall back asleep sometimes within 15 minutes but other times it can take up to on hour. He does not want to take medications for depression or sleep at this time. Dating again. 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 . He notices some symptoms of wheezing and bronchospasm with very cold weather, however symptoms are worse during the Spring and Summer and is not currently taking Singulair Discussed using inhaler prn and he is willing to try.     Patient Active Problem List   Diagnosis Date Noted  . Depression, major, recurrent, mild ([place]) 08/28/2015  . Generalized anxiety disorder 08/28/2015  . IBS (irritable bowel syndrome) 04/03/2015  . Fatty tumor 12/31/2014  . Decorative tattoo 12/31/2014  . Asthma, cough variant 12/30/2014  . Insomnia 12/30/2014  . GERD (gastroesophageal reflux disease) 12/30/2014  . Perennial allergic rhinitis with seasonal variation 05/23/2007    Past Surgical History:  Procedure Laterality Date  . WISDOM TOOTH EXTRACTION      Family History  Problem Relation Age of Onset  . Cancer Mother        Breast  . Hyperlipidemia Mother   . Heart disease Father   . Hyperlipidemia Father   . Hypertension Father     Social History   Socioeconomic History  . Marital status: Legally Separated    Spouse name: Not on file  . Number of children: Not on file  . Years of education: Not on file  . Highest education level: Not on file  Social Needs  . Financial resource strain: Not on file  . Food insecurity - worry: Not on file  . Food insecurity - inability: Not on file  . Transportation needs - medical: Not on file  . Transportation needs - non-medical: Not on file  Occupational History  . Occupation: Engineer, structural  Tobacco Use  . Smoking  status: Never Smoker  . Smokeless tobacco: Former Systems developer    Types: Chew  Substance and Sexual Activity  . Alcohol use: Yes    Alcohol/week: 0.0 oz    Comment: rarely  . Drug use: No  . Sexual activity: Not Currently    Partners: Female  Other Topics Concern  . Not on file  Social History Narrative   Married on March 19th, 2017, marriage was never consummated and she left on June 25 th, 2017   Divorced Summer 2018   Still work as a Nutritional therapist for [employer]     Current Outpatient Medications:  .  levocetirizine (XYZAL) 5 MG tablet, Take 1 tablet (5 mg total) by mouth every evening., Disp: 30 tablet, Rfl: 5 .  montelukast  (SINGULAIR) 10 MG tablet, Take 1 tablet (10 mg total) by mouth at bedtime., Disp: 30 tablet, Rfl: 5 .  FLUoxetine (PROZAC) 20 MG tablet, Take 1 tablet (20 mg total) by mouth daily. (Patient not taking: Reported on 06/24/2017), Disp: 30 tablet, Rfl: 2 .  fluticasone (FLONASE) 50 MCG/ACT nasal spray, Place 2 sprays into both nostrils daily. Reported on 08/28/2015 (Patient not taking: Reported on 06/24/2017), Disp: 16 g, Rfl: 2 .  ranitidine (ZANTAC) 300 MG tablet, Take 1 tablet (300 mg total) by mouth at bedtime. (Patient not taking: Reported on 06/24/2017), Disp: 30 tablet, Rfl: 2  No Known Allergies   ROS  Constitutional: Negative for fever or weight change.  Respiratory: Negative for cough and shortness of breath.   Cardiovascular: Negative for chest pain or palpitations.  Gastrointestinal: Negative for abdominal pain, no bowel changes.  Musculoskeletal: Negative for gait problem or joint swelling.  Skin: Negative for rash.  Neurological: Negative for dizziness or headache.  No other specific complaints in a complete review of systems (except as listed in HPI above).  Objective  Vitals:   06/24/17 0743  BP: 100/80  Pulse: 66  Resp: 12  SpO2: 98%  Weight: 180 lb 11.2 oz (82 kg)  Height: 5\' 7"  (1.702 m)    Body mass index is 28.3 kg/m.  Physical Exam  Constitutional: Patient appears well-developed and well-nourished. Obese No distress.  HEENT: head atraumatic, normocephalic, pupils equal and reactive to light, neck supple, throat within normal limits Cardiovascular: Normal rate, regular rhythm and normal heart sounds.  No murmur heard. No BLE edema. Pulmonary/Chest: Effort normal and breath sounds normal. No respiratory distress. Abdominal: Soft.  There is no tenderness. Psychiatric: Patient has a normal mood and affect. behavior is normal. Judgment and thought content normal.  PHQ2/9: Depression screen [facility] [place] 2/9 06/24/2017 02/11/2017 01/04/2017 08/14/2016 02/18/2016  Decreased  Interest 0 0 0 1 2  Down, Depressed, Hopeless 0 0 0 1 2  PHQ - 2 Score 0 0 0 2 4  Altered sleeping 1 - - 1 3  Tired, decreased energy 1 - - 0 1  Change in appetite 0 - - 0 0  Feeling bad or failure about yourself  0 - - 0 0  Trouble concentrating 1 - - 0 1  Moving slowly or fidgety/restless 0 - - 0 0  Suicidal thoughts 0 - - 0 0  PHQ-9 Score 3 - - 3 9  Difficult doing work/chores Not difficult at all - - Somewhat difficult Somewhat difficult    Fall Risk: Fall Risk  01/04/2017 01/02/2016 10/01/2015 08/28/2015 04/03/2015  Falls in the past year? No No No No No     Functional Status Survey: Is the patient deaf  or have difficulty hearing?: No Does the patient have difficulty seeing, even when wearing glasses/contacts?: No Does the patient have difficulty concentrating, remembering, or making decisions?: No Does the patient have difficulty walking or climbing stairs?: No Does the patient have difficulty dressing or bathing?: No Does the patient have difficulty doing errands alone such as visiting a doctor's office or shopping?: No    Assessment & Plan  1. Asthma, cough variant  - albuterol (PROVENTIL HFA;VENTOLIN HFA) 108 (90 Base) MCG/ACT inhaler; Inhale 2 puffs into the lungs every 6 (six) hours as needed for wheezing or shortness of breath.  Dispense: 1 Inhaler; Refill: 0  2. Dyslipidemia  Lipid panel shows low HDL : to improve HDL patient  needs to eat tree nuts ( pecans/pistachios/almonds ) four times weekly, eat fish two times weekly  and exercise  at least 150 minutes per week  3. Depression, major, recurrent, mild ([place])  Off medication, but still has rx at home and will resume if he noticed symptoms getting worse, still not in remission   4. Gastroesophageal reflux disease without esophagitis  Taking prn medication   5. Perennial allergic rhinitis with seasonal variation  Worse in the Spring and Summer  6. Low HDL (under 40)  Discussed life style modification     7. Obsessive-compulsive disorder, unspecified type  Discussed going back on Prozac but he wants to hold off for now

## 2017-10-11 ENCOUNTER — Encounter: Payer: Self-pay | Admitting: Family Medicine

## 2017-10-11 ENCOUNTER — Ambulatory Visit: Payer: Self-pay | Admitting: Family Medicine

## 2017-10-11 VITALS — BP 130/80 | HR 75 | Temp 98.4°F | Resp 16 | Ht 65.0 in | Wt 173.0 lb

## 2017-10-11 DIAGNOSIS — Z0189 Encounter for other specified special examinations: Principal | ICD-10-CM

## 2017-10-11 DIAGNOSIS — Z008 Encounter for other general examination: Secondary | ICD-10-CM

## 2017-10-11 LAB — GLUCOSE, POCT (MANUAL RESULT ENTRY): POC GLUCOSE: 98 mg/dL (ref 70–99)

## 2017-10-11 NOTE — Progress Notes (Signed)
Subjective: Annual biometrics screening  Patient presents for his annual biometric screening. Patient has a history of OCD and allergic rhinitis.  Both well controlled according to the patient.  Denies any complaints. PCP: Dr. Ancil Boozer. Patient works Adult nurse. Patient denies any other issues or concerns.   Review of Systems Constitutional: Unremarkable.  HEENT: Denies dizziness, issues with hearing, vision problems.  Gastrointestinal: Denies issues with bowel or bladder.  Respiratory: Unremarkable.   Cardiovascular: Unremarkable.  Musculoskeletal: No significant arthralgias, myalgias, joint swelling, joint stiffness, back pain, neck pain. ROS otherwise negative.   Objective  Physical Exam General: Awake, alert and oriented. No acute distress. Well developed, hydrated and nourished. Appears stated age.  HEENT:  Supple neck without adenopathy. Sclera is non-icteric. The ear canal is clear without discharge. The tympanic membrane is normal in appearance with normal landmarks and cone of light. Nasal mucosa is pink and moist. Oral mucosa is pink and moist. The pharynx is normal in appearance without tonsillar swelling or exudates.  Skin: Skin in warm, dry and intact without rashes or lesions. Appropriate color for ethnicity. Cardiac: Heart rate and rhythm are normal. No murmurs, gallops, or rubs are auscultated.  Respiratory: The chest wall is symmetric and without deformity. No signs of respiratory distress. Lung sounds are clear in all lobes bilaterally without rales, ronchi, or wheezes.  Abdominal: Abdomen is soft, symmetric, and non-tender without distention. No masses, hepatomegaly, or splenomegaly are noted.  Spine: Neck and back are without deformity. Curvature of the cervical, thoracic, and lumbar spine are within normal limits. Posture is upright, gait is smooth, steady, and within normal limits. No tenderness noted on palpation of the spinous processes. Spinous processes are  midline. No discomfort is noted with flexion, extension, and side-to-side rotation of the cervical/thoracic/lumbar spine, full range of motion is noted. Grip strength is normal bilaterally.  Extremities: Full range of motion is noted to all major joints. Steady gait noted.  Neurological: The patient is awake, alert and oriented to person, place, and time with normal speech.  Memory is normal and thought processes intact. No gait abnormalities are appreciated.  Psychiatric: Appropriate mood and affect.   Assessment Annual biometrics screen  Plan  Labs pending. Encouraged routine visits with primary care provider.  Nonfasting blood sugar 98 today.

## 2017-10-12 LAB — LIPID PANEL
CHOL/HDL RATIO: 6.2 ratio — AB (ref 0.0–5.0)
Cholesterol, Total: 206 mg/dL — ABNORMAL HIGH (ref 100–199)
HDL: 33 mg/dL — AB (ref >39)
LDL CALC: 139 mg/dL — AB (ref 0–99)
TRIGLYCERIDES: 170 mg/dL — AB (ref 0–149)
VLDL Cholesterol Cal: 34 mg/dL (ref 5–40)

## 2017-10-12 NOTE — Progress Notes (Signed)
Anthony Hale, Will you please call and inform this patient that his lipid panel came back?  Inform him that his total cholesterol is elevated at 206, normal values are between 100 and 199.  Inform him that his triglycerides are elevated at 170, normal values are between 0 and 149.  Inform him that his HDL cholesterol ("good cholesterol") is low at 33, normal values are above 39.  Inform him that his LDL cholesterol ("bad cholesterol")  is elevated at 139, normal values are between 0 and 99.  These abnormal values increase his cholesterol/HDL ratio to 6.2, normal values are between 0 and 5.  I wanted him to be aware of this because these abnormal values increase his risk for cardiovascular disease and I would like him to discuss this with his primary care provider at his next regularly scheduled visit.

## 2017-11-15 ENCOUNTER — Ambulatory Visit: Payer: Managed Care, Other (non HMO) | Admitting: Family Medicine

## 2017-11-15 ENCOUNTER — Encounter: Payer: Self-pay | Admitting: Family Medicine

## 2017-11-15 VITALS — BP 132/82 | HR 70 | Temp 98.4°F | Resp 18 | Ht 65.0 in | Wt 171.2 lb

## 2017-11-15 DIAGNOSIS — J45991 Cough variant asthma: Secondary | ICD-10-CM | POA: Diagnosis not present

## 2017-11-15 DIAGNOSIS — F429 Obsessive-compulsive disorder, unspecified: Secondary | ICD-10-CM | POA: Diagnosis not present

## 2017-11-15 DIAGNOSIS — J302 Other seasonal allergic rhinitis: Secondary | ICD-10-CM | POA: Diagnosis not present

## 2017-11-15 DIAGNOSIS — E663 Overweight: Secondary | ICD-10-CM | POA: Diagnosis not present

## 2017-11-15 DIAGNOSIS — E786 Lipoprotein deficiency: Secondary | ICD-10-CM

## 2017-11-15 DIAGNOSIS — J3089 Other allergic rhinitis: Secondary | ICD-10-CM

## 2017-11-15 DIAGNOSIS — Z114 Encounter for screening for human immunodeficiency virus [HIV]: Secondary | ICD-10-CM

## 2017-11-15 DIAGNOSIS — E785 Hyperlipidemia, unspecified: Secondary | ICD-10-CM

## 2017-11-15 DIAGNOSIS — F330 Major depressive disorder, recurrent, mild: Secondary | ICD-10-CM | POA: Diagnosis not present

## 2017-11-15 MED ORDER — DIPHENHYDRAMINE HCL 25 MG PO TABS: 25.0000 mg | ORAL_TABLET | Freq: Every evening | ORAL | 0 refills | Status: DC | PRN

## 2017-11-15 NOTE — Progress Notes (Addendum)
Name: Anthony Hale   MRN: [id number]    DOB: 1985-08-01   Date:11/15/2017       Progress Note  Subjective  Chief Complaint  Chief Complaint  Patient presents with  . Medication Refill    4 month F/U  . OCD  . Asthma    Nasal drainage, watery eyes and cough intermittently  . Anxiety    HPI  OCD: he states that over the past2 yearshe has noticed intrusive thoughts, has to double check doors, making sure the alarm is set before going to bed, when driving needs to drive back to make sure he did not hit something on the road. He needs to countto 4-6 times when double checking anything ( like the alarm, door - needs to be an even number)He needs to organized everything in alphabetical order, books on a shelf has to be from large size to smaller size. He organizes his clothes by color. He gets irritated when things are not organized. Starting to affect his life, he states co-workers call him weird and OCD. He felt like symptoms were  better controlled with Prozac, but since depression and anxiety was down so he stopped taking Prozac but does not to take medication for it.   Depression/Anxiety:he took Prozac in the past and started to feel  down again Summer of 2018 and asked to resume medication, he states stress is down, divorced is final and he is less stressed. He also finished his masters recently . He does not want to take medications for depression or sleep at this time. Dating again. Denies suicidal thoughts or ideation.  Dyslipidemia, low HDL: he is exercising at home about 2-3 times a week, but lipid panel reviewed and much worse. 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 .He notices some symptoms of wheezing and bronchospasm with very cold weather, however symptoms are worse during the Spring and Summer, he started taking singulair again about a month ago, he has a mild cough, but no wheezing or SOB.  He used Breo  during winter and it seemed to help with symptoms.    Patient Active Problem List   Diagnosis Date Noted  . Depression, major, recurrent, mild ([place]) 08/28/2015  . Generalized anxiety disorder 08/28/2015  . IBS (irritable bowel syndrome) 04/03/2015  . Fatty tumor 12/31/2014  . Decorative tattoo 12/31/2014  . Asthma, cough variant 12/30/2014  . Insomnia 12/30/2014  . GERD (gastroesophageal reflux disease) 12/30/2014  . Perennial allergic rhinitis with seasonal variation 05/23/2007    Past Surgical History:  Procedure Laterality Date  . WISDOM TOOTH EXTRACTION      Family History  Problem Relation Age of Onset  . Cancer Mother        Breast  . Hyperlipidemia Mother   . Heart disease Father   . Hyperlipidemia Father   . Hypertension Father     Social History   Socioeconomic History  . Marital status: Legally Separated    Spouse name: Not on file  . Number of children: 0  . Years of education: Not on file  . Highest education level: Master's degree (e.g., MA, MS, MEng, MEd, MSW, MBA)  Occupational History  . Occupation: Engineer, structural  Social Needs  . Financial resource strain: Not on file  . Food insecurity:    Worry: Not on file    Inability: Not on file  . Transportation needs:    Medical: Not on file  Non-medical: Not on file  Tobacco Use  . Smoking status: Never Smoker  . Smokeless tobacco: Former Systems developer    Types: Chew  Substance and Sexual Activity  . Alcohol use: Yes    Alcohol/week: 0.0 oz    Comment: rarely  . Drug use: No  . Sexual activity: Not Currently    Partners: Female  Lifestyle  . Physical activity:    Days per week: 3 days    Minutes per session: 40 min  . Stress: Only a little  Relationships  . Social connections:    Talks on phone: More than three times a week    Gets together: Twice a week    Attends religious service: Never    Active member of club or organization: No    Attends meetings of clubs or organizations: Never     Relationship status: Divorced  . Intimate partner violence:    Fear of current or ex partner: No    Emotionally abused: No    Physically abused: No    Forced sexual activity: No  Other Topics Concern  . Not on file  Social History Narrative   Married on March 19th, 2017, marriage was never consummated and she left on June 25 th, 2017   Divorced Summer 2018   Still work as a Nutritional therapist for [employer]   He has a Production assistant, radio in Energy manager and two masters ( Conservator, museum/gallery and criminal justice administration)      Current Outpatient Medications:  .  montelukast (SINGULAIR) 10 MG tablet, Take 1 tablet (10 mg total) by mouth at bedtime., Disp: 30 tablet, Rfl: 5 .  diphenhydrAMINE (BENADRYL ALLERGY) 25 MG tablet, Take 1 tablet (25 mg total) by mouth at bedtime as needed., Disp: 30 tablet, Rfl: 0  No Known Allergies   ROS  Constitutional: Negative for fever or weight change.  Respiratory: Negative for cough and shortness of breath.   Cardiovascular: Negative for chest pain or palpitations.  Gastrointestinal: Negative for abdominal pain, no bowel changes.  Musculoskeletal: Negative for gait problem or joint swelling.  Skin: Negative for rash.  Neurological: Negative for dizziness or headache.  No other specific complaints in a complete review of systems (except as listed in HPI above).  Objective  Vitals:   11/15/17 0942  BP: 132/82  Pulse: 70  Resp: 18  Temp: 98.4 F (36.9 C)  TempSrc: Oral  SpO2: 95%  Weight: 171 lb 3.2 oz (77.7 kg)  Height: 5\' 5"  (1.651 m)    Body mass index is 28.49 kg/m.  Physical Exam  Constitutional: Patient appears well-developed and well-nourished. No distress.  HEENT: head atraumatic, normocephalic, pupils equal and reactive to light, neck supple, throat within normal limits Cardiovascular: Normal rate, regular rhythm and normal heart sounds.  No murmur heard. No BLE edema. Pulmonary/Chest: Effort  normal and breath sounds normal. No respiratory distress. Abdominal: Soft.  There is no tenderness. Psychiatric: Patient has a normal mood and affect. behavior is normal. Judgment and thought content normal.   Recent Results (from the past 2160 hour(s))  POCT Glucose (CBG)-Manual entry (CPT 82948)     Status: Normal   Collection Time: 10/11/17 10:18 AM  Result Value Ref Range   POC Glucose 98 70 - 99 mg/dl  Lipid Profile     Status: Abnormal   Collection Time: 10/11/17 10:31 AM  Result Value Ref Range   Cholesterol, Total 206 (H) 100 - 199 mg/dL   Triglycerides 170 (H) 0 -  149 mg/dL   HDL 33 (L) >39 mg/dL   VLDL Cholesterol Cal 34 5 - 40 mg/dL   LDL Calculated 139 (H) 0 - 99 mg/dL   Chol/HDL Ratio 6.2 (H) 0.0 - 5.0 ratio    Comment:                                   T. Chol/HDL Ratio                                             Men  Women                               1/2 Avg.Risk  3.4    3.3                                   Avg.Risk  5.0    4.4                                2X Avg.Risk  9.6    7.1                                3X Avg.Risk 23.4   11.0      PHQ2/9: Depression screen [facility] 2/9 11/15/2017 06/24/2017 02/11/2017 01/04/2017 08/14/2016  Decreased Interest 2 0 0 0 1  Down, Depressed, Hopeless 0 0 0 0 1  PHQ - 2 Score 2 0 0 0 2  Altered sleeping 2 1 - - 1  Tired, decreased energy 2 1 - - 0  Change in appetite 0 0 - - 0  Feeling bad or failure about yourself  0 0 - - 0  Trouble concentrating 2 1 - - 0  Moving slowly or fidgety/restless 0 0 - - 0  Suicidal thoughts 0 0 - - 0  PHQ-9 Score 8 3 - - 3  Difficult doing work/chores Not difficult at all Not difficult at all - - Somewhat difficult    Fall Risk: Fall Risk  11/15/2017 01/04/2017 01/02/2016 10/01/2015 08/28/2015  Falls in the past year? No No No No No     Functional Status Survey: Is the patient deaf or have difficulty hearing?: No Does the patient have difficulty seeing, even when wearing glasses/contacts?:  Yes(otc reading glasses) Does the patient have difficulty concentrating, remembering, or making decisions?: No Does the patient have difficulty walking or climbing stairs?: No Does the patient have difficulty dressing or bathing?: No Does the patient have difficulty doing errands alone such as visiting a doctor's office or shopping?: No   Assessment & Plan  1. Depression, major, recurrent, mild ([place]  He stopped Prozac and does not want to resume at this time  2. Asthma, cough variant  Taking singulair again, but still has rx at home  3. Obsessive-compulsive disorder, unspecified type  stable  4. Dyslipidemia  Lipid panel got worse ( done at work ), discussed life style modification lately   5. Low HDL (under 40)  Eat tree nuts more often, exercise at least 5 days a week  and increase fish intake  6. Perennial allergic rhinitis with seasonal variation  He stopped Xyzal, taking singular and benadryl only prn  - diphenhydrAMINE (BENADRYL ALLERGY) 25 MG tablet; Take 1 tablet (25 mg total) by mouth at bedtime as needed.  Dispense: 30 tablet; Refill: 0  7. Overweight (BMI 25.0-29.9)  Discussed importance of cutting down on junk food    8. Encounter for screening for HIV  - HIV antibody

## 2017-11-15 NOTE — Addendum Note (Signed)
Addended by: Steele Sizer F on: 11/15/2017 10:31 AM   Modules accepted: Orders

## 2017-11-16 LAB — HIV ANTIBODY (ROUTINE TESTING W REFLEX): HIV: NONREACTIVE

## 2018-01-05 ENCOUNTER — Encounter: Payer: Managed Care, Other (non HMO) | Admitting: Family Medicine

## 2018-03-17 ENCOUNTER — Encounter: Payer: Self-pay | Admitting: Family Medicine

## 2018-03-17 ENCOUNTER — Ambulatory Visit: Payer: Managed Care, Other (non HMO) | Admitting: Family Medicine

## 2018-03-17 VITALS — BP 132/82 | HR 56 | Temp 98.3°F | Resp 16 | Ht 65.0 in | Wt 158.7 lb

## 2018-03-17 DIAGNOSIS — J4521 Mild intermittent asthma with (acute) exacerbation: Secondary | ICD-10-CM

## 2018-03-17 DIAGNOSIS — F429 Obsessive-compulsive disorder, unspecified: Secondary | ICD-10-CM

## 2018-03-17 DIAGNOSIS — F330 Major depressive disorder, recurrent, mild: Secondary | ICD-10-CM | POA: Diagnosis not present

## 2018-03-17 DIAGNOSIS — E785 Hyperlipidemia, unspecified: Secondary | ICD-10-CM

## 2018-03-17 DIAGNOSIS — R739 Hyperglycemia, unspecified: Secondary | ICD-10-CM

## 2018-03-17 DIAGNOSIS — K219 Gastro-esophageal reflux disease without esophagitis: Secondary | ICD-10-CM

## 2018-03-17 DIAGNOSIS — Z23 Encounter for immunization: Secondary | ICD-10-CM

## 2018-03-17 DIAGNOSIS — Z79899 Other long term (current) drug therapy: Secondary | ICD-10-CM

## 2018-03-17 DIAGNOSIS — F411 Generalized anxiety disorder: Secondary | ICD-10-CM

## 2018-03-17 MED ORDER — FLUOXETINE HCL 20 MG PO TABS: 20.0000 mg | ORAL_TABLET | Freq: Every day | ORAL | 0 refills | Status: DC

## 2018-03-17 MED ORDER — BUDESONIDE-FORMOTEROL FUMARATE 160-4.5 MCG/ACT IN AERO: 2.0000 | INHALATION_SPRAY | Freq: Two times a day (BID) | RESPIRATORY_TRACT | 0 refills | Status: DC

## 2018-03-17 NOTE — Progress Notes (Signed)
Name: Anthony Hale   MRN: [id number]    DOB: 1986/01/03   Date:03/17/2018       Progress Note  Subjective  Chief Complaint  Chief Complaint  Patient presents with  . Medication Refill  . Depression  . Asthma    Cough is off and on  . Anxiety  . OCD    HPI  OCD: he states that over the past3 yearshe has noticed intrusive thoughts, has to double check doors, making sure the alarm is set before going to bed, when driving needs to drive back to make sure he did not hit something on the road. He needs to countto 4-6 times when double checking anything ( like the alarm, door - needs to be an even number)He needs to organized everything in alphabetical order, books on a shelf has to be from large size to smaller size. He organizes his clothes by color. He gets irritated when things are not organized. Starting to affect his life, he states co-workers call him weird and OCD.He is back on Prozac and medication seems to help   Depression/Anxiety:he is single again, still wants to leave his job and is trying to pay off his debt first. He is doing okay, he resumed taking medication a few weeks ago when he returned from the beach.. Denies suicidal thoughts or ideation.  Dyslipidemia, low HDL: mother suggested NMR. He is not on medication. No family history of sudden death or heart disease before age 18 for males or pre-menopausal for females. He is trying to avoid fast food, eating more fruit and vegetables, going to the gym a few times a week for one hour.   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 Discussed placing him on Symbicort for about one week and may wean off medication when better, continue singulair.   Patient  Active Problem List   Diagnosis Date Noted  . Depression, major, recurrent, mild ([place]) 08/28/2015  . Generalized anxiety disorder 08/28/2015  . IBS (irritable bowel syndrome) 04/03/2015  . Fatty tumor 12/31/2014  . Decorative tattoo 12/31/2014  . Asthma, cough variant 12/30/2014  . Insomnia 12/30/2014  . GERD (gastroesophageal reflux disease) 12/30/2014  . Perennial allergic rhinitis with seasonal variation 05/23/2007    Past Surgical History:  Procedure Laterality Date  . WISDOM TOOTH EXTRACTION      Family History  Problem Relation Age of Onset  . Cancer Mother        Breast  . Hyperlipidemia Mother   . Heart disease Father   . Hyperlipidemia Father   . Hypertension Father     Social History   Socioeconomic History  . Marital status: Legally Separated    Spouse name: Not on file  . Number of children: 0  . Years of education: Not on file  . Highest education level: Master's degree (e.g., MA, MS, MEng, MEd, MSW, MBA)  Occupational History  . Occupation: Engineer, structural  Social Needs  . Financial resource strain: Not hard at all  . Food insecurity:    Worry: Never true    Inability: Never true  . Transportation needs:    Medical: No    Non-medical: No  Tobacco Use  . Smoking status: Never Smoker  . Smokeless tobacco:  Never Used  Substance and Sexual Activity  . Alcohol use: Yes    Alcohol/week: 0.0 standard drinks    Comment: rarely  . Drug use: No  . Sexual activity: Not Currently    Partners: Female  Lifestyle  . Physical activity:    Days per week: 3 days    Minutes per session: 60 min  . Stress: To some extent  Relationships  . Social connections:    Talks on phone: More than three times a week    Gets together: Twice a week    Attends religious service: Never    Active member of club or organization: No    Attends meetings of clubs or organizations: Never    Relationship status: Divorced  . Intimate partner violence:    Fear of current or ex  partner: No    Emotionally abused: No    Physically abused: No    Forced sexual activity: No  Other Topics Concern  . Not on file  Social History Narrative   Married on March 19th, 2017, marriage was never consummated and she left on June 25 th, 2017   Divorced Summer 2018   Still work as a Nutritional therapist for [employer]   He has a Production assistant, radio in Energy [employer] and two masters ( Conservator, museum/gallery and criminal justice administration)      Current Outpatient Medications:  .  diphenhydrAMINE (BENADRYL ALLERGY) 25 MG tablet, Take 1 tablet (25 mg total) by mouth at bedtime as needed., Disp: 30 tablet, Rfl: 0 .  FLUoxetine (PROZAC) 20 MG tablet, Take 20 mg by mouth daily., Disp: , Rfl:  .  montelukast (SINGULAIR) 10 MG tablet, Take 1 tablet (10 mg total) by mouth at bedtime., Disp: 30 tablet, Rfl: 5  No Known Allergies   ROS  Constitutional: Negative for fever or weight change.  Respiratory: positive for cough but no  shortness of breath.   Cardiovascular: Negative for chest pain or palpitations.  Gastrointestinal: Negative for abdominal pain, no bowel changes.  Musculoskeletal: Negative for gait problem or joint swelling.  Skin: Negative for rash.  Neurological: Negative for dizziness or headache.  No other specific complaints in a complete review of systems (except as listed in HPI above).  Objective  Vitals:   03/17/18 0840  BP: 132/82  Pulse: (!) 56  Resp: 16  Temp: 98.3 F (36.8 C)  TempSrc: Oral  SpO2: 99%  Weight: 158 lb 11.2 oz (72 kg)  Height: 5\' 5"  (1.651 m)    Body mass index is 26.41 kg/m.  Physical Exam  Constitutional: Patient appears well-developed and well-nourished. Overweight.  No distress.  HEENT: head atraumatic, normocephalic, pupils equal and reactive to light,  neck supple, throat within normal limits Cardiovascular: Normal rate, regular rhythm and normal heart sounds.  No murmur heard. No BLE  edema. Pulmonary/Chest: Effort normal and breath sounds normal. No respiratory distress. Abdominal: Soft.  There is no tenderness. Psychiatric: Patient has a normal mood and affect. behavior is normal. Judgment and thought content normal.  PHQ2/9: Depression screen [facility] 2/9 03/17/2018 11/15/2017 06/24/2017 02/11/2017 01/04/2017  Decreased Interest 0 2 0 0 0  Down, Depressed, Hopeless 0 0 0 0 0  PHQ - 2 Score 0 2 0 0 0  Altered sleeping 1 2 1  - -  Tired, decreased energy 1 2 1  - -  Change in appetite 0 0 0 - -  Feeling bad or failure about yourself  0 0 0 - -  Trouble concentrating 0 2 1 - -  Moving slowly or fidgety/restless 0 0 0 - -  Suicidal thoughts 0 0 0 - -  PHQ-9 Score 2 8 3  - -  Difficult doing work/chores Not difficult at all Not difficult at all Not difficult at all - -    Fall Risk: Fall Risk  03/17/2018 11/15/2017 01/04/2017 01/02/2016 10/01/2015  Falls in the past year? No No No No No     Functional Status Survey: Is the patient deaf or have difficulty hearing?: No Does the patient have difficulty seeing, even when wearing glasses/contacts?: No Does the patient have difficulty concentrating, remembering, or making decisions?: No Does the patient have difficulty walking or climbing stairs?: No Does the patient have difficulty dressing or bathing?: No Does the patient have difficulty doing errands alone such as visiting a doctor's office or shopping?: No    Assessment & Plan   1. Depression, major, recurrent, mild (HCC)  - FLUoxetine (PROZAC) 20 MG tablet; Take 1 tablet (20 mg total) by mouth daily.  Dispense: 30 tablet; Refill: 0  2. Asthma, cough variant  - CBC with Differential/Platelet  3. Obsessive-compulsive disorder, unspecified type   4. Dyslipidemia  - Cardio IQ Adv Lipid and Inflamm Pnl  5. Gastroesophageal reflux disease without esophagitis   6. Hyperglycemia  - Hemoglobin A1c  7. Generalized anxiety disorder  - FLUoxetine (PROZAC) 20 MG  tablet; Take 1 tablet (20 mg total) by mouth daily.  Dispense: 30 tablet; Refill: 0  8. Long-term use of high-risk medication  - COMPLETE METABOLIC PANEL WITH GFR  9. Needs flu shot  refused

## 2018-03-21 LAB — COMPLETE METABOLIC PANEL WITH GFR
AG RATIO: 2 (calc) (ref 1.0–2.5)
ALBUMIN MSPROF: 5 g/dL (ref 3.6–5.1)
ALT: 20 U/L (ref 9–46)
AST: 17 U/L (ref 10–40)
Alkaline phosphatase (APISO): 82 U/L (ref 40–115)
BILIRUBIN TOTAL: 0.9 mg/dL (ref 0.2–1.2)
BUN: 16 mg/dL (ref 7–25)
CHLORIDE: 102 mmol/L (ref 98–110)
CO2: 29 mmol/L (ref 20–32)
Calcium: 10.1 mg/dL (ref 8.6–10.3)
Creat: 1.09 mg/dL (ref 0.60–1.35)
GFR, EST AFRICAN AMERICAN: 102 mL/min/{1.73_m2} (ref >=60)
GFR, EST NON AFRICAN AMERICAN: 88 mL/min/{1.73_m2} (ref >=60)
GLUCOSE: 91 mg/dL (ref 65–99)
Globulin: 2.5 g/dL (calc) (ref 1.9–3.7)
Potassium: 4.5 mmol/L (ref 3.5–5.3)
Sodium: 139 mmol/L (ref 135–146)
TOTAL PROTEIN: 7.5 g/dL (ref 6.1–8.1)

## 2018-03-21 LAB — CBC WITH DIFFERENTIAL/PLATELET
BASOS ABS: 51 {cells}/uL (ref 0–200)
Basophils Relative: 1.1 %
Eosinophils Absolute: 41 cells/uL (ref 15–500)
Eosinophils Relative: 0.9 %
HCT: 49.1 % (ref 38.5–50.0)
HEMOGLOBIN: 16.9 g/dL (ref 13.2–17.1)
LYMPHS ABS: 1118 {cells}/uL (ref 850–3900)
MCH: 29.5 pg (ref 27.0–33.0)
MCHC: 34.4 g/dL (ref 32.0–36.0)
MCV: 85.8 fL (ref 80.0–100.0)
MPV: 10.2 fL (ref 7.5–12.5)
Monocytes Relative: 9.8 %
NEUTROS ABS: 2939 {cells}/uL (ref 1500–7800)
NEUTROS PCT: 63.9 %
Platelets: 276 10*3/uL (ref 140–400)
RBC: 5.72 10*6/uL (ref 4.20–5.80)
RDW: 12.8 % (ref 11.0–15.0)
Total Lymphocyte: 24.3 %
WBC: 4.6 10*3/uL (ref 3.8–10.8)
WBCMIX: 451 {cells}/uL (ref 200–950)

## 2018-03-21 LAB — CARDIO IQ ADV LIPID AND INFLAMM PNL
Apolipoprotein B: 100 mg/dL — ABNORMAL HIGH
CHOL/HDL RATIO: 4.9 calc (ref <5.0)
CHOLESTEROL: 177 mg/dL (ref <200)
HDL: 36 mg/dL — AB (ref >40)
LDL Cholesterol (Calc): 122 mg/dL (calc) — ABNORMAL HIGH (ref <100)
LDL LARGE: 3468 nmol/L (ref 3382–9376)
LDL Medium: 345 nmol/L (ref 122–498)
LDL PARTICLE NUMBER: 1342 nmol/L (ref 732–2035)
LDL Peak Size: 213.8 Angstrom — ABNORMAL LOW (ref >=217)
LDL Small: 332 nmol/L (ref 85–473)
Lipoprotein (a): 31 nmol/L (ref <75)
NON-HDL CHOLESTEROL (CALC): 141 mg/dL — AB (ref <130)
PLAC: 165 nmol/min/mL — ABNORMAL HIGH (ref 70–153)
TRIGLYCERIDES: 93 mg/dL (ref <150)
hs-CRP: 1 mg/L

## 2018-03-21 LAB — HEMOGLOBIN A1C
Hgb A1c MFr Bld: 5.2 % of total Hgb (ref <5.7)
Mean Plasma Glucose: 103 (calc)
eAG (mmol/L): 5.7 (calc)

## 2018-04-12 ENCOUNTER — Other Ambulatory Visit: Payer: Self-pay | Admitting: Family Medicine

## 2018-04-12 DIAGNOSIS — F411 Generalized anxiety disorder: Secondary | ICD-10-CM

## 2018-04-12 DIAGNOSIS — F330 Major depressive disorder, recurrent, mild: Secondary | ICD-10-CM

## 2018-04-12 DIAGNOSIS — J4521 Mild intermittent asthma with (acute) exacerbation: Secondary | ICD-10-CM

## 2018-05-14 ENCOUNTER — Other Ambulatory Visit: Payer: Self-pay | Admitting: Family Medicine

## 2018-05-14 DIAGNOSIS — F411 Generalized anxiety disorder: Secondary | ICD-10-CM

## 2018-05-14 DIAGNOSIS — F330 Major depressive disorder, recurrent, mild: Secondary | ICD-10-CM

## 2018-05-23 ENCOUNTER — Other Ambulatory Visit: Payer: Self-pay

## 2018-06-29 ENCOUNTER — Ambulatory Visit: Payer: Managed Care, Other (non HMO) | Admitting: Nurse Practitioner

## 2018-06-29 ENCOUNTER — Encounter: Payer: Self-pay | Admitting: Nurse Practitioner

## 2018-06-29 VITALS — BP 116/72 | HR 84 | Temp 98.2°F | Resp 16 | Ht 65.0 in | Wt 169.1 lb

## 2018-06-29 DIAGNOSIS — R05 Cough: Secondary | ICD-10-CM | POA: Diagnosis not present

## 2018-06-29 DIAGNOSIS — J358 Other chronic diseases of tonsils and adenoids: Secondary | ICD-10-CM

## 2018-06-29 DIAGNOSIS — J3489 Other specified disorders of nose and nasal sinuses: Secondary | ICD-10-CM | POA: Diagnosis not present

## 2018-06-29 DIAGNOSIS — J029 Acute pharyngitis, unspecified: Secondary | ICD-10-CM | POA: Diagnosis not present

## 2018-06-29 DIAGNOSIS — R059 Cough, unspecified: Secondary | ICD-10-CM

## 2018-06-29 LAB — POCT RAPID STREP A (OFFICE): RAPID STREP A SCREEN: NEGATIVE

## 2018-06-29 MED ORDER — BENZONATATE 100 MG PO CAPS: 100.0000 mg | ORAL_CAPSULE | Freq: Three times a day (TID) | ORAL | 0 refills | Status: DC | PRN

## 2018-06-29 NOTE — Progress Notes (Signed)
Name: Anthony Hale   MRN: [id number]    DOB: 14-Jun-1986   Date:06/29/2018       Progress Note  Subjective  Chief Complaint  Chief Complaint  Patient presents with  . Sore Throat    predominantly left side  . Ear Fullness  . Cough  . Nasal Congestion  . Diarrhea    patient had GI upset on Friday but it has cleared up    HPI  Friday after lunch was having nausea, abdominal cramping and diarrhea that last 2-3 days. States Sunday has some nasal drainage, sore throat, ears are stopped up- progressively worsening. States nasal congestion, getting thick sputum up now; dry cough.  Denies fevers, chills, shortness of breath, chest pain, sinus pressure/pain.   Patient Active Problem List   Diagnosis Date Noted  . Depression, major, recurrent, mild ([place]) 08/28/2015  . Generalized anxiety disorder 08/28/2015  . IBS (irritable bowel syndrome) 04/03/2015  . Fatty tumor 12/31/2014  . Decorative tattoo 12/31/2014  . Asthma, cough variant 12/30/2014  . Insomnia 12/30/2014  . GERD (gastroesophageal reflux disease) 12/30/2014  . Perennial allergic rhinitis with seasonal variation 05/23/2007    Past Medical History:  Diagnosis Date  . Allergy   . Anxiety   . Asthma   . GERD (gastroesophageal reflux disease)   . Insomnia   . Lipoma     Past Surgical History:  Procedure Laterality Date  . WISDOM TOOTH EXTRACTION      Social History   Tobacco Use  . Smoking status: Never Smoker  . Smokeless tobacco: Never Used  Substance Use Topics  . Alcohol use: Yes    Alcohol/week: 0.0 standard drinks    Comment: rarely     Current Outpatient Medications:  .  diphenhydrAMINE (BENADRYL ALLERGY) 25 MG tablet, Take 1 tablet (25 mg total) by mouth at bedtime as needed., Disp: 30 tablet, Rfl: 0 .  Fluoxetine HCl, PMDD, 20 MG TABS, TAKE 1 TABLET BY MOUTH EVERY DAY, Disp: 90 tablet, Rfl: 1 .  montelukast (SINGULAIR) 10 MG tablet, Take 1 tablet (10 mg total) by mouth at bedtime., Disp: 30  tablet, Rfl: 5 .  SYMBICORT 160-4.5 MCG/ACT inhaler, TAKE 2 PUFFS BY MOUTH TWICE A DAY, Disp: 10.2 Inhaler, Rfl: 0  No Known Allergies  ROS   No other specific complaints in a complete review of systems (except as listed in HPI above).  Objective  Vitals:   06/29/18 1256  BP: 116/72  Pulse: 84  Resp: 16  Temp: 98.2 F (36.8 C)  TempSrc: Oral  SpO2: 99%  Weight: 169 lb 1.6 oz (76.7 kg)  Height: 5\' 5"  (1.651 m)    Body mass index is 28.14 kg/m.  Nursing Note and Vital Signs reviewed.  Physical Exam  Constitutional: He is oriented to person, place, and time. He appears well-developed and well-nourished. He does not appear ill.  HENT:  Head: Normocephalic and atraumatic.  Right Ear: Hearing, tympanic membrane, external ear and ear canal normal.  Left Ear: Hearing, tympanic membrane, external ear and ear canal normal.  Nose: Mucosal edema present. Right sinus exhibits no maxillary sinus tenderness and no frontal sinus tenderness. Left sinus exhibits no maxillary sinus tenderness and no frontal sinus tenderness.  Mouth/Throat: Mucous membranes are dry. No uvula swelling. Posterior oropharyngeal edema and posterior oropharyngeal erythema present. No tonsillar abscesses. Tonsils are 2+ on the right. Tonsils are 2+ on the left. Tonsillar exudate.  Eyes: Pupils are equal, round, and reactive to light. Conjunctivae and EOM are  normal. Right eye exhibits no discharge. Left eye exhibits no discharge.  Neck: Normal range of motion.  Cardiovascular: Normal rate and regular rhythm.  Pulmonary/Chest: Effort normal and breath sounds normal.  Abdominal: Soft. There is no tenderness.  Lymphadenopathy:    He has no cervical adenopathy.  Neurological: He is alert and oriented to person, place, and time.  Skin: Skin is warm and dry. No rash noted.  Psychiatric: He has a normal mood and affect. His behavior is normal. Judgment normal.    No results found for this or any previous visit (from  the past 48 hour(s)).  Assessment & Plan  1. Sorethroat Offered magic mouth wast, patient declined discussed OTC treatments  - POCT rapid strep A - Culture, Group A Strep  2. Cough - benzonatate (TESSALON PERLES) 100 MG capsule; Take 1 capsule (100 mg total) by mouth 3 (three) times daily as needed for cough.  Dispense: 30 capsule; Refill: 0  3. Rhinorrhea - discussed OTC treatments   4. Tonsillith Saline gargles   -I sent a rx for tessalon perls if needed for cough. If your symptoms are not improving in the next 3 days, or you have any new or concerning symptoms please call/message and let us know.

## 2018-06-29 NOTE — Patient Instructions (Addendum)
You likely have a viral upper respiratory infection (URI). Antibiotics will not reduce the number of days you are ill or prevent you from getting bacterial rhinosinusitis. A URI can take up to 14 days to resolve, but typically last between 7-11 days. Your body is so smart and strong that it will be fighting this illness off for you but it is important that you drink plenty of fluids, rest. Cover your nose/mouth when you cough or sneeze and wash your hands well and often. Here are some helpful things you can use or pick up over the counter from the pharmacy to help with your symptoms:   For Fever/Pain: Acetaminophen every 6 hours as needed (maximum of 3000mg  a day). If you are still uncomfortable you can add ibuprofen OR naproxen  For coughing: try dextromethorphan for a cough suppressant, and/or a cool mist humidifier, lozenges  For sore throat: saline gargles, honey herbal tea, lozenges, throat spray  To dry out your nose: try an antihistamine like loratadine (non-sedating) or diphenhydramine (sedating) or others To relieve a stuffy nose: try an oral decongestant  Like pseudoephedrine if you are under the age of 33 and do not have high blood pressure, neti pot To make blowing your nose easier: guaifenesin    I sent a rx for tessalon perls if needed for cough. If your symptoms are not improving in the next 3 days, or you have any new or concerning symptoms please call/message and let us know.

## 2018-07-01 LAB — CULTURE, GROUP A STREP
MICRO NUMBER:: 91484977
SPECIMEN QUALITY:: ADEQUATE

## 2018-09-15 ENCOUNTER — Encounter: Payer: Self-pay | Admitting: Family Medicine

## 2018-09-15 ENCOUNTER — Ambulatory Visit: Payer: Managed Care, Other (non HMO) | Admitting: Family Medicine

## 2018-09-15 VITALS — BP 124/82 | HR 60 | Temp 98.3°F | Resp 16 | Ht 65.0 in | Wt 164.5 lb

## 2018-09-15 DIAGNOSIS — E663 Overweight: Secondary | ICD-10-CM

## 2018-09-15 DIAGNOSIS — R059 Cough, unspecified: Secondary | ICD-10-CM

## 2018-09-15 DIAGNOSIS — K219 Gastro-esophageal reflux disease without esophagitis: Secondary | ICD-10-CM

## 2018-09-15 DIAGNOSIS — F429 Obsessive-compulsive disorder, unspecified: Secondary | ICD-10-CM | POA: Diagnosis not present

## 2018-09-15 DIAGNOSIS — R05 Cough: Secondary | ICD-10-CM

## 2018-09-15 DIAGNOSIS — E785 Hyperlipidemia, unspecified: Secondary | ICD-10-CM | POA: Diagnosis not present

## 2018-09-15 DIAGNOSIS — E786 Lipoprotein deficiency: Secondary | ICD-10-CM

## 2018-09-15 DIAGNOSIS — J45991 Cough variant asthma: Secondary | ICD-10-CM

## 2018-09-15 DIAGNOSIS — F331 Major depressive disorder, recurrent, moderate: Secondary | ICD-10-CM | POA: Diagnosis not present

## 2018-09-15 DIAGNOSIS — J4521 Mild intermittent asthma with (acute) exacerbation: Secondary | ICD-10-CM

## 2018-09-15 DIAGNOSIS — F411 Generalized anxiety disorder: Secondary | ICD-10-CM

## 2018-09-15 DIAGNOSIS — R739 Hyperglycemia, unspecified: Secondary | ICD-10-CM

## 2018-09-15 MED ORDER — BUDESONIDE-FORMOTEROL FUMARATE 160-4.5 MCG/ACT IN AERO: 2.0000 | INHALATION_SPRAY | Freq: Two times a day (BID) | RESPIRATORY_TRACT | 2 refills | Status: DC

## 2018-09-15 MED ORDER — FLUOXETINE HCL 90 MG PO CPDR: 90.0000 mg | DELAYED_RELEASE_CAPSULE | ORAL | 2 refills | Status: DC

## 2018-09-15 MED ORDER — BENZONATATE 100 MG PO CAPS: 100.0000 mg | ORAL_CAPSULE | Freq: Three times a day (TID) | ORAL | 0 refills | Status: DC | PRN

## 2018-09-15 MED ORDER — MONTELUKAST SODIUM 10 MG PO TABS: 10.0000 mg | ORAL_TABLET | Freq: Every day | ORAL | 2 refills | Status: DC

## 2018-09-15 NOTE — Progress Notes (Signed)
Name: Anthony Hale   MRN: [id number]    DOB: 22-Apr-1986   Date:09/15/2018       Progress Note  Subjective  Chief Complaint  Chief Complaint  Patient presents with  . Anxiety  . Depression  . Asthma    HPI  OCD: he states that over the past4 yearshe has noticed intrusive thoughts, has to double check doors, making sure the alarm is set before going to bed, he used to also worried that he had hit someone on the road and use to have to drive back to make sure it was not just in his head.  He needs to countto 4-6 times when double checking anything ( like the alarm, door - needs to be an even number)He needs to organized everything in alphabetical order, books on a shelf has to be from large size to smaller size. He organizes his clothes by color. He gets irritated when things are not organized. 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 It has increase the stress level. He gets paranoid that something will be misplaced or goes missing and he will be the one to blame. He has spoken to his boss but they said he said he is there because he is trustworthy. He is trying to go back to see a therapist now.   Depression/Anxiety:he is getting worse, only taking Prozac occasionally and is getting more anxious and also depressed, recently had an episode of chest pain, difficulty breathing, he felt dizzy and felt like he was having a heart attack. He did not have SOB. He states he is very stressed at work, he was working with a person that irritates him.   Dyslipidemia, low HDL: mother suggested NMR. He is not on medication. No family history of sudden death or heart disease before age 55 for males or pre-menopausal for females. He has been eating healthier.   Cough variant asthma: he has a long history of cough variant asthma..He had an URI back in Dec and recovered completely, however  over the past month he initially had URI and is still having dry cough daily now. He has not been using Symbicort or singulair. Explained the importance of going back on medication. We will refill tessalon perrles.    Patient Active Problem List   Diagnosis Date Noted  . Depression, major, recurrent, mild ([place]) 08/28/2015  . Generalized anxiety disorder 08/28/2015  . IBS (irritable bowel syndrome) 04/03/2015  . Fatty tumor 12/31/2014  . Decorative tattoo 12/31/2014  . Asthma, cough variant 12/30/2014  . Insomnia 12/30/2014  . GERD (gastroesophageal reflux disease) 12/30/2014  . Perennial allergic rhinitis with seasonal variation 05/23/2007    Past Surgical History:  Procedure Laterality Date  . WISDOM TOOTH EXTRACTION      Family History  Problem Relation Age of Onset  . Cancer Mother        Breast  . Hyperlipidemia Mother   . Heart disease Father   . Hyperlipidemia Father   . Hypertension Father     Social History   Socioeconomic History  . Marital status: Legally Separated    Spouse name: Not on file  . Number of children: 0  . Years of education: Not on file  . Highest education level: Master's degree (e.g., MA, MS, MEng, MEd, MSW, MBA)  Occupational History  . Occupation: Engineer, structural  Social Needs  . Financial resource strain:  Not hard at all  . Food insecurity:    Worry: Never true    Inability: Never true  . Transportation needs:    Medical: No    Non-medical: No  Tobacco Use  . Smoking status: Never Smoker  . Smokeless tobacco: Never Used  Substance and Sexual Activity  . Alcohol use: Yes    Alcohol/week: 0.0 standard drinks    Comment: rarely  . Drug use: No  . Sexual activity: Not Currently    Partners: Female  Lifestyle  . Physical activity:    Days per week: 3 days    Minutes per session: 60 min  . Stress: To some extent  Relationships  . Social connections:    Talks on phone: More than three times a week    Gets together: Twice a week     Attends religious service: Never    Active member of club or organization: No    Attends meetings of clubs or organizations: Never    Relationship status: Divorced  . Intimate partner violence:    Fear of current or ex partner: No    Emotionally abused: No    Physically abused: No    Forced sexual activity: No  Other Topics Concern  . Not on file  Social History Narrative   Married on March 19th, 2017, marriage was never consummated and she left on June 25 th, 2017   Divorced Summer 2018   Still work as a Nutritional therapist for [employer]   He has a Production assistant, radio in [employer] and two masters ( Conservator, museum/gallery and criminal justice administration)      Current Outpatient Medications:  .  Fluoxetine HCl, PMDD, 20 MG TABS, TAKE 1 TABLET BY MOUTH EVERY DAY, Disp: 90 tablet, Rfl: 1 .  benzonatate (TESSALON PERLES) 100 MG capsule, Take 1 capsule (100 mg total) by mouth 3 (three) times daily as needed for cough. (Patient not taking: Reported on 09/15/2018), Disp: 30 capsule, Rfl: 0 .  diphenhydrAMINE (BENADRYL ALLERGY) 25 MG tablet, Take 1 tablet (25 mg total) by mouth at bedtime as needed. (Patient not taking: Reported on 09/15/2018), Disp: 30 tablet, Rfl: 0 .  montelukast (SINGULAIR) 10 MG tablet, Take 1 tablet (10 mg total) by mouth at bedtime. (Patient not taking: Reported on 09/15/2018), Disp: 30 tablet, Rfl: 5 .  SYMBICORT 160-4.5 MCG/ACT inhaler, TAKE 2 PUFFS BY MOUTH TWICE A DAY (Patient not taking: Reported on 09/15/2018), Disp: 10.2 Inhaler, Rfl: 0  No Known Allergies  I personally reviewed active problem list, medication list, allergies, family history, social history with the patient/caregiver today.   ROS  Constitutional: Negative for fever or significant  weight change.  Respiratory: Positive  for cough but no  shortness of breath.   Cardiovascular: Negative for chest pain or palpitations.  Gastrointestinal: Negative for abdominal pain, no  bowel changes.  Musculoskeletal: Negative for gait problem or joint swelling.  Skin: Negative for rash.  Neurological: Negative for dizziness or headache.  No other specific complaints in a complete review of systems (except as listed in HPI above).  Objective  Vitals:   09/15/18 0742  BP: 124/82  Pulse: 60  Resp: 16  Temp: 98.3 F (36.8 C)  TempSrc: Oral  SpO2: 99%  Weight: 164 lb 8 oz (74.6 kg)  Height: 5\' 5"  (1.651 m)    Body mass index is 27.37 kg/m.  Physical Exam  Constitutional: Patient appears well-developed and well-nourished. Overweight. No distress.  HEENT: head atraumatic,  normocephalic, pupils equal and reactive to light, ears normal TM, neck supple, throat within normal limits Cardiovascular: Normal rate, regular rhythm and normal heart sounds.  No murmur heard. No BLE edema. Pulmonary/Chest: Effort normal and breath sounds normal. No respiratory distress. Abdominal: Soft.  There is no tenderness. Psychiatric: Patient has a normal mood and affect. behavior is normal. Judgment and thought content normal.  Recent Results (from the past 2160 hour(s))  POCT rapid strep A     Status: Normal   Collection Time: 06/29/18  1:40 PM  Result Value Ref Range   Rapid Strep A Screen Negative Negative  Culture, Group A Strep     Status: None   Collection Time: 06/29/18  2:04 PM  Result Value Ref Range   MICRO NUMBER: [id number]    SPECIMEN QUALITY: Adequate    SOURCE: THROAT    STATUS: FINAL    RESULT: No group A Streptococcus isolated       PHQ2/9: Depression screen [facility] 2/9 09/15/2018 06/29/2018 03/17/2018 11/15/2017 06/24/2017  Decreased Interest 0 0 0 2 0  Down, Depressed, Hopeless 3 0 0 0 0  PHQ - 2 Score 3 0 0 2 0  Altered sleeping 2 3 1 2 1   Tired, decreased energy 2 1 1 2 1   Change in appetite 1 0 0 0 0  Feeling bad or failure about yourself  0 0 0 0 0  Trouble concentrating 2 0 0 2 1  Moving slowly or fidgety/restless 0 0 0 0 0  Suicidal thoughts 0 0 0 0 0   PHQ-9 Score 10 4 2 8 3   Difficult doing work/chores Somewhat difficult Not difficult at all Not difficult at all Not difficult at all Not difficult at all  Some recent data might be hidden   Positive Phq9 , on medication  GAD 7 : Generalized Anxiety Score 09/15/2018 06/29/2018 03/17/2018 11/15/2017  Nervous, Anxious, on Edge 1 0 1 0  Control/stop worrying 3 1 1  0  Worry too much - different things 3 3 1 2   Trouble relaxing 1 1 0 0  Restless 0 1 0 0  Easily annoyed or irritable 2 1 0 0  Afraid - awful might happen 2 0 0 0  Total GAD 7 Score 12 7 3 2   Anxiety Difficulty - Somewhat difficult Not difficult at all Somewhat difficult    Fall Risk: Fall Risk  09/15/2018 06/29/2018 03/17/2018 11/15/2017 01/04/2017  Falls in the past year? 0 0 No No No  Number falls in past yr: 0 0 - - -  Injury with Fall? 0 0 - - -     Assessment & Plan  1. Obsessive-compulsive disorder, unspecified type  - FLUoxetine (PROZAC WEEKLY) 90 MG DR capsule; Take 1 capsule (90 mg total) by mouth every 7 (seven) days.  Dispense: 4 capsule; Refill: 2  2. Dyslipidemia  With low HDL   3. Depression, major, recurrent, moderate (HCC)  - FLUoxetine (PROZAC WEEKLY) 90 MG DR capsule; Take 1 capsule (90 mg total) by mouth every 7 (seven) days.  Dispense: 4 capsule; Refill: 2  4. Gastroesophageal reflux disease without esophagitis  Stable   5. Hyperglycemia  He is avoiding sweets and sodas   6. Low HDL (under 40)  Discussed ways to increase HDL again   7. Asthma, cough variant  - montelukast (SINGULAIR) 10 MG tablet; Take 1 tablet (10 mg total) by mouth at bedtime.  Dispense: 30 tablet; Refill: 2  8. Generalized anxiety disorder  - FLUoxetine (  PROZAC WEEKLY) 90 MG DR capsule; Take 1 capsule (90 mg total) by mouth every 7 (seven) days.  Dispense: 4 capsule; Refill: 2  9. Overweight (BMI 25.0-29.9)   10. Cough  - benzonatate (TESSALON PERLES) 100 MG capsule; Take 1-2 capsules (100-200 mg total) by  mouth 3 (three) times daily as needed for cough.  Dispense: 40 capsule; Refill: 0  11. Mild intermittent asthma with acute exacerbation in adult  - benzonatate (TESSALON PERLES) 100 MG capsule; Take 1-2 capsules (100-200 mg total) by mouth 3 (three) times daily as needed for cough.  Dispense: 40 capsule; Refill: 0 - budesonide-formoterol (SYMBICORT) 160-4.5 MCG/ACT inhaler; Inhale 2 puffs into the lungs 2 (two) times daily.  Dispense: 10.2 Inhaler; Refill: 2

## 2018-09-27 ENCOUNTER — Ambulatory Visit: Payer: 59 | Admitting: Mental Health

## 2018-10-17 ENCOUNTER — Encounter: Payer: Managed Care, Other (non HMO) | Admitting: Family Medicine

## 2018-10-18 ENCOUNTER — Encounter: Payer: Managed Care, Other (non HMO) | Admitting: Family Medicine

## 2018-10-28 ENCOUNTER — Ambulatory Visit: Payer: Managed Care, Other (non HMO) | Admitting: Family Medicine

## 2018-11-23 ENCOUNTER — Other Ambulatory Visit: Payer: Self-pay | Admitting: Family Medicine

## 2018-11-23 DIAGNOSIS — F429 Obsessive-compulsive disorder, unspecified: Secondary | ICD-10-CM

## 2018-11-23 DIAGNOSIS — F331 Major depressive disorder, recurrent, moderate: Secondary | ICD-10-CM

## 2018-11-23 DIAGNOSIS — F411 Generalized anxiety disorder: Secondary | ICD-10-CM

## 2018-12-30 ENCOUNTER — Other Ambulatory Visit: Payer: Self-pay | Admitting: Family Medicine

## 2018-12-30 DIAGNOSIS — F429 Obsessive-compulsive disorder, unspecified: Secondary | ICD-10-CM

## 2018-12-30 DIAGNOSIS — F331 Major depressive disorder, recurrent, moderate: Secondary | ICD-10-CM

## 2018-12-30 DIAGNOSIS — F411 Generalized anxiety disorder: Secondary | ICD-10-CM

## 2019-02-06 ENCOUNTER — Encounter: Payer: Managed Care, Other (non HMO) | Admitting: Family Medicine

## 2019-02-09 ENCOUNTER — Ambulatory Visit: Payer: Managed Care, Other (non HMO) | Admitting: Adult Health

## 2019-02-09 ENCOUNTER — Other Ambulatory Visit: Payer: Self-pay

## 2019-02-09 ENCOUNTER — Encounter: Payer: Self-pay | Admitting: Adult Health

## 2019-02-09 VITALS — BP 122/86 | HR 64 | Temp 98.6°F | Resp 18 | Ht 65.0 in | Wt 163.0 lb

## 2019-02-09 DIAGNOSIS — Z008 Encounter for other general examination: Secondary | ICD-10-CM

## 2019-02-09 DIAGNOSIS — Z0189 Encounter for other specified special examinations: Secondary | ICD-10-CM

## 2019-02-09 NOTE — Patient Instructions (Signed)
The Biometric exam is a brief physical with labs including glucose and cholesterol. This does not replace a full physical with a primary care provider, and additional recommended labs. This is an acute care clinic not for maintenance of chronic or long standing conditions.   Provider also recommends if you do not have a primary care provider for patient to establish care promptly.You can choose any provider of your choice at any facility of your choice, the below information is  just a resource to aid in you finding a primary care provider for routine health maintenance.   [facility]  PHYSICIAN/PROVIDER  REFERRAL LINE at [phone]  [email or web address] to help assist with finding a primary care doctor.   Helpful resources below of other primary care office's accepting new patients.   [facility] Prows         [address]  [place]. [place] [address] [phone]  . [facility]    [address] [place], [place] [address] [phone]  . [facility] [address] [place], [place]  [phone]   . [place] [facility]  [address], [address] [place]  [address] [phone]    Follow up with primary care as needed for chronic and maintenance health care- can be seen in this employee clinic for acute care.    Health Maintenance, Male Adopting a healthy lifestyle and getting preventive care are important in promoting health and wellness. Ask your health care provider about:  The right schedule for you to have regular tests and exams.  Things you can do on your own to prevent diseases and keep yourself healthy. What should I know about diet, weight, and exercise? Eat a healthy diet   Eat a diet that includes plenty of vegetables, fruits, low-fat dairy products, and lean protein.  Do not eat a lot of foods that are high in solid fats, added sugars, or sodium.  Maintain a healthy weight Body mass index (BMI) is a measurement that can be used to identify possible weight problems. It estimates body fat based on height and weight. Your health care provider can help determine your BMI and help you achieve or maintain a healthy weight. Get regular exercise Get regular exercise. This is one of the most important things you can do for your health. Most adults should:  Exercise for at least 150 minutes each week. The exercise should increase your heart rate and make you sweat (moderate-intensity exercise).  Do strengthening exercises at least twice a week. This is in addition to the moderate-intensity exercise.  Spend less time sitting. Even light physical activity can be beneficial. Watch cholesterol and blood lipids Have your blood tested for lipids and cholesterol at 35 years of age, then have this test every 5 years. You may need to have your cholesterol levels checked more often if:  Your lipid or cholesterol levels are high.  You are older than 34 years of age.  You are at high risk for heart disease. What should I know about cancer screening? Many types of cancers can be detected early and may often be prevented. Depending on your health history and family history, you may need to have cancer screening at various ages. This may include screening for:  Colorectal cancer.  Prostate cancer.  Skin cancer.  Lung cancer. What should I know about heart disease, diabetes, and high blood pressure? Blood pressure and heart disease  High blood pressure causes heart disease and increases the risk of stroke. This is more likely to develop in people who have high blood pressure readings, are of African descent, or are overweight.  Talk with your health care provider about your target blood pressure readings.  Have your blood pressure checked: ? Every 3-5 years if you are 7-64 years of age. ? Every year if you are 8 years old or older.  If you  are between the ages of 28 and 25 and are a current or former smoker, ask your health care provider if you should have a one-time screening for abdominal aortic aneurysm (AAA). Diabetes Have regular diabetes screenings. This checks your fasting blood sugar level. Have the screening done:  Once every three years after age 22 if you are at a normal weight and have a low risk for diabetes.  More often and at a younger age if you are overweight or have a high risk for diabetes. What should I know about preventing infection? Hepatitis B If you have a higher risk for hepatitis B, you should be screened for this virus. Talk with your health care provider to find out if you are at risk for hepatitis B infection. Hepatitis C Blood testing is recommended for:  Everyone born from [birth year] through 1967.  Anyone with known risk factors for hepatitis C. Sexually transmitted infections (STIs)  You should be screened each year for STIs, including gonorrhea and chlamydia, if: ? You are sexually active and are younger than 35 years of age. ? You are older than 35 years of age and your health care provider tells you that you are at risk for this type of infection. ? Your sexual activity has changed since you were last screened, and you are at increased risk for chlamydia or gonorrhea. Ask your health care provider if you are at risk.  Ask your health care provider about whether you are at high risk for HIV. Your health care provider may recommend a prescription medicine to help prevent HIV infection. If you choose to take medicine to prevent HIV, you should first get tested for HIV. You should then be tested every 3 months for as long as you are taking the medicine. Follow these instructions at home: Lifestyle  Do not use any products that contain nicotine or tobacco, such as cigarettes, e-cigarettes, and chewing tobacco. If you need help quitting, ask your health care provider.  Do not use street drugs.  Do  not share needles.  Ask your health care provider for help if you need support or information about quitting drugs. Alcohol use  Do not drink alcohol if your health care provider tells you not to drink.  If you drink alcohol: ? Limit how much you have to 0-2 drinks a day. ? Be aware of how much alcohol is in your drink. In the [place] one drink equals one 12 oz bottle of beer (355 mL), one 5 oz glass of wine (148 mL), or one 1 oz glass of hard liquor (44 mL). General instructions  Schedule regular health, dental, and eye exams.  Stay current with your vaccines.  Tell your health care provider if: ? You often feel depressed. ? You have ever been abused or do not feel safe at home. Summary  Adopting a healthy lifestyle and getting preventive care are important in promoting health and wellness.  Follow your health care provider's instructions about healthy diet, exercising, and getting tested or screened for diseases.  Follow  your health care provider's instructions on monitoring your cholesterol and blood pressure. This information is not intended to replace advice given to you by your health care provider. Make sure you discuss any questions you have with your health care provider. Document Released: 01/02/2008 Document Revised: 06/29/2018 Document Reviewed: 06/29/2018 Elsevier Patient Education  2020 [employer].

## 2019-02-09 NOTE — Progress Notes (Signed)
Anthony Hale DOB: 33 y.o. MRN: [id number]  Subjective:  Here for Biometric Screen/brief exam Patient is a 33 year old male in no acute distress who comes to the clinic for his biometric. He denies any health concerns at this visit.   Patient  denies any fever, body aches,chills, rash, chest pain, shortness of breath, nausea, vomiting, or diarrhea.   Steele Sizer, MD is primary care provider.   Patient Active Problem List   Diagnosis Date Noted  . Depression, major, recurrent, mild ([place]) 08/28/2015  . Generalized anxiety disorder 08/28/2015  . IBS (irritable bowel syndrome) 04/03/2015  . Fatty tumor 12/31/2014  . Decorative tattoo 12/31/2014  . Asthma, cough variant 12/30/2014  . Insomnia 12/30/2014  . GERD (gastroesophageal reflux disease) 12/30/2014  . Perennial allergic rhinitis with seasonal variation 05/23/2007      Objective: Blood pressure 122/86, pulse 64, temperature 98.6 F (37 C), temperature source Oral, resp. rate 18, height 5\' 5"  (1.651 m), weight 163 lb (73.9 kg), SpO2 99 %. NAD Patient is alert and oriented and responsive to questions Engages in eye contact with provider. Speaks in full sentences without any pauses without any shortness of breath or distress.  HEENT: Within normal limits Neck: Normal, supple, no adenopathy, thyroid normal  Heart: Regular rate and rhythm Lungs: Clear to auscultation adventitious lung sounds  Patient moves on and off of exam table and in room without difficulty. Gait is normal in hall and in room.  Assessment:  Biometric screen   Plan: He has a primary care provider he follows up with  Fasting glucose and lipids. Discussed with patient that today's visit here is a limited biometric screening visit (not a comprehensive exam or management of any chronic problems) Discussed some health issues, including healthy eating habits and exercise. Encouraged to follow-up with  PCP for annual comprehensive preventive and wellness care (and if applicable, any chronic issues). Questions invited and answered.   Follow up with primary care as needed for chronic and maintenance health care- can be seen in this employee clinic for acute care.

## 2019-02-10 ENCOUNTER — Encounter: Payer: Self-pay | Admitting: Adult Health

## 2019-02-10 LAB — LIPID PANEL WITH LDL/HDL RATIO
Cholesterol, Total: 196 mg/dL (ref 100–199)
HDL: 35 mg/dL — ABNORMAL LOW (ref >39)
LDL Calculated: 124 mg/dL — ABNORMAL HIGH (ref 0–99)
LDl/HDL Ratio: 3.5 ratio (ref 0.0–3.6)
Triglycerides: 184 mg/dL — ABNORMAL HIGH (ref 0–149)
VLDL Cholesterol Cal: 37 mg/dL (ref 5–40)

## 2019-02-10 LAB — GLUCOSE, RANDOM: Glucose: 93 mg/dL (ref 65–99)

## 2019-03-08 ENCOUNTER — Ambulatory Visit: Payer: Managed Care, Other (non HMO) | Admitting: Family Medicine

## 2019-03-08 ENCOUNTER — Other Ambulatory Visit: Payer: Self-pay

## 2019-03-08 ENCOUNTER — Encounter: Payer: Self-pay | Admitting: Family Medicine

## 2019-03-08 VITALS — BP 108/70 | HR 62 | Temp 97.5°F | Resp 16 | Ht 65.75 in | Wt 169.7 lb

## 2019-03-08 DIAGNOSIS — R739 Hyperglycemia, unspecified: Secondary | ICD-10-CM

## 2019-03-08 DIAGNOSIS — Z23 Encounter for immunization: Secondary | ICD-10-CM | POA: Diagnosis not present

## 2019-03-08 DIAGNOSIS — Z114 Encounter for screening for human immunodeficiency virus [HIV]: Secondary | ICD-10-CM

## 2019-03-08 DIAGNOSIS — F331 Major depressive disorder, recurrent, moderate: Secondary | ICD-10-CM | POA: Diagnosis not present

## 2019-03-08 DIAGNOSIS — E785 Hyperlipidemia, unspecified: Secondary | ICD-10-CM

## 2019-03-08 DIAGNOSIS — F429 Obsessive-compulsive disorder, unspecified: Secondary | ICD-10-CM | POA: Diagnosis not present

## 2019-03-08 DIAGNOSIS — Z0000 Encounter for general adult medical examination without abnormal findings: Secondary | ICD-10-CM

## 2019-03-08 DIAGNOSIS — F411 Generalized anxiety disorder: Secondary | ICD-10-CM | POA: Diagnosis not present

## 2019-03-08 DIAGNOSIS — J45991 Cough variant asthma: Secondary | ICD-10-CM

## 2019-03-08 MED ORDER — ALBUTEROL SULFATE HFA 108 (90 BASE) MCG/ACT IN AERS: 2.0000 | INHALATION_SPRAY | Freq: Four times a day (QID) | RESPIRATORY_TRACT | 0 refills | Status: DC | PRN

## 2019-03-08 MED ORDER — FLUOXETINE HCL 90 MG PO CPDR: 90.0000 mg | DELAYED_RELEASE_CAPSULE | ORAL | 1 refills | Status: DC
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
## 2019-03-08 NOTE — Patient Instructions (Signed)
Preventive Care 19-35 Years Old, Male Preventive care refers to lifestyle choices and visits with your health care provider that can promote health and wellness. This includes:  A yearly physical exam. This is also called an annual well check.  Regular dental and eye exams.  Immunizations.  Screening for certain conditions.  Healthy lifestyle choices, such as eating a healthy diet, getting regular exercise, not using drugs or products that contain nicotine and tobacco, and limiting alcohol use. What can I expect for my preventive care visit? Physical exam Your health care provider will check:  Height and weight. These may be used to calculate body mass index (BMI), which is a measurement that tells if you are at a healthy weight.  Heart rate and blood pressure.  Your skin for abnormal spots. Counseling Your health care provider may ask you questions about:  Alcohol, tobacco, and drug use.  Emotional well-being.  Home and relationship well-being.  Sexual activity.  Eating habits.  Work and work Statistician. What immunizations do I need?  Influenza (flu) vaccine  This is recommended every year. Tetanus, diphtheria, and pertussis (Tdap) vaccine  You may need a Td booster every 10 years. Varicella (chickenpox) vaccine  You may need this vaccine if you have not already been vaccinated. Human papillomavirus (HPV) vaccine  If recommended by your health care provider, you may need three doses over 6 months. Measles, mumps, and rubella (MMR) vaccine  You may need at least one dose of MMR. You may also need a second dose. Meningococcal conjugate (MenACWY) vaccine  One dose is recommended if you are 45-76 years old and a Market researcher living in a residence hall, or if you have one of several medical conditions. You may also need additional booster doses. Pneumococcal conjugate (PCV13) vaccine  You may need this if you have certain conditions and were not  previously vaccinated. Pneumococcal polysaccharide (PPSV23) vaccine  You may need one or two doses if you smoke cigarettes or if you have certain conditions. Hepatitis A vaccine  You may need this if you have certain conditions or if you travel or work in places where you may be exposed to hepatitis A. Hepatitis B vaccine  You may need this if you have certain conditions or if you travel or work in places where you may be exposed to hepatitis B. Haemophilus influenzae type b (Hib) vaccine  You may need this if you have certain risk factors. You may receive vaccines as individual doses or as more than one vaccine together in one shot (combination vaccines). Talk with your health care provider about the risks and benefits of combination vaccines. What tests do I need? Blood tests  Lipid and cholesterol levels. These may be checked every 5 years starting at age 17.  Hepatitis C test.  Hepatitis B test. Screening   Diabetes screening. This is done by checking your blood sugar (glucose) after you have not eaten for a while (fasting).  Sexually transmitted disease (STD) testing. Talk with your health care provider about your test results, treatment options, and if necessary, the need for more tests. Follow these instructions at home: Eating and drinking   Eat a diet that includes fresh fruits and vegetables, whole grains, lean protein, and low-fat dairy products.  Take vitamin and mineral supplements as recommended by your health care provider.  Do not drink alcohol if your health care provider tells you not to drink.  If you drink alcohol: ? Limit how much you have to 0-2  drinks a day. ? Be aware of how much alcohol is in your drink. In the [place] one drink equals one 12 oz bottle of beer (355 mL), one 5 oz glass of wine (148 mL), or one 1 oz glass of hard liquor (44 mL). Lifestyle  Take daily care of your teeth and gums.  Stay active. Exercise for at least 30 minutes on 5 or  more days each week.  Do not use any products that contain nicotine or tobacco, such as cigarettes, e-cigarettes, and chewing tobacco. If you need help quitting, ask your health care provider.  If you are sexually active, practice safe sex. Use a condom or other form of protection to prevent STIs (sexually transmitted infections). What's next?  Go to your health care provider once a year for a well check visit.  Ask your health care provider how often you should have your eyes and teeth checked.  Stay up to date on all vaccines. This information is not intended to replace advice given to you by your health care provider. Make sure you discuss any questions you have with your health care provider. Document Released: 09/01/2001 Document Revised: 06/30/2018 Document Reviewed: 06/30/2018 Elsevier Patient Education  2020 Elsevier Inc.  

## 2019-03-09 LAB — COMPLETE METABOLIC PANEL WITH GFR
AG Ratio: 2.1 (calc) (ref 1.0–2.5)
ALT: 19 U/L (ref 9–46)
AST: 13 U/L (ref 10–40)
Albumin: 4.7 g/dL (ref 3.6–5.1)
Alkaline phosphatase (APISO): 59 U/L (ref 36–130)
BUN: 14 mg/dL (ref 7–25)
CO2: 29 mmol/L (ref 20–32)
Calcium: 9.4 mg/dL (ref 8.6–10.3)
Chloride: 102 mmol/L (ref 98–110)
Creat: 1.01 mg/dL (ref 0.60–1.35)
GFR, Est African American: 111 mL/min/{1.73_m2} (ref >=60)
GFR, Est Non African American: 96 mL/min/{1.73_m2} (ref >=60)
Globulin: 2.2 g/dL (calc) (ref 1.9–3.7)
Glucose, Bld: 90 mg/dL (ref 65–99)
Potassium: 4.1 mmol/L (ref 3.5–5.3)
Sodium: 138 mmol/L (ref 135–146)
Total Bilirubin: 0.6 mg/dL (ref 0.2–1.2)
Total Protein: 6.9 g/dL (ref 6.1–8.1)

## 2019-03-09 LAB — CBC WITH DIFFERENTIAL/PLATELET
Absolute Monocytes: 559 cells/uL (ref 200–950)
Basophils Absolute: 29 cells/uL (ref 0–200)
Basophils Relative: 0.5 %
Eosinophils Absolute: 120 cells/uL (ref 15–500)
Eosinophils Relative: 2.1 %
HCT: 46.9 % (ref 38.5–50.0)
Hemoglobin: 16 g/dL (ref 13.2–17.1)
Lymphs Abs: 1271 cells/uL (ref 850–3900)
MCH: 30.8 pg (ref 27.0–33.0)
MCHC: 34.1 g/dL (ref 32.0–36.0)
MCV: 90.2 fL (ref 80.0–100.0)
MPV: 10.3 fL (ref 7.5–12.5)
Monocytes Relative: 9.8 %
Neutro Abs: 3722 cells/uL (ref 1500–7800)
Neutrophils Relative %: 65.3 %
Platelets: 238 10*3/uL (ref 140–400)
RBC: 5.2 10*6/uL (ref 4.20–5.80)
RDW: 12.5 % (ref 11.0–15.0)
Total Lymphocyte: 22.3 %
WBC: 5.7 10*3/uL (ref 3.8–10.8)

## 2019-03-09 LAB — HEMOGLOBIN A1C
Hgb A1c MFr Bld: 5.2 % of total Hgb (ref <5.7)
Mean Plasma Glucose: 103 (calc)
eAG (mmol/L): 5.7 (calc)

## 2019-03-09 LAB — HIV ANTIBODY (ROUTINE TESTING W REFLEX): HIV 1&2 Ab, 4th Generation: NONREACTIVE

## 2019-04-21 ENCOUNTER — Other Ambulatory Visit: Payer: Self-pay | Admitting: Family Medicine

## 2019-04-21 DIAGNOSIS — F331 Major depressive disorder, recurrent, moderate: Secondary | ICD-10-CM

## 2019-04-21 DIAGNOSIS — F429 Obsessive-compulsive disorder, unspecified: Secondary | ICD-10-CM

## 2019-04-21 DIAGNOSIS — F411 Generalized anxiety disorder: Secondary | ICD-10-CM

## 2019-04-21 NOTE — Telephone Encounter (Signed)
Requested medication (s) are due for refill today: yes  Requested medication (s) are on the active medication list: yes  Last refill:  04/01/2019  Future visit scheduled: yes  Notes to clinic: request 90 day supply   Requested Prescriptions  Pending Prescriptions Disp Refills   FLUoxetine (PROZAC WEEKLY) 90 MG DR capsule [Pharmacy Med Name: FLUOXETINE DR 90 MG CAPSULE] 12 capsule 2    Sig: Take 1 capsule (90 mg total) by mouth every 7 (seven) days.     Psychiatry:  Antidepressants - SSRI Passed - 04/21/2019 10:32 AM      Passed - Valid encounter within last 6 months    Recent Outpatient Visits          1 month ago Well adult exam   [facility] Steele Sizer, MD   7 months ago Obsessive-compulsive disorder, unspecified type   [facility] Steele Sizer, MD   9 months ago [place], NP   1 year ago Depression, major, recurrent, mild [facility] [place])   [facility] Steele Sizer, MD   1 year ago Depression, major, recurrent, mild [facility])   [facility] Steele Sizer, MD      Future Appointments            In 4 months Steele Sizer, MD [place] - Completed PHQ-2 or PHQ-9 in the last 360 days.

## 2019-08-17 ENCOUNTER — Encounter: Payer: Self-pay | Admitting: Family Medicine

## 2019-08-17 ENCOUNTER — Ambulatory Visit: Payer: Managed Care, Other (non HMO) | Admitting: Family Medicine

## 2019-08-17 ENCOUNTER — Other Ambulatory Visit: Payer: Self-pay

## 2019-08-17 VITALS — BP 116/80 | HR 62 | Temp 96.9°F | Resp 16 | Ht 65.75 in | Wt 171.2 lb

## 2019-08-17 DIAGNOSIS — R202 Paresthesia of skin: Secondary | ICD-10-CM

## 2019-08-17 DIAGNOSIS — M25522 Pain in left elbow: Secondary | ICD-10-CM

## 2019-08-17 MED ORDER — MELOXICAM 15 MG PO TABS: 15.0000 mg | ORAL_TABLET | Freq: Every day | ORAL | 0 refills | Status: DC

## 2019-08-17 NOTE — Progress Notes (Signed)
Name: Anthony Hale   MRN: [id number]    DOB: [birth year]   Date:08/17/2019       Progress Note  Subjective  Chief Complaint  Chief Complaint  Patient presents with  . Elbow Injury    When working out he feels like his left elbow feels like it is grinding and shooting pain-will make pinky and ring finger feel funny. Start after Christmas when he was lifting weights, unable to tolerate lifting anything even a weight blanket will bother him pulling it up.    HPI  Left elbow pain: he states he started working out, lifting weight before Christmas. He states he could feel a grinding sensation when doing hammer curls and skull crushers, also with bench press. He stopped activity once he noticed increase in grinding and popping on left elbow, however symptoms has persisted and this week he has noticed tingling on left 4th and 5th fingers and decided to come in to be evaluated. No weakness.   Patient Active Problem List   Diagnosis Date Noted  . Depression, major, recurrent, mild ([place]) 08/28/2015  . Generalized anxiety disorder 08/28/2015  . IBS (irritable bowel syndrome) 04/03/2015  . Fatty tumor 12/31/2014  . Decorative tattoo 12/31/2014  . Asthma, cough variant 12/30/2014  . Insomnia 12/30/2014  . GERD (gastroesophageal reflux disease) 12/30/2014  . Perennial allergic rhinitis with seasonal variation 05/23/2007    Past Surgical History:  Procedure Laterality Date  . WISDOM TOOTH EXTRACTION      Family History  Problem Relation Age of Onset  . Cancer Mother        Breast  . Hyperlipidemia Mother   . Heart disease Father   . Hyperlipidemia Father   . Hypertension Father      Current Outpatient Medications:  .  albuterol (VENTOLIN HFA) 108 (90 Base) MCG/ACT inhaler, Inhale 2 puffs into the lungs every 6 (six) hours as needed for wheezing or shortness of breath., Disp: 18 g, Rfl: 0 .  FLUoxetine (PROZAC WEEKLY) 90 MG DR capsule, TAKE 1 CAPSULE (90 MG TOTAL) BY MOUTH EVERY 7  (SEVEN) DAYS., Disp: 12 capsule, Rfl: 1  No Known Allergies  I personally reviewed active problem list, medication list, allergies, family history, social history with the patient/caregiver today.   ROS  Ten systems reviewed and is negative except as mentioned in HPI   Objective  Vitals:   08/17/19 1105  BP: 116/80  Pulse: 62  Resp: 16  Temp: (!) 96.9 F (36.1 C)  TempSrc: Temporal  SpO2: 98%  Weight: 171 lb 3.2 oz (77.7 kg)  Height: 5' 5.75" (1.67 m)    Body mass index is 27.84 kg/m.  Physical Exam  Constitutional: Patient appears well-developed and well-nourished. Overweight.  No distress.  HEENT: head atraumatic, normocephalic, pupils equal and reactive to light,  neck supple, throat within normal limits Cardiovascular: Normal rate, regular rhythm and normal heart sounds.  No murmur heard. No BLE edema. Pulmonary/Chest: Effort normal and breath sounds normal. No respiratory distress. Abdominal: Soft.  There is no tenderness. Muscular Skeletal: pain during palpation of cubital groove left elbow, no weakness, mild swelling on left elbow, no redness Psychiatric: Patient has a normal mood and affect. behavior is normal. Judgment and thought content normal.  PHQ2/9: Depression screen [facility] 2/9 08/17/2019 03/08/2019 03/08/2019 09/15/2018 06/29/2018  Decreased Interest 0 0 0 0 0  Down, Depressed, Hopeless 0 0 0 3 0  PHQ - 2 Score 0 0 0 3 0  Altered sleeping 0 1 0  2 3  Tired, decreased energy 0 1 0 2 1  Change in appetite 0 0 0 1 0  Feeling bad or failure about yourself  0 0 0 0 0  Trouble concentrating 0 0 0 2 0  Moving slowly or fidgety/restless 0 0 0 0 0  Suicidal thoughts 0 0 0 0 0  PHQ-9 Score 0 2 0 10 4  Difficult doing work/chores Not difficult at all Not difficult at all - Somewhat difficult Not difficult at all  Some recent data might be hidden    phq 9 is negative   Fall Risk: Fall Risk  08/17/2019 03/08/2019 09/15/2018 06/29/2018 03/17/2018  Falls in the past  year? 0 0 0 0 No  Number falls in past yr: 0 0 0 0 -  Injury with Fall? 0 0 0 0 -    Functional Status Survey: Is the patient deaf or have difficulty hearing?: No Does the patient have difficulty seeing, even when wearing glasses/contacts?: No Does the patient have difficulty concentrating, remembering, or making decisions?: No Does the patient have difficulty walking or climbing stairs?: No Does the patient have difficulty dressing or bathing?: No Does the patient have difficulty doing errands alone such as visiting a doctor's office or shopping?: No    Assessment & Plan  1. Elbow pain, left  - meloxicam (MOBIC) 15 MG tablet; Take 1 tablet (15 mg total) by mouth daily.  Dispense: 30 tablet; Refill: 0 - Ambulatory referral to Orthopedic Surgery  2. Paresthesia of left arm  - meloxicam (MOBIC) 15 MG tablet; Take 1 tablet (15 mg total) by mouth daily.  Dispense: 30 tablet; Refill: 0 - Ambulatory referral to Orthopedic Surgery

## 2019-08-17 NOTE — Patient Instructions (Signed)
Cubital Tunnel Syndrome  Cubital tunnel syndrome is a condition that causes pain and weakness of the forearm and hand. It happens when one of the nerves that runs along the inside of the elbow joint (ulnar nerve) becomes irritated. This condition is usually caused by repeated arm motions that are done during sports or work-related activities. What are the causes? This condition may be caused by:  Increased pressure on the ulnar nerve at the elbow, arm, or forearm. This can result from: ? Irritation caused by repeated elbow bending. ? Poorly healed elbow fractures. ? Tumors in the elbow. These are usually noncancerous (benign). ? Scar tissue that develops in the elbow after an injury. ? Bony growths (spurs) near the ulnar nerve.  Stretching of the nerve due to loose elbow ligaments.  Trauma to the nerve at the elbow. What increases the risk? The following factors may make you more likely to develop this condition:  Doing manual labor that requires frequent bending of the elbow.  Playing sports that include repeated or strenuous throwing motions, such as baseball.  Playing contact sports, such as football or lacrosse.  Not warming up properly before activities.  Having diabetes.  Having an underactive thyroid (hypothyroidism). What are the signs or symptoms? Symptoms of this condition include:  Clumsiness or weakness of the hand.  Tenderness of the inner elbow.  Aching or soreness of the inner elbow, forearm, or fingers, especially the little finger or the ring finger.  Increased pain when forcing the elbow to bend.  Reduced control when throwing objects.  Tingling, numbness, or a burning feeling inside the forearm or in part of the hand or fingers, especially the little finger or the ring finger.  Sharp pains that shoot from the elbow down to the wrist and hand.  The inability to grip or pinch hard. How is this diagnosed? This condition is diagnosed based on:  Your  symptoms and medical history. Your health care provider will also ask for details about any injury.  A physical exam. You may also have tests, including:  Electromyogram (EMG). This test measures electrical signals sent by your nerves into the muscles.  Nerve conduction study. This test measures how well electrical signals pass through your nerves.  Imaging tests, such as X-rays, ultrasound, and MRI. These tests check for possible causes of your condition. How is this treated? This condition may be treated by:  Stopping the activities that are causing your symptoms to get worse.  Icing and taking medicines to reduce pain and swelling.  Wearing a splint to prevent your elbow from bending, or wearing an elbow pad where the ulnar nerve is closest to the skin.  Working with a physical therapist in less severe cases. This may help to: ? Decrease your symptoms. ? Improve the strength and range of motion of your elbow, forearm, and hand. If these treatments do not help, surgery may be needed. Follow these instructions at home: If you have a splint:  Wear the splint as told by your health care provider. Remove it only as told by your health care provider.  Loosen the splint if your fingers tingle, become numb, or turn cold and blue.  Keep the splint clean.  If the splint is not waterproof: ? Do not let it get wet. ? Cover it with a watertight covering when you take a bath or shower. Managing pain, stiffness, and swelling   If directed, put ice on the injured area: ? Put ice in a plastic bag. ?   Place a towel between your skin and the bag. ? Leave the ice on for 20 minutes, 2-3 times a day.  Move your fingers often to avoid stiffness and to lessen swelling.  Raise (elevate) the injured area above the level of your heart while you are sitting or lying down. General instructions  Take over-the-counter and prescription medicines only as told by your health care provider.  Do any  exercise or physical therapy as told by your health care provider.  Do not drive or use heavy machinery while taking prescription pain medicine.  If you were given an elbow pad, wear it as told by your health care provider.  Keep all follow-up visits as told by your health care provider. This is important. Contact a health care provider if:  Your symptoms get worse.  Your symptoms do not get better with treatment.  You have new pain.  Your hand on the injured side feels numb or cold. Summary  Cubital tunnel syndrome is a condition that causes pain and weakness of the forearm and hand.  You are more likely to develop this condition if you do work or play sports that involve repeated arm movements.  This condition is often treated by stopping repetitive activities, applying ice, and using anti-inflammatory medicines.  In rare cases, surgery may be needed. This information is not intended to replace advice given to you by your health care provider. Make sure you discuss any questions you have with your health care provider. Document Revised: 11/22/2017 Document Reviewed: 11/22/2017 Elsevier Patient Education  2020 Elsevier Inc.  

## 2019-09-08 ENCOUNTER — Ambulatory Visit: Payer: Managed Care, Other (non HMO) | Admitting: Family Medicine

## 2019-09-09 ENCOUNTER — Other Ambulatory Visit: Payer: Self-pay | Admitting: Family Medicine

## 2019-09-09 DIAGNOSIS — R202 Paresthesia of skin: Secondary | ICD-10-CM

## 2019-09-09 DIAGNOSIS — M25522 Pain in left elbow: Secondary | ICD-10-CM

## 2019-09-13 ENCOUNTER — Ambulatory Visit: Payer: Managed Care, Other (non HMO) | Admitting: Family Medicine

## 2019-10-09 ENCOUNTER — Ambulatory Visit: Payer: Managed Care, Other (non HMO) | Admitting: Nurse Practitioner

## 2019-10-09 ENCOUNTER — Other Ambulatory Visit: Payer: Self-pay

## 2019-10-09 VITALS — BP 130/80 | HR 66 | Temp 97.0°F | Resp 16 | Ht 65.0 in | Wt 170.0 lb

## 2019-10-09 DIAGNOSIS — Z008 Encounter for other general examination: Secondary | ICD-10-CM

## 2019-10-09 NOTE — Progress Notes (Signed)
Physical Activity: Unknown  . Days of Exercise per Week: Not on file  . Minutes of Exercise per Session: 30 min     Subjective:    Patient ID: Anthony Hale, male    DOB: [birth year], 34 y.o.   MRN: [id number] who works for the [employer] with a hx of epicondylitis (left), OCD, depression, asthma, GERD and IBS. He presents to the [facility] for his yearly Biometric physical screening. Employee reports he has no acute problems. He reports being treated for tennis elbow on the left with with a cortisone injection and has been doing well since the injection. He does report that now he is having a similar problem with the right elbow. He feels that the problem resulted from his weight lifting so he has d/c that part of his physical activity.   PCP Dr. Steele Sizer  Meds: Albuterol Inhaler as needed, Mobic as needed, Prozac daily Allergies: seasonal, NKDA  Social Hx: No tobacco or drug use, decorative tattoo, currently living with his grandmother to help take care of her because she is elderly. He reports his diet is not good due to her cooking for him and using a lot of grease.   Vaccines: UPD, has had influenza vaccine and both doses of Covid Vaccine  Review of Systems  Musculoskeletal:       Right elbow discomfort  All other systems reviewed and are negative.      Objective:   Physical Exam Constitutional:      General: He is not in acute distress.    Appearance: Normal appearance. He is normal weight.  HENT:     Head: Normocephalic.     Right Ear: External ear normal.     Left Ear: External ear normal.     Nose: Nose normal.     Mouth/Throat:     Mouth: Mucous membranes are moist.  Eyes:     Extraocular Movements: Extraocular movements intact.     Conjunctiva/sclera: Conjunctivae normal.  Cardiovascular:     Rate and Rhythm: Normal rate and regular rhythm.     Pulses: Normal pulses.     Heart sounds: Normal heart sounds.  Pulmonary:     Effort: Pulmonary  effort is normal.     Breath sounds: Normal breath sounds.  Abdominal:     General: Abdomen is flat. There is no distension.     Tenderness: There is no abdominal tenderness.  Musculoskeletal:        General: Normal range of motion.     Cervical back: Normal range of motion. No rigidity or tenderness.  Lymphadenopathy:     Cervical: No cervical adenopathy.  Skin:    General: Skin is warm and dry.  Neurological:     General: No focal deficit present.     Mental Status: He is alert.     Cranial Nerves: No facial asymmetry.     Motor: Motor function is intact. No weakness.     Coordination: Romberg sign negative.     Deep Tendon Reflexes:     Reflex Scores:      Bicep reflexes are 2+ on the right side and 2+ on the left side.      Brachioradialis reflexes are 2+ on the right side and 2+ on the left side.      Patellar reflexes are 2+ on the right side and 2+ on the left side.    Comments: Stands on one foot without difficulty.  Psychiatric:  Attention and Perception: Attention normal.        Mood and Affect: Mood normal.        Behavior: Behavior normal. Behavior is cooperative.    BP 130/80 (BP Location: Left Arm, Patient Position: Sitting, Cuff Size: Normal)   Pulse 66   Temp (!) 97 F (36.1 C) (Temporal)   Resp 16   Ht 5\' 5"  (1.651 m)   Wt 170 lb (77.1 kg)   SpO2 97%   BMI 28.29 kg/m         Assessment & Plan:  34 y.o. male here for biometric screening exam. Labs drawn. Discussed with the employee importance of balanced diet and exercise. He will f/u with his PCP for his regular health care.

## 2019-10-10 LAB — LIPID PANEL
Chol/HDL Ratio: 6.2 ratio — ABNORMAL HIGH (ref 0.0–5.0)
Cholesterol, Total: 212 mg/dL — ABNORMAL HIGH (ref 100–199)
HDL: 34 mg/dL — ABNORMAL LOW (ref >39)
LDL Chol Calc (NIH): 132 mg/dL — ABNORMAL HIGH (ref 0–99)
Triglycerides: 257 mg/dL — ABNORMAL HIGH (ref 0–149)
VLDL Cholesterol Cal: 46 mg/dL — ABNORMAL HIGH (ref 5–40)

## 2019-10-10 LAB — GLUCOSE, RANDOM: Glucose: 91 mg/dL (ref 65–99)

## 2019-10-16 ENCOUNTER — Encounter: Payer: Self-pay | Admitting: Nurse Practitioner

## 2019-10-23 ENCOUNTER — Ambulatory Visit: Payer: Managed Care, Other (non HMO) | Admitting: Family Medicine

## 2019-11-06 ENCOUNTER — Other Ambulatory Visit: Payer: Self-pay | Admitting: Family Medicine

## 2019-11-06 DIAGNOSIS — F411 Generalized anxiety disorder: Secondary | ICD-10-CM

## 2019-11-06 DIAGNOSIS — F429 Obsessive-compulsive disorder, unspecified: Secondary | ICD-10-CM

## 2019-11-06 DIAGNOSIS — F331 Major depressive disorder, recurrent, moderate: Secondary | ICD-10-CM

## 2020-01-19 ENCOUNTER — Inpatient Hospital Stay: Payer: Managed Care, Other (non HMO) | Admitting: Internal Medicine

## 2020-02-19 ENCOUNTER — Encounter: Payer: Managed Care, Other (non HMO) | Admitting: Family Medicine

## 2020-03-18 DIAGNOSIS — M7712 Lateral epicondylitis, left elbow: Secondary | ICD-10-CM | POA: Insufficient documentation

## 2020-05-03 ENCOUNTER — Other Ambulatory Visit: Payer: Self-pay | Admitting: Family Medicine

## 2020-05-03 DIAGNOSIS — F331 Major depressive disorder, recurrent, moderate: Secondary | ICD-10-CM

## 2020-05-03 DIAGNOSIS — F429 Obsessive-compulsive disorder, unspecified: Secondary | ICD-10-CM

## 2020-05-03 DIAGNOSIS — F411 Generalized anxiety disorder: Secondary | ICD-10-CM

## 2020-05-03 NOTE — Telephone Encounter (Signed)
Requested medications are due for refill today yes  Requested medications are on the active medication list yes  Last refill 9/21  Last visit 02/2019 (valid visit)  Future visit scheduled 05/2020  Notes to clinic Failed protocol of visit within 6 months, does have upcoming appt, please assess

## 2020-05-27 NOTE — Progress Notes (Signed)
Name: Anthony Hale   MRN: [id number]    DOB: 1986/07/01   Date:05/28/2020       Progress Note  Subjective  Chief Complaint  CPE  HPI  Patient presents for annual CPE and follow up  OCD: he states that over the past6yearshe has noticed intrusive thoughts, has to double check doors ,making sure the alarm is set before going to bed multiple times. He is getting worried about hitting someone while driving, sometimes drives back to make sure he has not hit anyone. He needs to countto 4-6 times- it must be an even number -  when double checking anything ( like the alarm, car  door - needs to be an even number)He needs to organized everything in alphabetical order, books on a shelf has to be from large size to smaller size. He organizes his clothes by color. He gets irritated when his own  things are not organized. He states main symptom now is having to back to check he has not hit someone while driving   MDD/GAD:he had a severe episode of depression with weight loss, lack of motivation, difficulty focusing at work, but has been doing well for a while, taking medication weekly, he states depression is under control but still feels anxious, OCD symptoms are increasing again.   Dyslipidemia, low HDL: He is not on medication. No family history of sudden death or heart disease before age 57 for males or pre-menopausal for females. 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 Denies nocturnal cough, wheezing or sob at this time, uses Ventolin prn only . Advised flu shot but he refused    IPSS Questionnaire (AUA-7): Over the past month.   1)  How often have you had a sensation of not emptying your bladder completely after you finish urinating?  0- Not at all  2)  How often have you had to urinate again less than two hours after you finished urinating? 0 - Not at all  3)  How  often have you found you stopped and started again several times when you urinated?  0- Not at all  4) How difficult have you found it to postpone urination?  0- Not at all  5) How often have you had a weak urinary stream?  0- Not at all  6) How often have you had to push or strain to begin urination?  0- Not at all  7) How many times did you most typically get up to urinate from the time you went to bed until the time you got up in the morning?  1- One Time  Total score:  0-7 mildly symptomatic   8-19 moderately symptomatic   20-35 severely symptomatic     Diet: eats on the go multiple days a week. Discussed balanced diet  Exercise: he has not been going to the gym because of left lateral elbow epicondylitis , discussed at least 150 minutes per week  Depression: phq 9 is negative Depression screen [facility] 2/9 05/28/2020 05/28/2020 08/17/2019 03/08/2019 03/08/2019  Decreased Interest 0 0 0 0 0  Down, Depressed, Hopeless 0 0 0 0 0  PHQ - 2 Score 0 0 0 0 0  Altered sleeping 1 - 0 1 0  Tired, decreased energy 0 - 0 1 0  Change in appetite 0 - 0 0 0  Feeling bad or failure about yourself  0 -  0 0 0  Trouble concentrating 0 - 0 0 0  Moving slowly or fidgety/restless 0 - 0 0 0  Suicidal thoughts 0 - 0 0 0  PHQ-9 Score 1 - 0 2 0  Difficult doing work/chores Not difficult at all - Not difficult at all Not difficult at all -  Some recent data might be hidden    Hypertension:  BP Readings from Last 3 Encounters:  05/28/20 132/80  10/09/19 130/80  08/17/19 116/80    Obesity: Wt Readings from Last 3 Encounters:  05/28/20 168 lb 6.4 oz (76.4 kg)  10/09/19 170 lb (77.1 kg)  08/17/19 171 lb 3.2 oz (77.7 kg)   BMI Readings from Last 3 Encounters:  05/28/20 28.02 kg/m  10/09/19 28.29 kg/m  08/17/19 27.84 kg/m     Lipids:  Lab Results  Component Value Date   CHOL 212 (H) 10/09/2019   CHOL 196 02/09/2019   CHOL 177 03/17/2018   Lab Results  Component Value Date   HDL 34 (L)  10/09/2019   HDL 35 (L) 02/09/2019   HDL 36 (L) 03/17/2018   Lab Results  Component Value Date   LDLCALC 132 (H) 10/09/2019   LDLCALC 124 (H) 02/09/2019   LDLCALC 122 (H) 03/17/2018   Lab Results  Component Value Date   TRIG 257 (H) 10/09/2019   TRIG 184 (H) 02/09/2019   TRIG 93 03/17/2018   Lab Results  Component Value Date   CHOLHDL 6.2 (H) 10/09/2019   CHOLHDL 4.9 03/17/2018   CHOLHDL 6.2 (H) 10/11/2017   No results found for: LDLDIRECT Glucose:  Glucose  Date Value Ref Range Status  10/09/2019 91 65 - 99 mg/dL Final  02/09/2019 93 65 - 99 mg/dL Final   Glucose, Bld  Date Value Ref Range Status  03/08/2019 90 65 - 99 mg/dL Final    Comment:    .            Fasting reference interval .   03/17/2018 91 65 - 99 mg/dL Final    Comment:    .            Fasting reference interval .   01/13/2017 99 65 - 99 mg/dL Final      [facility] Visit from 05/28/2020 in [facility]  AUDIT-C Score 1      Legally Separated STD testing and prevention (HIV/chl/gon/syphilis): today except for GC Hep C: 10/17/2015  Skin cancer: Discussed monitoring for atypical lesions Colorectal cancer: N/A Prostate cancer: N/A   ECG:  11/02/2009  Vaccines:   HPV: refused  Flu:  educated and discussed with patient, he refused   Advanced Care Planning: A voluntary discussion about advance care planning including the explanation and discussion of advance directives.  Discussed health care proxy and Living will, and the patient was able to identify a health care proxy as Anthony Hale .  Patient does  have a living will at present time.  Patient Active Problem List   Diagnosis Date Noted  . Lateral epicondylitis, left elbow 03/18/2020  . Encounter for biometric screening 10/09/2019  . Depression, major, recurrent, mild ([place]) 08/28/2015  . Generalized anxiety disorder 08/28/2015  . IBS (irritable bowel syndrome) 04/03/2015  . Fatty tumor 12/31/2014  . Decorative tattoo  12/31/2014  . Asthma, cough variant 12/30/2014  . Insomnia 12/30/2014  . GERD (gastroesophageal reflux disease) 12/30/2014  . Perennial allergic rhinitis with seasonal variation 05/23/2007    Past Surgical History:  Procedure Laterality Date  . WISDOM  TOOTH EXTRACTION      Family History  Problem Relation Age of Onset  . Cancer Mother        Breast  . Hyperlipidemia Mother   . Heart disease Father   . Hyperlipidemia Father   . Hypertension Father     Social History   Socioeconomic History  . Marital status: Divorced    Spouse name: Not on file  . Number of children: 0  . Years of education: Not on file  . Highest education level: Master's degree (e.g., MA, MS, MEng, MEd, MSW, MBA)  Occupational History  . Occupation: Engineer, structural  Tobacco Use  . Smoking status: Never Smoker  . Smokeless tobacco: Never Used  Vaping Use  . Vaping Use: Never used  Substance and Sexual Activity  . Alcohol use: Yes    Alcohol/week: 0.0 standard drinks    Comment: rarely  . Drug use: No  . Sexual activity: Not Currently    Partners: Female  Other Topics Concern  . Not on file  Social History Narrative   Married on March 19th, 2017, marriage was never consummated and she left on June 25 th, 2017   Divorced Summer 2018   Still work as a Nutritional therapist for [employer]   He has a Production assistant, radio in Database administrator ( Conservator, museum/gallery and criminal justice administration)    Social Determinants of [place] Strain: [place]   . Difficulty of Paying Living Expenses: Not hard at all  Food Insecurity: No Food Insecurity  . Worried About Charity fundraiser in the Last Year: Never true  . Ran Out of Food in the Last Year: Never true  Transportation Needs: No Transportation Needs  . Lack of Transportation (Medical): No  . Lack of Transportation (Non-Medical): No  Physical Activity: Insufficiently Active  . Days of Exercise per  Week: 1 day  . Minutes of Exercise per Session: 20 min  Stress: Stress Concern Present  . Feeling of Stress : To some extent  Social Connections: Socially Isolated  . Frequency of Communication with Friends and Family: Twice a week  . Frequency of Social Gatherings with Friends and Family: Three times a week  . Attends Religious Services: Never  . Active Member of Clubs or Organizations: Patient refused  . Attends Archivist Meetings: Never  . Marital Status: Divorced  Human resources officer Violence: Not At Risk  . Fear of Current or Ex-Partner: No  . Emotionally Abused: No  . Physically Abused: No  . Sexually Abused: No     Current Outpatient Medications:  .  albuterol (VENTOLIN HFA) 108 (90 Base) MCG/ACT inhaler, Inhale 2 puffs into the lungs every 6 (six) hours as needed for wheezing or shortness of breath., Disp: 18 g, Rfl: 0 .  celecoxib (CELEBREX) 200 MG capsule, Take by mouth., Disp: , Rfl:  .  FLUoxetine (PROZAC WEEKLY) 90 MG DR capsule, TAKE 1 CAPSULE BY MOUTH EVERY 7 DAYS, Disp: 4 capsule, Rfl: 5  No Known Allergies   ROS  Constitutional: Negative for fever or weight change.  Respiratory: Negative for cough and shortness of breath.   Cardiovascular: Negative for chest pain or palpitations.  Gastrointestinal: Negative for abdominal pain, no bowel changes.  Musculoskeletal: Negative for gait problem or joint swelling.  Skin: Negative for rash.  Neurological: Negative for dizziness or headache.  No other specific complaints in a complete review of systems (except as listed in HPI  above).  Objective  Vitals:   05/28/20 1300  BP: 132/80  Pulse: 72  Resp: 16  SpO2: 100%  Weight: 168 lb 6.4 oz (76.4 kg)  Height: 5\' 5"  (1.651 m)    Body mass index is 28.02 kg/m.  Physical Exam  Constitutional: Patient appears well-developed and well-nourished. No distress.  HENT: Head: Normocephalic and atraumatic. Ears: B TMs ok, no erythema or effusion; Nose: Not done  . Mouth/Throat: not done  Eyes: Conjunctivae and EOM are normal. Pupils are equal, round, and reactive to light. No scleral icterus.  Neck: Normal range of motion. Neck supple. No JVD present. No thyromegaly present.  Cardiovascular: Normal rate, regular rhythm and normal heart sounds.  No murmur heard. No BLE edema. Pulmonary/Chest: Effort normal and breath sounds normal. No respiratory distress. Abdominal: Soft. Bowel sounds are normal, no distension. There is no tenderness. no masses MALE GENITALIA: Normal descended testes bilaterally, no masses palpated, no hernias, no lesions, no discharge RECTAL: not done Musculoskeletal: Normal range of motion, no joint effusions. No gross deformities Neurological: he is alert and oriented to person, place, and time. No cranial nerve deficit. Coordination, balance, strength, speech and gait are normal.  Skin: Skin is warm and dry. No rash noted. No erythema.  Psychiatric: Patient has a normal mood and affect. behavior is normal. Judgment and thought content normal.  Fall Risk: Fall Risk  05/28/2020 08/17/2019 03/08/2019 09/15/2018 06/29/2018  Falls in the past year? 0 0 0 0 0  Number falls in past yr: 0 0 0 0 0  Injury with Fall? 0 0 0 0 0     Functional Status Survey: Is the patient deaf or have difficulty hearing?: No Does the patient have difficulty seeing, even when wearing glasses/contacts?: No Does the patient have difficulty concentrating, remembering, or making decisions?: No Does the patient have difficulty walking or climbing stairs?: No Does the patient have difficulty dressing or bathing?: No Does the patient have difficulty doing errands alone such as visiting a doctor's office or shopping?: No    Assessment & Plan  1. Well adult exam  - Lipid panel - CBC with Differential/Platelet - COMPLETE METABOLIC PANEL WITH GFR - Hemoglobin A1c - HIV Antibody (routine testing w rflx)  2. Hyperglycemia  - Hemoglobin A1c  3. Low HDL  (under 40)  - Lipid panel  4. Gastroesophageal reflux disease without esophagitis   5. Asthma, cough variant  Continue prn ventolin  6. Obsessive-compulsive disorder, unspecified type   7. Long-term use of high-risk medication  - CBC with Differential/Platelet - COMPLETE METABOLIC PANEL WITH GFR  8. Routine screening for STI (sexually transmitted infection)  - HIV Antibody (routine testing w rflx) - Hepatitis C antibody - RPR  9. Generalized anxiety disorder   10. Major depression in remission [facility])   -Prostate cancer screening and PSA options (with potential risks and benefits of testing vs not testing) were discussed along with recent recs/guidelines. -USPSTF grade A and B recommendations reviewed with patient; age-appropriate recommendations, preventive care, screening tests, etc discussed and encouraged; healthy living encouraged; see AVS for patient education given to patient -Discussed importance of 150 minutes of physical activity weekly, eat two servings of fish weekly, eat one serving of tree nuts ( cashews, pistachios, pecans, almonds.Marland Kitchen) every other day, eat 6 servings of fruit/vegetables daily and drink plenty of water and avoid sweet beverages.

## 2020-05-28 ENCOUNTER — Other Ambulatory Visit: Payer: Self-pay

## 2020-05-28 ENCOUNTER — Ambulatory Visit (INDEPENDENT_AMBULATORY_CARE_PROVIDER_SITE_OTHER): Payer: Managed Care, Other (non HMO) | Admitting: Family Medicine

## 2020-05-28 ENCOUNTER — Encounter: Payer: Self-pay | Admitting: Family Medicine

## 2020-05-28 VITALS — BP 132/80 | HR 72 | Resp 16 | Ht 65.0 in | Wt 168.4 lb

## 2020-05-28 DIAGNOSIS — F325 Major depressive disorder, single episode, in full remission: Secondary | ICD-10-CM

## 2020-05-28 DIAGNOSIS — E786 Lipoprotein deficiency: Secondary | ICD-10-CM | POA: Diagnosis not present

## 2020-05-28 DIAGNOSIS — K219 Gastro-esophageal reflux disease without esophagitis: Secondary | ICD-10-CM | POA: Diagnosis not present

## 2020-05-28 DIAGNOSIS — F411 Generalized anxiety disorder: Secondary | ICD-10-CM

## 2020-05-28 DIAGNOSIS — R739 Hyperglycemia, unspecified: Secondary | ICD-10-CM

## 2020-05-28 DIAGNOSIS — Z0000 Encounter for general adult medical examination without abnormal findings: Secondary | ICD-10-CM | POA: Diagnosis not present

## 2020-05-28 DIAGNOSIS — J45991 Cough variant asthma: Secondary | ICD-10-CM | POA: Diagnosis not present

## 2020-05-28 DIAGNOSIS — Z113 Encounter for screening for infections with a predominantly sexual mode of transmission: Secondary | ICD-10-CM

## 2020-05-28 DIAGNOSIS — F429 Obsessive-compulsive disorder, unspecified: Secondary | ICD-10-CM

## 2020-05-28 DIAGNOSIS — Z79899 Other long term (current) drug therapy: Secondary | ICD-10-CM

## 2020-05-28 MED ORDER — ALBUTEROL SULFATE HFA 108 (90 BASE) MCG/ACT IN AERS: 2.0000 | INHALATION_SPRAY | Freq: Four times a day (QID) | RESPIRATORY_TRACT | 0 refills | Status: DC | PRN

## 2020-05-28 NOTE — Patient Instructions (Signed)
Preventive Care 19-36 Years Old, Male Preventive care refers to lifestyle choices and visits with your health care provider that can promote health and wellness. This includes:  A yearly physical exam. This is also called an annual well check.  Regular dental and eye exams.  Immunizations.  Screening for certain conditions.  Healthy lifestyle choices, such as eating a healthy diet, getting regular exercise, not using drugs or products that contain nicotine and tobacco, and limiting alcohol use. What can I expect for my preventive care visit? Physical exam Your health care provider will check:  Height and weight. These may be used to calculate body mass index (BMI), which is a measurement that tells if you are at a healthy weight.  Heart rate and blood pressure.  Your skin for abnormal spots. Counseling Your health care provider may ask you questions about:  Alcohol, tobacco, and drug use.  Emotional well-being.  Home and relationship well-being.  Sexual activity.  Eating habits.  Work and work Statistician. What immunizations do I need?  Influenza (flu) vaccine  This is recommended every year. Tetanus, diphtheria, and pertussis (Tdap) vaccine  You may need a Td booster every 10 years. Varicella (chickenpox) vaccine  You may need this vaccine if you have not already been vaccinated. Human papillomavirus (HPV) vaccine  If recommended by your health care provider, you may need three doses over 6 months. Measles, mumps, and rubella (MMR) vaccine  You may need at least one dose of MMR. You may also need a second dose. Meningococcal conjugate (MenACWY) vaccine  One dose is recommended if you are 45-76 years old and a Market researcher living in a residence hall, or if you have one of several medical conditions. You may also need additional booster doses. Pneumococcal conjugate (PCV13) vaccine  You may need this if you have certain conditions and were not  previously vaccinated. Pneumococcal polysaccharide (PPSV23) vaccine  You may need one or two doses if you smoke cigarettes or if you have certain conditions. Hepatitis A vaccine  You may need this if you have certain conditions or if you travel or work in places where you may be exposed to hepatitis A. Hepatitis B vaccine  You may need this if you have certain conditions or if you travel or work in places where you may be exposed to hepatitis B. Haemophilus influenzae type b (Hib) vaccine  You may need this if you have certain risk factors. You may receive vaccines as individual doses or as more than one vaccine together in one shot (combination vaccines). Talk with your health care provider about the risks and benefits of combination vaccines. What tests do I need? Blood tests  Lipid and cholesterol levels. These may be checked every 5 years starting at age 17.  Hepatitis C test.  Hepatitis B test. Screening   Diabetes screening. This is done by checking your blood sugar (glucose) after you have not eaten for a while (fasting).  Sexually transmitted disease (STD) testing. Talk with your health care provider about your test results, treatment options, and if necessary, the need for more tests. Follow these instructions at home: Eating and drinking   Eat a diet that includes fresh fruits and vegetables, whole grains, lean protein, and low-fat dairy products.  Take vitamin and mineral supplements as recommended by your health care provider.  Do not drink alcohol if your health care provider tells you not to drink.  If you drink alcohol: ? Limit how much you have to 0-2  drinks a day. ? Be aware of how much alcohol is in your drink. In the [place] one drink equals one 12 oz bottle of beer (355 mL), one 5 oz glass of wine (148 mL), or one 1 oz glass of hard liquor (44 mL). Lifestyle  Take daily care of your teeth and gums.  Stay active. Exercise for at least 30 minutes on 5 or  more days each week.  Do not use any products that contain nicotine or tobacco, such as cigarettes, e-cigarettes, and chewing tobacco. If you need help quitting, ask your health care provider.  If you are sexually active, practice safe sex. Use a condom or other form of protection to prevent STIs (sexually transmitted infections). What's next?  Go to your health care provider once a year for a well check visit.  Ask your health care provider how often you should have your eyes and teeth checked.  Stay up to date on all vaccines. This information is not intended to replace advice given to you by your health care provider. Make sure you discuss any questions you have with your health care provider. Document Revised: 06/30/2018 Document Reviewed: 06/30/2018 Elsevier Patient Education  2020 [employer].

## 2020-05-30 LAB — CBC WITH DIFFERENTIAL/PLATELET
Absolute Monocytes: 490 cells/uL (ref 200–950)
Basophils Absolute: 41 cells/uL (ref 0–200)
Basophils Relative: 0.8 %
Eosinophils Absolute: 102 cells/uL (ref 15–500)
Eosinophils Relative: 2 %
HCT: 46 % (ref 38.5–50.0)
Hemoglobin: 15.8 g/dL (ref 13.2–17.1)
Lymphs Abs: 1010 cells/uL (ref 850–3900)
MCH: 30 pg (ref 27.0–33.0)
MCHC: 34.3 g/dL (ref 32.0–36.0)
MCV: 87.3 fL (ref 80.0–100.0)
MPV: 9.7 fL (ref 7.5–12.5)
Monocytes Relative: 9.6 %
Neutro Abs: 3458 cells/uL (ref 1500–7800)
Neutrophils Relative %: 67.8 %
Platelets: 247 10*3/uL (ref 140–400)
RBC: 5.27 10*6/uL (ref 4.20–5.80)
RDW: 12.5 % (ref 11.0–15.0)
Total Lymphocyte: 19.8 %
WBC: 5.1 10*3/uL (ref 3.8–10.8)

## 2020-05-30 LAB — HEMOGLOBIN A1C
Hgb A1c MFr Bld: 5.4 % of total Hgb (ref <5.7)
Mean Plasma Glucose: 108 (calc)
eAG (mmol/L): 6 (calc)

## 2020-05-30 LAB — COMPLETE METABOLIC PANEL WITH GFR
AG Ratio: 2 (calc) (ref 1.0–2.5)
ALT: 44 U/L (ref 9–46)
AST: 22 U/L (ref 10–40)
Albumin: 4.5 g/dL (ref 3.6–5.1)
Alkaline phosphatase (APISO): 69 U/L (ref 36–130)
BUN: 12 mg/dL (ref 7–25)
CO2: 30 mmol/L (ref 20–32)
Calcium: 9.7 mg/dL (ref 8.6–10.3)
Chloride: 105 mmol/L (ref 98–110)
Creat: 1 mg/dL (ref 0.60–1.35)
GFR, Est African American: 112 mL/min/{1.73_m2} (ref >=60)
GFR, Est Non African American: 96 mL/min/{1.73_m2} (ref >=60)
Globulin: 2.2 g/dL (calc) (ref 1.9–3.7)
Glucose, Bld: 100 mg/dL — ABNORMAL HIGH (ref 65–99)
Potassium: 4.4 mmol/L (ref 3.5–5.3)
Sodium: 140 mmol/L (ref 135–146)
Total Bilirubin: 0.6 mg/dL (ref 0.2–1.2)
Total Protein: 6.7 g/dL (ref 6.1–8.1)

## 2020-05-30 LAB — LIPID PANEL
Cholesterol: 213 mg/dL — ABNORMAL HIGH (ref <200)
HDL: 36 mg/dL — ABNORMAL LOW (ref >=40)
LDL Cholesterol (Calc): 151 mg/dL (calc) — ABNORMAL HIGH
Non-HDL Cholesterol (Calc): 177 mg/dL (calc) — ABNORMAL HIGH (ref <130)
Total CHOL/HDL Ratio: 5.9 (calc) — ABNORMAL HIGH (ref <5.0)
Triglycerides: 136 mg/dL (ref <150)

## 2020-05-30 LAB — HEPATITIS C ANTIBODY
Hepatitis C Ab: NONREACTIVE
SIGNAL TO CUT-OFF: 0.01 (ref <1.00)

## 2020-05-30 LAB — HIV ANTIBODY (ROUTINE TESTING W REFLEX): HIV 1&2 Ab, 4th Generation: NONREACTIVE

## 2020-05-30 LAB — RPR: RPR Ser Ql: NONREACTIVE

## 2020-09-02 ENCOUNTER — Ambulatory Visit: Payer: Managed Care, Other (non HMO) | Admitting: Family Medicine

## 2020-09-02 ENCOUNTER — Encounter: Payer: Self-pay | Admitting: Family Medicine

## 2020-09-02 ENCOUNTER — Other Ambulatory Visit: Payer: Self-pay

## 2020-09-02 VITALS — BP 138/78 | HR 100 | Temp 98.1°F | Resp 17 | Ht 65.0 in | Wt 187.0 lb

## 2020-09-02 DIAGNOSIS — R868 Other abnormal findings in specimens from male genital organs: Secondary | ICD-10-CM | POA: Diagnosis not present

## 2020-09-02 NOTE — Progress Notes (Signed)
Name: Anthony Hale   MRN: [id number]    DOB: 10-04-1985   Date:09/02/2020       Progress Note  Subjective  Chief Complaint  Consult  HPI  Semen color change: he states had intercourse with girlfriend two days ago and semen was rusty in color inside of the condom. He states they don't have sex frequently ( last time was about 2 weeks before), he does not masturbate. They had sex again yesterday without any problems, semen normal in color. He denies dysuria, urinary frequency, pelvic pain or pressure, fever  or increase in fatigue. He denies similar in the past. She just got checked for STI last week and he saw labs and all negative.    Patient Active Problem List   Diagnosis Date Noted  . Lateral epicondylitis, left elbow 03/18/2020  . Encounter for biometric screening 10/09/2019  . Depression, major, recurrent, mild ([place]) 08/28/2015  . Generalized anxiety disorder 08/28/2015  . IBS (irritable bowel syndrome) 04/03/2015  . Fatty tumor 12/31/2014  . Decorative tattoo 12/31/2014  . Asthma, cough variant 12/30/2014  . Insomnia 12/30/2014  . GERD (gastroesophageal reflux disease) 12/30/2014  . Perennial allergic rhinitis with seasonal variation 05/23/2007    Past Surgical History:  Procedure Laterality Date  . WISDOM TOOTH EXTRACTION      Family History  Problem Relation Age of Onset  . Cancer Mother        Breast  . Hyperlipidemia Mother   . Heart disease Father   . Hyperlipidemia Father   . Hypertension Father     Social History   Tobacco Use  . Smoking status: Never Smoker  . Smokeless tobacco: Never Used  Substance Use Topics  . Alcohol use: Yes    Alcohol/week: 0.0 standard drinks    Comment: rarely     Current Outpatient Medications:  .  albuterol (VENTOLIN HFA) 108 (90 Base) MCG/ACT inhaler, Inhale 2 puffs into the lungs every 6 (six) hours as needed for wheezing or shortness of breath., Disp: 18 g, Rfl: 0 .  FLUoxetine (PROZAC WEEKLY) 90 MG DR capsule,  TAKE 1 CAPSULE BY MOUTH EVERY 7 DAYS, Disp: 4 capsule, Rfl: 5  No Known Allergies  I personally reviewed active problem list, medication list, allergies, family history, social history, health maintenance with the patient/caregiver today.   ROS  Ten systems reviewed and is negative except as mentioned in HPI  Objective  Vitals:   09/02/20 1355  BP: 138/78  Pulse: 100  Resp: 17  Temp: 98.1 F (36.7 C)  TempSrc: Oral  SpO2: 99%  Weight: 187 lb (84.8 kg)  Height: 5\' 5"  (1.651 m)    Body mass index is 31.12 kg/m.  Physical Exam  Constitutional: Patient appears well-developed and well-nourished. Obese  No distress.  HEENT: head atraumatic, normocephalic, pupils equal and reactive to light,  neck supple Cardiovascular: Normal rate, regular rhythm and normal heart sounds.  No murmur heard. No BLE edema. Pulmonary/Chest: Effort normal and breath sounds normal. No respiratory distress. Abdominal: Soft.  There is no tenderness. Psychiatric: Patient has a normal mood and affect. behavior is normal. Judgment and thought content normal.  PHQ2/9: Depression screen [facility] 2/9 09/02/2020 05/28/2020 05/28/2020 08/17/2019 03/08/2019  Decreased Interest 0 0 0 0 0  Down, Depressed, Hopeless 0 0 0 0 0  PHQ - 2 Score 0 0 0 0 0  Altered sleeping - 1 - 0 1  Tired, decreased energy - 0 - 0 1  Change in appetite - 0 -  0 0  Feeling bad or failure about yourself  - 0 - 0 0  Trouble concentrating - 0 - 0 0  Moving slowly or fidgety/restless - 0 - 0 0  Suicidal thoughts - 0 - 0 0  PHQ-9 Score - 1 - 0 2  Difficult doing work/chores - Not difficult at all - Not difficult at all Not difficult at all  Some recent data might be hidden    phq 9 is negative   Fall Risk: Fall Risk  09/02/2020 05/28/2020 08/17/2019 03/08/2019 09/15/2018  Falls in the past year? 0 0 0 0 0  Number falls in past yr: 0 0 0 0 0  Injury with Fall? 0 0 0 0 0     Functional Status Survey: Is the patient deaf or have difficulty  hearing?: No Does the patient have difficulty seeing, even when wearing glasses/contacts?: No Does the patient have difficulty concentrating, remembering, or making decisions?: No Does the patient have difficulty walking or climbing stairs?: No Does the patient have difficulty dressing or bathing?: No Does the patient have difficulty doing errands alone such as visiting a doctor's office or shopping?: No    Assessment & Plan  1. Discolored semen   He states girlfriend just checked for STI and he saw the results. He will monitor and if happens again he will call back and referral will be placed for him to see Urologist

## 2020-09-11 ENCOUNTER — Encounter: Payer: Self-pay | Admitting: Family Medicine

## 2020-09-11 ENCOUNTER — Other Ambulatory Visit: Payer: Self-pay

## 2020-09-11 ENCOUNTER — Other Ambulatory Visit: Payer: Self-pay | Admitting: Family Medicine

## 2020-09-11 DIAGNOSIS — R868 Other abnormal findings in specimens from male genital organs: Secondary | ICD-10-CM

## 2020-09-24 ENCOUNTER — Other Ambulatory Visit: Payer: Self-pay

## 2020-09-24 ENCOUNTER — Ambulatory Visit: Payer: Managed Care, Other (non HMO) | Admitting: Urology

## 2020-09-24 ENCOUNTER — Encounter: Payer: Self-pay | Admitting: Urology

## 2020-09-24 VITALS — BP 150/99 | HR 60 | Ht 65.0 in | Wt 174.5 lb

## 2020-09-24 DIAGNOSIS — N411 Chronic prostatitis: Secondary | ICD-10-CM | POA: Diagnosis not present

## 2020-09-24 DIAGNOSIS — R361 Hematospermia: Secondary | ICD-10-CM | POA: Diagnosis not present

## 2020-09-24 NOTE — Progress Notes (Signed)
09/24/2020 12:11 PM   Anthony Hale January 31, 1986 [id number]  Referring provider: Steele Sizer, MD [address] [place],  [place] [address]  Chief Complaint  Patient presents with  . hematospermia    HPI: 35 year old male who presents today for further evaluation of hematospermia.  He reports having 2 discrete episodes of rust colored semen.  He had one a few weeks ago which he noted after ejaculating into a condom.  Subsequent ejaculate was clear with any had another rust colored.  Since then, its remain clear.  He denies any gross hematuria, urethral discharge, dysuria, or any other signs or symptoms of UTIs or STIs.  Both he and his partner been checked for STDs recently and are negative.  No family history of prostate cancer.  No flank pain or history of kidney stones.  He does mention that he has had a small amount of very subtle discomfort in his testicles which he is currently not experiencing without masses.  He also reports that he gets up at least once at night to urinate which is his baseline.  He sometimes has some lower back pain associated with this.   PMH: Past Medical History:  Diagnosis Date  . Allergy   . Anxiety   . Asthma   . GERD (gastroesophageal reflux disease)   . Insomnia   . Lipoma     Surgical History: Past Surgical History:  Procedure Laterality Date  . WISDOM TOOTH EXTRACTION      Home Medications:  Allergies as of 09/24/2020   No Known Allergies     Medication List       Accurate as of September 24, 2020 12:11 PM. If you have any questions, ask your nurse or doctor.        albuterol 108 (90 Base) MCG/ACT inhaler Commonly known as: VENTOLIN HFA Inhale 2 puffs into the lungs every 6 (six) hours as needed for wheezing or shortness of breath.   FLUoxetine 90 MG DR capsule Commonly known as: PROZAC WEEKLY TAKE 1 CAPSULE BY MOUTH EVERY 7 DAYS       Allergies: No Known Allergies  Family History: Family History   Problem Relation Age of Onset  . Cancer Mother        Breast  . Hyperlipidemia Mother   . Heart disease Father   . Hyperlipidemia Father   . Hypertension Father     Social History:  reports that he has never smoked. He has never used smokeless tobacco. He reports current alcohol use. He reports that he does not use drugs.   Physical Exam: BP (!) 150/99   Pulse 60   Ht 5\' 5"  (1.651 m)   Wt 174 lb 8 oz (79.2 kg)   BMI 29.04 kg/m   Constitutional:  Alert and oriented, No acute distress. HEENT: [place] AT, moist mucus membranes.  Trachea midline, no masses. Cardiovascular: No clubbing, cyanosis, or edema. Respiratory: Normal respiratory effort, no increased work of breathing. Skin: No rashes, bruises or suspicious lesions. Neurologic: Grossly intact, no focal deficits, moving all 4 extremities. Psychiatric: Normal mood and affect.  Laboratory Data: Lab Results  Component Value Date   WBC 5.1 05/29/2020   HGB 15.8 05/29/2020   HCT 46.0 05/29/2020   MCV 87.3 05/29/2020   PLT 247 05/29/2020    Lab Results  Component Value Date   CREATININE 1.00 05/29/2020    Lab Results  Component Value Date   HGBA1C 5.4 05/29/2020    Urinalysis Urinalysis today is  negative  Assessment & Plan:    1. Hematospermia  I explained to the patient some of the conditions that may cause hematospermia, such as: disorders of the prostate gland, seminal vesicles, spermatic cord, and ejaculatory duct system; urogenital infections including sexually transmitted infections (eg, chlamydia, herpes simplex virus, gonorrhea, trichomonas); metastatic cancers; vascular malformations; congenital and drug-induced bleeding disorders; and even frequent daily ejaculation over a period of several weeks.  Testicular exam by PCP was unremarkable.  I reassured him and that we may never discover a reason for his hematospermia and it is most likely a benign symptom.     Given his age, lack of comorbidities and absence  of blood in his urine as well as negative STI testing, I do not feel that any further investigation is warranted at this time.  Patient was reassured.  Given his recent exam by his PCP as well as his episodes are fairly isolated and not severe elected to defer repeat exam.  Discussed strict return precautions if worsening urinary symptoms, more severe episodes of hematospermia Development of gross hematuria or any other GU tract symptoms.  - Urinalysis, Complete  2. Chronic prostatitis Based on recent low back pain, subtle testicular discomfort without overt pain and nocturia, suspect he may have a very mild inflammatory prostatitis which would also explain #1.  At this point time, there is no concern for an infectious process.  I recommended supportive care with increasing fluids specifically water as well as anti-inflammatory such as ibuprofen.  If symptoms worsen or fail to improve, return or contact via mychart.  See prn  Hollice Espy, MD  [facility] [address], [place], [place] [address] [phone]

## 2020-09-25 LAB — MICROSCOPIC EXAMINATION
Bacteria, UA: NONE SEEN
Epithelial Cells (non renal): NONE SEEN /hpf (ref 0–10)
RBC, Urine: NONE SEEN /hpf (ref 0–2)

## 2020-09-25 LAB — URINALYSIS, COMPLETE
Bilirubin, UA: NEGATIVE
Glucose, UA: NEGATIVE
Ketones, UA: NEGATIVE
Leukocytes,UA: NEGATIVE
Nitrite, UA: NEGATIVE
Protein,UA: NEGATIVE
RBC, UA: NEGATIVE
Specific Gravity, UA: 1.025 (ref 1.005–1.030)
Urobilinogen, Ur: 0.2 mg/dL (ref 0.2–1.0)
pH, UA: 6.5 (ref 5.0–7.5)

## 2020-11-22 NOTE — Progress Notes (Deleted)
Name: Anthony Hale   MRN: [id number]    DOB: 1986/06/09   Date:11/22/2020       Progress Note  Subjective  Chief Complaint  Follow Up  HPI  OCD: he states that over the past6yearshe has noticed intrusive thoughts, has to double check doors ,making sure the alarm is set before going to bed multiple times. He is getting worried about hitting someone while driving, sometimes drives back to make sure he has not hit anyone. He needs to countto 4-6 times- it must be an even number -  when double checking anything ( like the alarm, car  door - needs to be an even number)He needs to organized everything in alphabetical order, books on a shelf has to be from large size to smaller size. He organizes his clothes by color. He gets irritated when his own  things are not organized. He states main symptom now is having to back to check he has not hit someone while driving   MDD/GAD:he had a severe episode of depression with weight loss, lack of motivation, difficulty focusing at work, but has been doing well for a while, taking medication weekly, he states depression is under control but still feels anxious, OCD symptoms are increasing again.   Dyslipidemia, low HDL: He is not on medication. No family history of sudden death or heart disease before age 44 for males or pre-menopausal for females. 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 Denies nocturnal cough, wheezing or sob at this time, uses Ventolin prn only . Advised flu shot but he refused    Patient Active Problem List   Diagnosis Date Noted  . Lateral epicondylitis, left elbow 03/18/2020  . Encounter for biometric screening 10/09/2019  . Depression, major, recurrent, mild ([place]) 08/28/2015  . Generalized anxiety disorder 08/28/2015  . IBS (irritable bowel syndrome) 04/03/2015  . Fatty tumor 12/31/2014  . Decorative  tattoo 12/31/2014  . Asthma, cough variant 12/30/2014  . Insomnia 12/30/2014  . GERD (gastroesophageal reflux disease) 12/30/2014  . Perennial allergic rhinitis with seasonal variation 05/23/2007    Past Surgical History:  Procedure Laterality Date  . WISDOM TOOTH EXTRACTION      Family History  Problem Relation Age of Onset  . Cancer Mother        Breast  . Hyperlipidemia Mother   . Heart disease Father   . Hyperlipidemia Father   . Hypertension Father     Social History   Tobacco Use  . Smoking status: Never Smoker  . Smokeless tobacco: Never Used  Substance Use Topics  . Alcohol use: Yes    Alcohol/week: 0.0 standard drinks    Comment: rarely     Current Outpatient Medications:  .  albuterol (VENTOLIN HFA) 108 (90 Base) MCG/ACT inhaler, Inhale 2 puffs into the lungs every 6 (six) hours as needed for wheezing or shortness of breath., Disp: 18 g, Rfl: 0 .  FLUoxetine (PROZAC WEEKLY) 90 MG DR capsule, TAKE 1 CAPSULE BY MOUTH EVERY 7 DAYS, Disp: 4 capsule, Rfl: 5  No Known Allergies  I personally reviewed {Reviewed:14835} with the patient/caregiver today.   ROS  ***  Objective  There were no vitals filed for this visit.  There is no height or weight on file to calculate BMI.  Physical Exam ***  Recent Results (from the past 2160 hour(s))  Urinalysis, Complete     Status:  None   Collection Time: 09/24/20  9:55 AM  Result Value Ref Range   Specific Gravity, UA 1.025 1.005 - 1.030   pH, UA 6.5 5.0 - 7.5   Color, UA Yellow Yellow   Appearance Ur Clear Clear   Leukocytes,UA Negative Negative   Protein,UA Negative Negative/Trace   Glucose, UA Negative Negative   Ketones, UA Negative Negative   RBC, UA Negative Negative   Bilirubin, UA Negative Negative   Urobilinogen, Ur 0.2 0.2 - 1.0 mg/dL   Nitrite, UA Negative Negative   Microscopic Examination See below:   Microscopic Examination     Status: None   Collection Time: 09/24/20  9:55 AM   Urine   Result Value Ref Range   WBC, UA 0-5 0 - 5 /hpf   RBC None seen 0 - 2 /hpf   Epithelial Cells (non renal) None seen 0 - 10 /hpf   Bacteria, UA None seen None seen/Few    Diabetic Foot Exam: Diabetic Foot Exam - Simple   No data filed    ***  PHQ2/9: Depression screen [facility] 2/9 09/02/2020 05/28/2020 05/28/2020 08/17/2019 03/08/2019  Decreased Interest 0 0 0 0 0  Down, Depressed, Hopeless 0 0 0 0 0  PHQ - 2 Score 0 0 0 0 0  Altered sleeping - 1 - 0 1  Tired, decreased energy - 0 - 0 1  Change in appetite - 0 - 0 0  Feeling bad or failure about yourself  - 0 - 0 0  Trouble concentrating - 0 - 0 0  Moving slowly or fidgety/restless - 0 - 0 0  Suicidal thoughts - 0 - 0 0  PHQ-9 Score - 1 - 0 2  Difficult doing work/chores - Not difficult at all - Not difficult at all Not difficult at all  Some recent data might be hidden    phq 9 is {gen pos [id number]} ***  Fall Risk: Fall Risk  09/02/2020 05/28/2020 08/17/2019 03/08/2019 09/15/2018  Falls in the past year? 0 0 0 0 0  Number falls in past yr: 0 0 0 0 0  Injury with Fall? 0 0 0 0 0   ***   Functional Status Survey:   ***   Assessment & Plan  *** There are no diagnoses linked to this encounter.

## 2020-11-25 ENCOUNTER — Ambulatory Visit: Payer: Managed Care, Other (non HMO) | Admitting: Family Medicine

## 2020-12-04 NOTE — Progress Notes (Signed)
Name: Anthony Hale   MRN: [id number]    DOB: 03-20-1986   Date:12/05/2020       Progress Note  Subjective  Chief Complaint  Follow up  HPI  Abdominal cramping and diarrhea: he states he has this episodes intermittently since childhood.  I looked through my notes and did not see any notes about it. He states he had some tests as a child and was given a diagnoses of IBS as a young adult. He states recently symptoms have been worse. Pain is very intense, waking him up from sleep and has diarrhea during the night. No blood or mucus in stools but has severe odor. He states he had bowel incontinence while driving to work about one month ago. He states during episodes diaphoretic, no nausea or vomiting. No weight loss and has normal appetite. He states he is so random that he cannot tell me how often in a month. Seems to bother him more when he is on call.   OCD: he states that over the past6yearshe has noticed intrusive thoughts, has to double check doors ,making sure the alarm is set before going to bed multiple times. He is getting worried about hitting someone while driving, sometimes drives back to make sure he has not hit anyone. He needs to countto 4-6 times- it must be an even number -  when double checking anything ( like the alarm, car  door - needs to be an even number)He needs to organized everything in alphabetical order, books on a shelf has to be from large size to smaller size. He organizes his clothes by color. He gets irritated when his own  things are not organized. He states main symptom now is having to back to check he has not hit someone while driving He states not as bad lately   MDD/GAD:he had a severe episode of depression with weight loss, lack of motivation, difficulty focusing at work,  taking medication weekly, but he states having problems with focus and concentration lately and is affecting his job. 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  Dyslipidemia, low HDL:  No family history of sudden death or heart disease before age 70 for males or pre-menopausal for females. Last LDL was high at 151 . He has been going to the gym more often, but not over the past week.   Cough variant asthma: he has a long history of cough variant asthma.. Denies nocturnal cough, wheezing or sob at this time, uses Ventolin prn only, and not recently   Hemospermia: seen by Urologist and was given reassurance, symptoms resolved  Patient Active Problem List   Diagnosis Date Noted  . Lateral epicondylitis, left elbow 03/18/2020  . Encounter for biometric screening 10/09/2019  . Depression, major, recurrent, mild ([place]) 08/28/2015  . Generalized anxiety disorder 08/28/2015  . IBS (irritable bowel syndrome) 04/03/2015  . Fatty tumor 12/31/2014  . Decorative tattoo 12/31/2014  . Asthma, cough variant 12/30/2014  . Insomnia 12/30/2014  . GERD (gastroesophageal reflux disease) 12/30/2014  . Perennial allergic rhinitis with seasonal variation 05/23/2007    Past Surgical History:  Procedure Laterality Date  . WISDOM TOOTH EXTRACTION      Family History  Problem Relation Age of Onset  . Cancer Mother        Breast  . Hyperlipidemia Mother   . Heart disease Father   .  Hyperlipidemia Father   . Hypertension Father     Social History   Tobacco Use  . Smoking status: Never Smoker  . Smokeless tobacco: Never Used  Substance Use Topics  . Alcohol use: Yes    Alcohol/week: 0.0 standard drinks    Comment: rarely     Current Outpatient Medications:  .  albuterol (VENTOLIN HFA) 108 (90 Base) MCG/ACT inhaler, Inhale 2 puffs into the lungs every 6 (six) hours as needed for wheezing or shortness of breath., Disp: 18 g, Rfl: 0 .  FLUoxetine (PROZAC WEEKLY) 90 MG DR capsule, TAKE 1 CAPSULE BY MOUTH EVERY 7 DAYS, Disp: 4 capsule, Rfl: 5  No Known Allergies  I personally reviewed  active problem list, medication list, allergies, family history, social history, health maintenance with the patient/caregiver today.   ROS  Constitutional: Negative for fever or weight change.  Respiratory: Negative for cough and shortness of breath.   Cardiovascular: Negative for chest pain or palpitations.  Gastrointestinal: positive  for abdominal pain, and  bowel changes.  Musculoskeletal: Negative for gait problem or joint swelling.  Skin: Negative for rash.  Neurological: Negative for dizziness or headache.  No other specific complaints in a complete review of systems (except as listed in HPI above).  Objective  Vitals:   12/05/20 0755  BP: 120/78  Pulse: 83  Resp: 16  Temp: 98.1 F (36.7 C)  TempSrc: Oral  SpO2: 99%  Weight: 175 lb (79.4 kg)  Height: 5\' 5"  (1.651 m)    Body mass index is 29.12 kg/m.  Physical Exam  Constitutional: Patient appears well-developed and well-nourished. No distress.  HEENT: head atraumatic, normocephalic, pupils equal and reactive to light,, neck supple Cardiovascular: Normal rate, regular rhythm and normal heart sounds.  No murmur heard. No BLE edema. Pulmonary/Chest: Effort normal and breath sounds normal. No respiratory distress. Abdominal: Soft.  There is no tenderness. Psychiatric: Patient has a normal mood and affect. behavior is normal. Judgment and thought content normal.  Recent Results (from the past 2160 hour(s))  Urinalysis, Complete     Status: None   Collection Time: 09/24/20  9:55 AM  Result Value Ref Range   Specific Gravity, UA 1.025 1.005 - 1.030   pH, UA 6.5 5.0 - 7.5   Color, UA Yellow Yellow   Appearance Ur Clear Clear   Leukocytes,UA Negative Negative   Protein,UA Negative Negative/Trace   Glucose, UA Negative Negative   Ketones, UA Negative Negative   RBC, UA Negative Negative   Bilirubin, UA Negative Negative   Urobilinogen, Ur 0.2 0.2 - 1.0 mg/dL   Nitrite, UA Negative Negative   Microscopic  Examination See below:   Microscopic Examination     Status: None   Collection Time: 09/24/20  9:55 AM   Urine  Result Value Ref Range   WBC, UA 0-5 0 - 5 /hpf   RBC None seen 0 - 2 /hpf   Epithelial Cells (non renal) None seen 0 - 10 /hpf   Bacteria, UA None seen None seen/Few      PHQ2/9: Depression screen [facility] 2/9 12/05/2020 09/02/2020 05/28/2020 05/28/2020 08/17/2019  Decreased Interest 0 0 0 0 0  Down, Depressed, Hopeless 1 0 0 0 0  PHQ - 2 Score 1 0 0 0 0  Altered sleeping 3 - 1 - 0  Tired, decreased energy 3 - 0 - 0  Change in appetite 0 - 0 - 0  Feeling bad or failure about yourself  0 - 0 - 0  Trouble concentrating 3 - 0 - 0  Moving slowly or fidgety/restless 0 - 0 - 0  Suicidal thoughts 0 - 0 - 0  PHQ-9 Score 10 - 1 - 0  Difficult doing work/chores - - Not difficult at all - Not difficult at all  Some recent data might be hidden    phq 9 is positive   Fall Risk: Fall Risk  12/05/2020 09/02/2020 05/28/2020 08/17/2019 03/08/2019  Falls in the past year? 0 0 0 0 0  Number falls in past yr: 0 0 0 0 0  Injury with Fall? 0 0 0 0 0     Functional Status Survey: Is the patient deaf or have difficulty hearing?: No Does the patient have difficulty seeing, even when wearing glasses/contacts?: No Does the patient have difficulty concentrating, remembering, or making decisions?: Yes Does the patient have difficulty walking or climbing stairs?: No Does the patient have difficulty dressing or bathing?: No Does the patient have difficulty doing errands alone such as visiting a doctor's office or shopping?: No    Assessment & Plan  1. Generalized abdominal pain  - Ambulatory referral to Gastroenterology  Sending levsin to pharmacy   2. Diarrhea, unspecified type  - Ambulatory referral to Gastroenterology  3. Low HDL (under 40)   4. Obsessive-compulsive disorder, unspecified type  - Ambulatory referral to Psychiatry  5. Generalized anxiety disorder  - Ambulatory  referral to Psychiatry  6. Dyslipidemia   7. Moderate recurrent major depression ([place])  - Ambulatory referral to Psychiatry  8. Asthma, cough variant

## 2020-12-05 ENCOUNTER — Ambulatory Visit: Payer: Managed Care, Other (non HMO) | Admitting: Family Medicine

## 2020-12-05 ENCOUNTER — Encounter: Payer: Self-pay | Admitting: Family Medicine

## 2020-12-05 ENCOUNTER — Other Ambulatory Visit: Payer: Self-pay

## 2020-12-05 VITALS — BP 120/78 | HR 83 | Temp 98.1°F | Resp 16 | Ht 65.0 in | Wt 175.0 lb

## 2020-12-05 DIAGNOSIS — F411 Generalized anxiety disorder: Secondary | ICD-10-CM

## 2020-12-05 DIAGNOSIS — E786 Lipoprotein deficiency: Secondary | ICD-10-CM

## 2020-12-05 DIAGNOSIS — R1084 Generalized abdominal pain: Secondary | ICD-10-CM

## 2020-12-05 DIAGNOSIS — F429 Obsessive-compulsive disorder, unspecified: Secondary | ICD-10-CM | POA: Diagnosis not present

## 2020-12-05 DIAGNOSIS — F331 Major depressive disorder, recurrent, moderate: Secondary | ICD-10-CM

## 2020-12-05 DIAGNOSIS — E785 Hyperlipidemia, unspecified: Secondary | ICD-10-CM

## 2020-12-05 DIAGNOSIS — J45991 Cough variant asthma: Secondary | ICD-10-CM

## 2020-12-05 DIAGNOSIS — R197 Diarrhea, unspecified: Secondary | ICD-10-CM | POA: Diagnosis not present

## 2020-12-05 MED ORDER — HYOSCYAMINE SULFATE SL 0.125 MG SL SUBL: 1.0000 | SUBLINGUAL_TABLET | Freq: Four times a day (QID) | SUBLINGUAL | 0 refills | Status: DC | PRN

## 2020-12-09 ENCOUNTER — Encounter: Payer: Self-pay | Admitting: *Deleted

## 2020-12-12 ENCOUNTER — Encounter: Payer: Self-pay | Admitting: Family Medicine

## 2020-12-13 ENCOUNTER — Other Ambulatory Visit: Payer: Self-pay | Admitting: Family Medicine

## 2020-12-13 DIAGNOSIS — R1084 Generalized abdominal pain: Secondary | ICD-10-CM

## 2020-12-13 NOTE — Telephone Encounter (Signed)
Requested medication (s) are due for refill today - no  Requested medication (s) are on the active medication list -yes  Future visit scheduled -yes  Last refill: 12/05/20  Notes to clinic: Request RF- medication not assigned to protocol  Requested Prescriptions  Pending Prescriptions Disp Refills   hyoscyamine (LEVSIN SL) 0.125 MG SL tablet [Pharmacy Med Name: HYOSCYAMINE 0.125 MG TAB SL] 60 tablet 0    Sig: PLACE 1 TABLET UNDER THE TONGUE 4 (FOUR) TIMES DAILY AS NEEDED.      Off-Protocol Failed - 12/13/2020  1:32 PM      Failed - Medication not assigned to a protocol, review manually.      Passed - Valid encounter within last 12 months    Recent Outpatient Visits           1 week ago Generalized abdominal pain   [facility] Steele Sizer, MD   3 months ago Discolored semen   [facility] Steele Sizer, MD   6 months ago Well adult exam   [facility] Steele Sizer, MD   1 year ago Elbow pain, left   [facility] Steele Sizer, MD   1 year ago Well adult exam   [facility] Steele Sizer, MD       Future Appointments             In 5 months Ancil Boozer, Drue Stager, MD [facility], [facility]                 Requested Prescriptions  Pending Prescriptions Disp Refills   hyoscyamine (LEVSIN SL) 0.125 MG SL tablet [Pharmacy Med Name: HYOSCYAMINE 0.125 MG TAB SL] 60 tablet 0    Sig: PLACE 1 TABLET UNDER THE TONGUE 4 (FOUR) TIMES DAILY AS NEEDED.      Off-Protocol Failed - 12/13/2020  1:32 PM      Failed - Medication not assigned to a protocol, review manually.      Passed - Valid encounter within last 12 months    Recent Outpatient Visits           1 week ago Generalized abdominal pain   [facility] Steele Sizer, MD   3 months ago Discolored semen   [facility] Steele Sizer, MD   6 months ago Well adult  exam   [facility] Steele Sizer, MD   1 year ago Elbow pain, left   [facility] Steele Sizer, MD   1 year ago Well adult exam   [facility] Steele Sizer, MD       Future Appointments             In 5 months Ancil Boozer, Drue Stager, MD [facility], [facility]

## 2020-12-17 NOTE — Telephone Encounter (Signed)
Pt has an appt on 06/03/21

## 2020-12-21 ENCOUNTER — Other Ambulatory Visit: Payer: Self-pay | Admitting: Family Medicine

## 2020-12-21 DIAGNOSIS — F411 Generalized anxiety disorder: Secondary | ICD-10-CM

## 2020-12-21 DIAGNOSIS — F331 Major depressive disorder, recurrent, moderate: Secondary | ICD-10-CM

## 2020-12-21 DIAGNOSIS — F429 Obsessive-compulsive disorder, unspecified: Secondary | ICD-10-CM

## 2020-12-21 NOTE — Telephone Encounter (Signed)
Requested Prescriptions  Pending Prescriptions Disp Refills  . FLUoxetine (PROZAC WEEKLY) 90 MG DR capsule [Pharmacy Med Name: FLUOXETINE DR 90 MG CAPSULE] 4 capsule 5    Sig: TAKE 1 CAPSULE BY MOUTH EVERY 7 DAYS     Psychiatry:  Antidepressants - SSRI Passed - 12/21/2020 11:41 AM      Passed - Completed PHQ-2 or PHQ-9 in the last 360 days      Passed - Valid encounter within last 6 months    Recent Outpatient Visits          2 weeks ago Generalized abdominal pain   [facility] Steele Sizer, MD   3 months ago Discolored semen   [facility] Steele Sizer, MD   6 months ago Well adult exam   [facility] Steele Sizer, MD   1 year ago Elbow pain, left   [facility] Steele Sizer, MD   1 year ago Well adult exam   [facility] Steele Sizer, MD      Future Appointments            In 5 months Ancil Boozer, Drue Stager, MD [facility], [facility]

## 2020-12-27 ENCOUNTER — Other Ambulatory Visit: Payer: Self-pay | Admitting: Family Medicine

## 2020-12-27 DIAGNOSIS — R1084 Generalized abdominal pain: Secondary | ICD-10-CM

## 2020-12-28 NOTE — Telephone Encounter (Signed)
12/17/20 #60 

## 2021-01-01 ENCOUNTER — Telehealth: Payer: Managed Care, Other (non HMO) | Admitting: Family Medicine

## 2021-01-07 ENCOUNTER — Encounter: Payer: Self-pay | Admitting: Gastroenterology

## 2021-01-07 ENCOUNTER — Telehealth (INDEPENDENT_AMBULATORY_CARE_PROVIDER_SITE_OTHER): Payer: Managed Care, Other (non HMO) | Admitting: Unknown Physician Specialty

## 2021-01-07 ENCOUNTER — Encounter: Payer: Self-pay | Admitting: Unknown Physician Specialty

## 2021-01-07 ENCOUNTER — Ambulatory Visit (INDEPENDENT_AMBULATORY_CARE_PROVIDER_SITE_OTHER): Payer: Managed Care, Other (non HMO) | Admitting: Gastroenterology

## 2021-01-07 ENCOUNTER — Other Ambulatory Visit: Payer: Self-pay

## 2021-01-07 VITALS — BP 159/92 | HR 65 | Temp 98.6°F | Ht 65.0 in | Wt 182.6 lb

## 2021-01-07 DIAGNOSIS — K589 Irritable bowel syndrome without diarrhea: Secondary | ICD-10-CM

## 2021-01-07 DIAGNOSIS — J0100 Acute maxillary sinusitis, unspecified: Secondary | ICD-10-CM | POA: Diagnosis not present

## 2021-01-07 MED ORDER — AMOXICILLIN 875 MG PO TABS: 875.0000 mg | ORAL_TABLET | Freq: Two times a day (BID) | ORAL | 0 refills | Status: DC

## 2021-01-07 NOTE — Progress Notes (Signed)
Jonathon Bellows MD, MRCP(U.K) [address]  [place]  [place], [place] [address]  Main: [phone]  Fax: [phone]   Gastroenterology Consultation  Referring Provider:     Steele Sizer, MD Primary Care Physician:  Steele Sizer, MD Primary Gastroenterologist:  Dr. Jonathon Bellows  Reason for Consultation:     Abdominal pain and diarrhea        HPI:   Anthony Hale is a 35 y.o. y/o male referred for consultation & management  by Dr. Ancil Boozer, Drue Stager, MD.     He has been referred for abdominal pain and diarrhea He states that he has always had issues with his GI tract since he was a child.  For the past few months he has noticed that right after he eats he has lower abdominal discomfort which is followed by an urge to defecate and usually the discomfort relieved after bowel movement.  He is not under significant stress.  He has association with bloating flatulence and gas.  Denies any rectal bleeding or weight loss.  No family history of colon cancer or polyps.  Consumes couple of sodas a day and some chewing gum on and off.  Diet is not rich in fiber.  Past Medical History:  Diagnosis Date   Allergy    Anxiety    Asthma    GERD (gastroesophageal reflux disease)    Insomnia    Lipoma     Past Surgical History:  Procedure Laterality Date   WISDOM TOOTH EXTRACTION      Prior to Admission medications   Medication Sig Start Date End Date Taking? Authorizing Provider  albuterol (VENTOLIN HFA) 108 (90 Base) MCG/ACT inhaler Inhale 2 puffs into the lungs every 6 (six) hours as needed for wheezing or shortness of breath. 05/28/20   Steele Sizer, MD  amoxicillin (AMOXIL) 875 MG tablet Take 1 tablet (875 mg total) by mouth 2 (two) times daily. 01/07/21   Kathrine Haddock, NP  FLUoxetine (PROZAC WEEKLY) 90 MG DR capsule TAKE 1 CAPSULE BY MOUTH EVERY 7 DAYS 12/21/20   Steele Sizer, MD  hyoscyamine (LEVSIN SL) 0.125 MG SL tablet PLACE 1 TABLET UNDER THE TONGUE 4 (FOUR) TIMES  DAILY AS NEEDED. 12/17/20   Steele Sizer, MD    Family History  Problem Relation Age of Onset   Cancer Mother        Breast   Hyperlipidemia Mother    Heart disease Father    Hyperlipidemia Father    Hypertension Father      Social History   Tobacco Use   Smoking status: Never   Smokeless tobacco: Never  Vaping Use   Vaping Use: Never used  Substance Use Topics   Alcohol use: Yes    Alcohol/week: 0.0 standard drinks    Comment: rarely   Drug use: No    Allergies as of 01/07/2021   (No Known Allergies)    Review of Systems:    All systems reviewed and negative except where noted in HPI.   Physical Exam:  Ht 5\' 5"  (1.651 m)   BMI 29.12 kg/m  No LMP for male patient. Psych:  Alert and cooperative. Normal mood and affect. General:   Alert,  Well-developed, well-nourished, pleasant and cooperative in NAD Head:  Normocephalic and atraumatic. Eyes:  Sclera clear, no icterus.   Conjunctiva pink. Ears:  Normal auditory acuity. Nose:  No deformity, discharge, or lesions. Mouth:  No deformity or lesions,oropharynx pink & moist. Neck:  Supple; no masses or  thyromegaly. Lungs:  Respirations even and unlabored.  Clear throughout to auscultation.   No wheezes, crackles, or rhonchi. No acute distress. Heart:  Regular rate and rhythm; no murmurs, clicks, rubs, or gallops. Abdomen:  Normal bowel sounds.  No bruits.  Soft, non-tender and non-distended without masses, hepatosplenomegaly or hernias noted.  No guarding or rebound tenderness.    Neurologic:  Alert and oriented x3;  grossly normal neurologically. Psych:  Alert and cooperative. Normal mood and affect.  Imaging Studies: No results found.  Assessment and Plan:   Anthony Hale is a 35 y.o. y/o male has been referred for abdominal pain and diarrhea.  History and presentation very suggestive of IBS/carbohydrate intolerance related to consumption of a large quantity of high fructose corn syrup and sodas.  Plan 1.   H. pylori breath test, celiac serology 2.  Stop all soda, artificial sugars and sweeteners and chewing gum  3.  Increase dietary fiber to 25 g of fiber per day.  Patient information provided. 4.  If no better at next visit we will consider further intervention including endoscopy   Follow up in 2 to 4 weeks video visit  Dr Jonathon Bellows MD,MRCP(U.K)

## 2021-01-07 NOTE — Progress Notes (Signed)
There were no vitals taken for this visit.   Subjective:    Patient ID: Anthony Hale, male    DOB: [birth year], 35 y.o.   MRN: [id number]  HPI: Anthony Hale is a 35 y.o. male  Chief Complaint  Patient presents with   Sinus Problem    Drainage, white spots back of throat, sore throat, facial pressure, nose burning. 2 negative Covid test  Due to the catastrophic nature of the COVID-19 pandemic, this visit was completed via audio and visual contact via [employer] due to the restrictions of the COVID-19 pandemic. All issues as above were discussed and addressed. Physical exam was done as above through visual confirmation on [facility] If it was felt that the patient should be evaluated in the office, they were directed there. The patient verbally consented to this visit."} Location of the patient: work Location of the provider: home Those involved with this call:  Provider: Kathrine Haddock, DNP CMA: Hope Budds Time spent on call:15 minutes I verified patient identity using two factors (patient name and date of birth). Patient consents verbally to being seen via telemedicine visit today.  Sinus Problem This is a new problem. The current episode started in the past 7 days. The problem has been waxing and waning since onset. There has been no fever. The pain is moderate. Associated symptoms include congestion, headaches, a hoarse voice, sinus pressure, a sore throat and swollen glands. Pertinent negatives include no chills, coughing, diaphoresis, ear pain, neck pain, shortness of breath or sneezing. Past treatments include nothing. The treatment provided no relief.    Relevant past medical, surgical, family and social history reviewed and updated as indicated. Interim medical history since our last visit reviewed. Allergies and medications reviewed and updated.  Review of Systems  Constitutional:  Negative for chills and diaphoresis.  HENT:  Positive for congestion, hoarse voice,  sinus pressure and sore throat. Negative for ear pain and sneezing.   Respiratory:  Negative for cough and shortness of breath.   Musculoskeletal:  Negative for neck pain.  Neurological:  Positive for headaches.   Per HPI unless specifically indicated above     Objective:    There were no vitals taken for this visit.  Wt Readings from Last 3 Encounters:  12/05/20 175 lb (79.4 kg)  09/24/20 174 lb 8 oz (79.2 kg)  09/02/20 187 lb (84.8 kg)    Physical Exam Constitutional:      General: He is not in acute distress.    Appearance: Normal appearance. He is well-developed.  HENT:     Head: Normocephalic and atraumatic.  Eyes:     General: Lids are normal. No scleral icterus.       Right eye: No discharge.        Left eye: No discharge.     Conjunctiva/sclera: Conjunctivae normal.  Neck:     Vascular: No carotid bruit or JVD.  Pulmonary:     Effort: Pulmonary effort is normal. No respiratory distress.  Abdominal:     Palpations: There is no hepatomegaly or splenomegaly.  Musculoskeletal:        General: Normal range of motion.     Cervical back: Normal range of motion and neck supple.  Skin:    General: Skin is warm and dry.     Coloration: Skin is not pale.     Findings: No rash.  Neurological:     Mental Status: He is alert and oriented to person, place, and time.  Psychiatric:  Behavior: Behavior normal.        Thought Content: Thought content normal.        Judgment: Judgment normal.    Results for orders placed or performed in visit on 09/24/20  Microscopic Examination   Urine  Result Value Ref Range   WBC, UA 0-5 0 - 5 /hpf   RBC None seen 0 - 2 /hpf   Epithelial Cells (non renal) None seen 0 - 10 /hpf   Bacteria, UA None seen None seen/Few  Urinalysis, Complete  Result Value Ref Range   Specific Gravity, UA 1.025 1.005 - 1.030   pH, UA 6.5 5.0 - 7.5   Color, UA Yellow Yellow   Appearance Ur Clear Clear   Leukocytes,UA Negative Negative   Protein,UA  Negative Negative/Trace   Glucose, UA Negative Negative   Ketones, UA Negative Negative   RBC, UA Negative Negative   Bilirubin, UA Negative Negative   Urobilinogen, Ur 0.2 0.2 - 1.0 mg/dL   Nitrite, UA Negative Negative   Microscopic Examination See below:       Assessment & Plan:   Problem List Items Addressed This Visit   None Visit Diagnoses     Acute non-recurrent maxillary sinusitis    -  Primary   Worsening sxs after 7 days.  Rx with Amoxil 875 mg BID for 10 days.  Support with Flonase, saline nasal spray, and Netti pot.    Relevant Medications   amoxicillin (AMOXIL) 875 MG tablet        Follow up plan: Return if symptoms worsen or fail to improve.

## 2021-01-07 NOTE — Patient Instructions (Signed)
High-Fiber Eating Plan Fiber, also called dietary fiber, is a type of carbohydrate. It is found foods such as fruits, vegetables, whole grains, and beans. A high-fiber diet can have many health benefits. Your health care provider may recommend a high-fiber diet to help: Prevent constipation. Fiber can make your bowel movements more regular. Lower your cholesterol. Relieve the following conditions: Inflammation of veins in the anus (hemorrhoids). Inflammation of specific areas of the digestive tract (uncomplicated diverticulosis). A problem of the large intestine, also called the colon, that sometimes causes pain and diarrhea (irritable bowel syndrome, or IBS). Prevent overeating as part of a weight-loss plan. Prevent heart disease, type 2 diabetes, and certain cancers. What are tips for following this plan? Reading food labels  Check the nutrition facts label on food products for the amount of dietary fiber. Choose foods that have 5 grams of fiber or more per serving. The goals for recommended daily fiber intake include: Men (age 55 or younger): 34-38 g. Men (over age 57): 28-34 g. Women (age 17 or younger): 25-28 g. Women (over age 104): 22-25 g. Your daily fiber goal is _____________ g. Shopping Choose whole fruits and vegetables instead of processed forms, such as apple juice or applesauce. Choose a wide variety of high-fiber foods such as avocados, lentils, oats, and kidney beans. Read the nutrition facts label of the foods you choose. Be aware of foods with added fiber. These foods often have high sugar and sodium amounts per serving. Cooking Use whole-grain flour for baking and cooking. Cook with brown rice instead of white rice. Meal planning Start the day with a breakfast that is high in fiber, such as a cereal that contains 5 g of fiber or more per serving. Eat breads and cereals that are made with whole-grain flour instead of refined flour or white flour. Eat brown rice, bulgur  wheat, or millet instead of white rice. Use beans in place of meat in soups, salads, and pasta dishes. Be sure that half of the grains you eat each day are whole grains. General information You can get the recommended daily intake of dietary fiber by: Eating a variety of fruits, vegetables, grains, nuts, and beans. Taking a fiber supplement if you are not able to take in enough fiber in your diet. It is better to get fiber through food than from a supplement. Gradually increase how much fiber you consume. If you increase your intake of dietary fiber too quickly, you may have bloating, cramping, or gas. Drink plenty of water to help you digest fiber. Choose high-fiber snacks, such as berries, raw vegetables, nuts, and popcorn. What foods should I eat? Fruits Berries. Pears. Apples. Oranges. Avocado. Prunes and raisins. Dried figs. Vegetables Sweet potatoes. Spinach. Kale. Artichokes. Cabbage. Broccoli. Cauliflower.Green peas. Carrots. Squash. Grains Whole-grain breads. Multigrain cereal. Oats and oatmeal. Brown rice. Barley.Bulgur wheat. [place]. Quinoa. Bran muffins. Popcorn. Rye wafer crackers. Meats and other proteins Navy beans, kidney beans, and pinto beans. Soybeans. Split peas. Lentils. Nutsand seeds. Dairy Fiber-fortified yogurt. Beverages Fiber-fortified soy milk. Fiber-fortified orange juice. Other foods Fiber bars. The items listed above may not be a complete list of recommended foods and beverages. Contact a dietitian for more information. What foods should I avoid? Fruits Fruit juice. Cooked, strained fruit. Vegetables Fried potatoes. Canned vegetables. Well-cooked vegetables. Grains White bread. Pasta made with refined flour. White rice. Meats and other proteins Fatty cuts of meat. Fried chicken or fried fish. Dairy Milk. Yogurt. Cream cheese. Sour cream. Fats and oils Butters. Beverages  Soft drinks. Other foods Cakes and pastries. The items listed above may not  be a complete list of foods and beverages to avoid. Talk with your dietitian about what choices are best for you. Summary Fiber is a type of carbohydrate. It is found in foods such as fruits, vegetables, whole grains, and beans. A high-fiber diet has many benefits. It can help to prevent constipation, lower blood cholesterol, aid weight loss, and reduce your risk of heart disease, diabetes, and certain cancers. Increase your intake of fiber gradually. Increasing fiber too quickly may cause cramping, bloating, and gas. Drink plenty of water while you increase the amount of fiber you consume. The best sources of fiber include whole fruits and vegetables, whole grains, nuts, seeds, and beans. This information is not intended to replace advice given to you by your health care provider. Make sure you discuss any questions you have with your healthcare provider. Document Revised: 11/09/2019 Document Reviewed: 11/09/2019 Elsevier Patient Education  2022 [place]. Diet for Irritable Bowel Syndrome When you have irritable bowel syndrome (IBS), it is very important to eat the foods and follow the eating habits that are best for your condition. IBS may cause various symptoms such as pain in the abdomen, constipation, or diarrhea. Choosing the right foods can help to ease the discomfort from these symptoms. Work with your health care provider and diet and nutrition specialist (dietitian) to find the eating plan that will help to control your symptoms. What are tips for following this plan?     Keep a food diary. This will help you identify foods that cause symptoms. Write down: What you eat and when you eat it. What symptoms you have. When symptoms occur in relation to your meals, such as "pain in abdomen 2 hours after dinner." Eat your meals slowly and in a relaxed setting. Aim to eat 5-6 small meals per day. Do not skip meals. Drink enough fluid to keep your urine pale yellow. Ask your health care  provider if you should take an over-the-counter probiotic to help restore healthy bacteria in your gut (digestive tract). Probiotics are foods that contain good bacteria and yeasts. Your dietitian may have specific dietary recommendations for you based on your symptoms. He or she may recommend that you: Avoid foods that cause symptoms. Talk with your dietitian about other ways to get the same nutrients that are in those problem foods. Avoid foods with gluten. Gluten is a protein that is found in rye, wheat, and barley. Eat more foods that contain soluble fiber. Examples of foods with high soluble fiber include oats, seeds, and certain fruits and vegetables. Take a fiber supplement if directed by your dietitian. Reduce or avoid certain foods called FODMAPs. These are foods that contain carbohydrates that are hard to digest. Ask your doctor which foods contain these carbohydrates. What foods are not recommended? The following are some foods and drinks that may make your symptoms worse: Fatty foods, such as french fries. Foods that contain gluten, such as pasta and cereal. Dairy products, such as milk, cheese, and ice cream. Chocolate. Alcohol. Products with caffeine, such as coffee. Carbonated drinks, such as soda. Foods that are high in FODMAPs. These include certain fruits and vegetables. Products with sweeteners such as honey, high fructose corn syrup, sorbitol, and mannitol. The items listed above may not be a complete list of foods and beverages youshould avoid. Contact a dietitian for more information. What foods are good sources of fiber? Your health care provider or dietitian  may recommend that you eat more foods that contain fiber. Fiber can help to reduce constipation and other IBS symptoms. Add foods with fiber to your diet a little at a time so your body can get used to them. Too much fiber at one time might cause gas and swelling of your abdomen. The following are some foods that are  good sources of fiber: Berries, such as raspberries, strawberries, and blueberries. Tomatoes. Carrots. Brown rice. Oats. Seeds, such as chia and pumpkin seeds. The items listed above may not be a complete list of recommended sources offiber. Contact your dietitian for more options. Where to find more information International Foundation for Functional Gastrointestinal Disorders: www.iffgd.[employer] of Diabetes and Digestive and Kidney Diseases: [employer] Summary When you have irritable bowel syndrome (IBS), it is very important to eat the foods and follow the eating habits that are best for your condition. IBS may cause various symptoms such as pain in the abdomen, constipation, or diarrhea. Choosing the right foods can help to ease the discomfort that comes from symptoms. Keep a food diary. This will help you identify foods that cause symptoms. Your health care provider or diet and nutrition specialist (dietitian) may recommend that you eat more foods that contain fiber. This information is not intended to replace advice given to you by your health care provider. Make sure you discuss any questions you have with your healthcare provider. Document Revised: 03/07/2020 Document Reviewed: 03/07/2020 Elsevier Patient Education  2022 [employer].

## 2021-01-11 LAB — CELIAC DISEASE AB SCREEN W/RFX
Antigliadin Abs, IgA: 2 units (ref 0–19)
IgA/Immunoglobulin A, Serum: 82 mg/dL — ABNORMAL LOW (ref 90–386)
Transglutaminase IgA: <2 U/mL (ref 0–3)

## 2021-01-11 LAB — H. PYLORI BREATH TEST: H pylori Breath Test: NEGATIVE

## 2021-01-16 ENCOUNTER — Other Ambulatory Visit: Payer: Self-pay | Admitting: Family Medicine

## 2021-01-16 DIAGNOSIS — F411 Generalized anxiety disorder: Secondary | ICD-10-CM

## 2021-01-16 DIAGNOSIS — F331 Major depressive disorder, recurrent, moderate: Secondary | ICD-10-CM

## 2021-01-16 DIAGNOSIS — F429 Obsessive-compulsive disorder, unspecified: Secondary | ICD-10-CM

## 2021-01-16 NOTE — Telephone Encounter (Signed)
  Notes to clinic: REQUEST FOR 90 DAYS PRESCRIPTION. DX Code Needed.    Requested Prescriptions  Pending Prescriptions Disp Refills   FLUoxetine (PROZAC WEEKLY) 90 MG DR capsule [Pharmacy Med Name: FLUOXETINE DR 90 MG CAPSULE] 12 capsule 2    Sig: TAKE 1 CAPSULE BY MOUTH ONE TIME PER WEEK      Psychiatry:  Antidepressants - SSRI Passed - 01/16/2021  1:31 PM      Passed - Completed PHQ-2 or PHQ-9 in the last 360 days      Passed - Valid encounter within last 6 months    Recent Outpatient Visits           1 week ago Acute non-recurrent maxillary sinusitis   Mather, NP   1 month ago Generalized abdominal pain   [facility] Steele Sizer, MD   4 months ago Discolored semen   [facility] Steele Sizer, MD   7 months ago Well adult exam   [facility] Steele Sizer, MD   1 year ago Elbow pain, left   [facility] Steele Sizer, MD       Future Appointments             In 2 weeks Jonathon Bellows, MD [place]   In 4 months Steele Sizer, MD [facility]

## 2021-01-16 NOTE — Telephone Encounter (Signed)
Next appt is 06/03/21

## 2021-01-17 ENCOUNTER — Telehealth: Payer: Self-pay

## 2021-01-17 DIAGNOSIS — K589 Irritable bowel syndrome without diarrhea: Secondary | ICD-10-CM

## 2021-01-17 NOTE — Telephone Encounter (Signed)
Called patient to let him know that Dr. Vicente Males is requesting additional labs to be drawn. Therefore, I told him that I would place an order and he could come here or go to [place] off of [address] and [address]. Patient agreed and will go on Tuesday.

## 2021-01-28 ENCOUNTER — Encounter: Payer: Self-pay | Admitting: Family Medicine

## 2021-02-04 ENCOUNTER — Other Ambulatory Visit: Payer: Self-pay

## 2021-02-04 ENCOUNTER — Ambulatory Visit: Payer: Managed Care, Other (non HMO) | Admitting: Gastroenterology

## 2021-02-04 DIAGNOSIS — F429 Obsessive-compulsive disorder, unspecified: Secondary | ICD-10-CM

## 2021-02-04 DIAGNOSIS — F331 Major depressive disorder, recurrent, moderate: Secondary | ICD-10-CM

## 2021-02-04 DIAGNOSIS — F411 Generalized anxiety disorder: Secondary | ICD-10-CM

## 2021-02-13 ENCOUNTER — Other Ambulatory Visit: Payer: Self-pay

## 2021-02-13 ENCOUNTER — Ambulatory Visit (INDEPENDENT_AMBULATORY_CARE_PROVIDER_SITE_OTHER): Payer: 59 | Admitting: Behavioral Health

## 2021-02-13 ENCOUNTER — Encounter: Payer: Self-pay | Admitting: Behavioral Health

## 2021-02-13 VITALS — BP 110/66 | HR 60 | Ht 65.0 in | Wt 173.0 lb

## 2021-02-13 DIAGNOSIS — F422 Mixed obsessional thoughts and acts: Secondary | ICD-10-CM | POA: Diagnosis not present

## 2021-02-13 DIAGNOSIS — G4700 Insomnia, unspecified: Secondary | ICD-10-CM | POA: Diagnosis not present

## 2021-02-13 DIAGNOSIS — F411 Generalized anxiety disorder: Secondary | ICD-10-CM | POA: Diagnosis not present

## 2021-02-13 MED ORDER — VILAZODONE HCL 20 MG PO TABS: 20.0000 mg | ORAL_TABLET | Freq: Every day | ORAL | 1 refills | Status: DC
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
## 2021-03-13 ENCOUNTER — Other Ambulatory Visit: Payer: Self-pay

## 2021-03-13 ENCOUNTER — Ambulatory Visit: Payer: 59 | Admitting: Behavioral Health

## 2021-03-13 ENCOUNTER — Encounter: Payer: Self-pay | Admitting: Behavioral Health

## 2021-03-13 DIAGNOSIS — F422 Mixed obsessional thoughts and acts: Secondary | ICD-10-CM | POA: Diagnosis not present

## 2021-03-13 DIAGNOSIS — F411 Generalized anxiety disorder: Secondary | ICD-10-CM

## 2021-03-13 DIAGNOSIS — G4700 Insomnia, unspecified: Secondary | ICD-10-CM

## 2021-03-13 MED ORDER — TRAZODONE HCL 50 MG PO TABS: 50.0000 mg | ORAL_TABLET | Freq: Every day | ORAL | 1 refills | Status: DC

## 2021-03-13 MED ORDER — VILAZODONE HCL 20 MG PO TABS
20.0000 mg | ORAL_TABLET | Freq: Every day | ORAL | 3 refills | Status: DC
Start: 2021-03-13 — End: 2021-05-28
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
## 2021-03-25 ENCOUNTER — Telehealth: Payer: Self-pay

## 2021-03-25 NOTE — Telephone Encounter (Signed)
Copied from [place] [id number]. Topic: General - Call Back - No Documentation >> Mar 25, 2021  3:18 PM [name] wrote: Reason for CRM: Pt wants to know his blood type, please advise   Best contact: [phone]

## 2021-03-25 NOTE — Telephone Encounter (Signed)
Talked to patient, he is coming in for CPE in November and would like a type and cross done at that time to determine his blood type. Patient states he has never been typed before. Declined prior blood donation.

## 2021-04-05 ENCOUNTER — Other Ambulatory Visit: Payer: Self-pay | Admitting: Behavioral Health

## 2021-04-05 DIAGNOSIS — G4700 Insomnia, unspecified: Secondary | ICD-10-CM

## 2021-04-08 ENCOUNTER — Ambulatory Visit: Payer: Managed Care, Other (non HMO) | Admitting: Gastroenterology

## 2021-05-28 ENCOUNTER — Ambulatory Visit: Payer: 59 | Admitting: Behavioral Health

## 2021-05-28 ENCOUNTER — Encounter: Payer: Self-pay | Admitting: Behavioral Health

## 2021-05-28 ENCOUNTER — Other Ambulatory Visit: Payer: Self-pay

## 2021-05-28 DIAGNOSIS — F411 Generalized anxiety disorder: Secondary | ICD-10-CM

## 2021-05-28 DIAGNOSIS — F422 Mixed obsessional thoughts and acts: Secondary | ICD-10-CM

## 2021-05-28 DIAGNOSIS — G4700 Insomnia, unspecified: Secondary | ICD-10-CM | POA: Diagnosis not present

## 2021-05-28 MED ORDER — VILAZODONE HCL 40 MG PO TABS: 40.0000 mg | ORAL_TABLET | Freq: Every day | ORAL | 3 refills | Status: DC

## 2021-05-28 MED ORDER — PROPRANOLOL HCL 10 MG PO TABS: 10.0000 mg | ORAL_TABLET | Freq: Three times a day (TID) | ORAL | 1 refills | Status: DC
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
## 2021-06-02 NOTE — Progress Notes (Signed)
Name: Anthony Hale   MRN: [id number]    DOB: 08-16-1985   Date:06/03/2021       Progress Note  Subjective  Chief Complaint  Annual Exam  HPI  Patient presents for annual CPE .   Diet: he will try to cut down on eating out - go down to about twice a week  Exercise: discussed importance of regular physical activity   Depression: phq 9 is positive, going to [facility]  Depression screen [facility] 2/9 06/03/2021 01/07/2021 12/05/2020 09/02/2020 05/28/2020  Decreased Interest 0 0 0 0 0  Down, Depressed, Hopeless 1 0 1 0 0  PHQ - 2 Score 1 0 1 0 0  Altered sleeping 3 0 3 - 1  Tired, decreased energy 3 0 3 - 0  Change in appetite 0 0 0 - 0  Feeling bad or failure about yourself  0 0 0 - 0  Trouble concentrating 0 0 3 - 0  Moving slowly or fidgety/restless 0 0 0 - 0  Suicidal thoughts 0 0 0 - 0  PHQ-9 Score 7 0 10 - 1  Difficult doing work/chores - Not difficult at all - - Not difficult at all  Some encounter information is confidential and restricted. Go to Review Flowsheets activity to see all data.  Some recent data might be hidden    Hypertension:  BP Readings from Last 3 Encounters:  06/03/21 126/82  01/07/21 (!) 159/92  12/05/20 120/78    Obesity: Wt Readings from Last 3 Encounters:  06/03/21 180 lb (81.6 kg)  01/07/21 182 lb 9.6 oz (82.8 kg)  12/05/20 175 lb (79.4 kg)   BMI Readings from Last 3 Encounters:  06/03/21 29.95 kg/m  01/07/21 30.39 kg/m  12/05/20 29.12 kg/m     Lipids:  Lab Results  Component Value Date   CHOL 213 (H) 05/29/2020   CHOL 212 (H) 10/09/2019   CHOL 196 02/09/2019   Lab Results  Component Value Date   HDL 36 (L) 05/29/2020   HDL 34 (L) 10/09/2019   HDL 35 (L) 02/09/2019   Lab Results  Component Value Date   LDLCALC 151 (H) 05/29/2020   LDLCALC 132 (H) 10/09/2019   LDLCALC 124 (H) 02/09/2019   Lab Results  Component Value Date   TRIG 136 05/29/2020   TRIG 257 (H) 10/09/2019   TRIG 184 (H) 02/09/2019   Lab Results   Component Value Date   CHOLHDL 5.9 (H) 05/29/2020   CHOLHDL 6.2 (H) 10/09/2019   CHOLHDL 4.9 03/17/2018   No results found for: LDLDIRECT Glucose:  Glucose  Date Value Ref Range Status  10/09/2019 91 65 - 99 mg/dL Final  02/09/2019 93 65 - 99 mg/dL Final   Glucose, Bld  Date Value Ref Range Status  05/29/2020 100 (H) 65 - 99 mg/dL Final    Comment:    .            Fasting reference interval . For someone without known diabetes, a glucose value between 100 and 125 mg/dL is consistent with prediabetes and should be confirmed with a follow-up test. .   03/08/2019 90 65 - 99 mg/dL Final    Comment:    .            Fasting reference interval .   03/17/2018 91 65 - 99 mg/dL Final    Comment:    .            Fasting reference interval .  Flowsheet Row Video Visit from 01/07/2021 in [facility]  AUDIT-C Score 0       Divorced STD testing and prevention (HIV/chl/gon/syphilis): 05/29/20 - not interested today  Hep C: 05/29/20  Skin cancer: Discussed monitoring for atypical lesions Colorectal cancer: N/A Prostate cancer: no family history    ECG:  11/02/09  Vaccines:  Pneumonia: educated and discussed with patient. Flu: up to date   Advanced Care Planning: A voluntary discussion about advance care planning including the explanation and discussion of advance directives.  Discussed health care proxy and Living will, and the patient was able to identify a health care proxy as  parents   Patient Active Problem List   Diagnosis Date Noted   Lateral epicondylitis, left elbow 03/18/2020   Encounter for biometric screening 10/09/2019   Depression, major, recurrent, mild ([place]) 08/28/2015   Generalized anxiety disorder 08/28/2015   IBS (irritable bowel syndrome) 04/03/2015   Fatty tumor 12/31/2014   Decorative tattoo 12/31/2014   Asthma, cough variant 12/30/2014   Insomnia 12/30/2014   GERD (gastroesophageal reflux disease) 12/30/2014    Perennial allergic rhinitis with seasonal variation 05/23/2007    Past Surgical History:  Procedure Laterality Date   WISDOM TOOTH EXTRACTION      Family History  Problem Relation Age of Onset   Cancer Mother        Breast   Hyperlipidemia Mother    Heart disease Father    Hyperlipidemia Father    Hypertension Father     Social History   Socioeconomic History   Marital status: Divorced    Spouse name: Not on file   Number of children: 0   Years of education: Not on file   Highest education level: Master's degree (e.g., MA, MS, MEng, MEd, MSW, MBA)  Occupational History   Occupation: Quarry manager  Tobacco Use   Smoking status: Never   Smokeless tobacco: Never  Vaping Use   Vaping Use: Never used  Substance and Sexual Activity   Alcohol use: Yes    Alcohol/week: 0.0 standard drinks    Comment: rarely   Drug use: No   Sexual activity: Yes    Partners: Female  Other Topics Concern   Not on file  Social History Narrative   Married on March 19th, 2017, marriage was never consummated and she left on June 25 th, 2017   Divorced Summer 2018   Still work as a Nutritional therapist for [employer]   He has a Production assistant, radio in Database administrator ( Conservator, museum/gallery and criminal justice administration)    Currently Living in [place] Strain: Low Risk    Difficulty of Paying Living Expenses: Not hard at [employer] Insecurity: No Food Insecurity   Worried About Charity fundraiser in the Last Year: Never true   Arboriculturist in the Last Year: Never true  Transportation Needs: No Transportation Needs   Lack of Transportation (Medical): No   Lack of Transportation (Non-Medical): No  Physical Activity: Inactive   Days of Exercise per Week: 0 days   Minutes of Exercise per Session: 0 min  Stress: Stress Concern Present   Feeling of Stress : Rather much  Social Connections:  Moderately Isolated   Frequency of Communication with Friends and Family: More than three times a week   Frequency of Social Gatherings with Friends and Family: More than three times a  week   Attends Religious Services: Never   Active Member of Clubs or Organizations: No   Attends Music therapist: More than 4 times per year   Marital Status: Divorced  Human resources officer Violence: Not At Risk   Fear of Current or Ex-Partner: No   Emotionally Abused: No   Physically Abused: No   Sexually Abused: No     Current Outpatient Medications:    albuterol (VENTOLIN HFA) 108 (90 Base) MCG/ACT inhaler, Inhale 2 puffs into the lungs every 6 (six) hours as needed for wheezing or shortness of breath., Disp: 18 g, Rfl: 0   hyoscyamine (LEVSIN SL) 0.125 MG SL tablet, PLACE 1 TABLET UNDER THE TONGUE 4 (FOUR) TIMES DAILY AS NEEDED., Disp: 60 tablet, Rfl: 0   propranolol (INDERAL) 10 MG tablet, Take 1 tablet (10 mg total) by mouth 3 (three) times daily., Disp: 90 tablet, Rfl: 1   traZODone (DESYREL) 50 MG tablet, TAKE 1 TABLET BY MOUTH EVERYDAY AT BEDTIME, Disp: 90 tablet, Rfl: 0   Vilazodone HCl (VIIBRYD) 40 MG TABS, Take 1 tablet (40 mg total) by mouth daily., Disp: 30 tablet, Rfl: 3  No Known Allergies   ROS  Constitutional: Negative for fever or weight change.  Respiratory: Negative for cough and shortness of breath.   Cardiovascular: Negative for chest pain or palpitations.  Gastrointestinal: Negative for abdominal pain, no bowel changes.  Musculoskeletal: Negative for gait problem or joint swelling.  Skin: Negative for rash.  Neurological: Negative for dizziness or headache.  No other specific complaints in a complete review of systems (except as listed in HPI above).    Objective  Vitals:   06/03/21 0812  BP: 126/82  Pulse: 73  Resp: 16  Temp: 98.4 F (36.9 C)  SpO2: 98%  Weight: 180 lb (81.6 kg)  Height: 5\' 5"  (1.651 m)    Body mass index is 29.95 kg/m.  Physical  Exam  Constitutional: Patient appears well-developed and well-nourished. No distress.  HENT: Head: Normocephalic and atraumatic. Ears: B TMs ok, no erythema or effusion; Nose: Not done . Mouth/Throat: not done  Eyes: Conjunctivae and EOM are normal. Pupils are equal, round, and reactive to light. No scleral icterus.  Neck: Normal range of motion. Neck supple. No JVD present. No thyromegaly present.  Cardiovascular: Normal rate, regular rhythm and normal heart sounds.  No murmur heard. No BLE edema. Pulmonary/Chest: Effort normal and breath sounds normal. No respiratory distress. Abdominal: Soft. Bowel sounds are normal, no distension. There is no tenderness. no masses MALE GENITALIA: Normal descended testes bilaterally, no masses palpated, no hernias, no lesions, no discharge RECTAL:not done  Musculoskeletal: Normal range of motion, no joint effusions. No gross deformities Neurological: he is alert and oriented to person, place, and time. No cranial nerve deficit. Coordination, balance, strength, speech and gait are normal.  Skin: Skin is warm and dry. No rash noted. No erythema.  Psychiatric: Patient has a normal mood and affect. behavior is normal. Judgment and thought content normal.    Fall Risk: Fall Risk  06/03/2021 01/07/2021 12/05/2020 09/02/2020 05/28/2020  Falls in the past year? 0 0 0 0 0  Number falls in past yr: 0 0 0 0 0  Injury with Fall? 0 0 0 0 0  Risk for fall due to : No Fall Risks - - - -  Follow up Falls prevention discussed Falls evaluation completed - - -     Functional Status Survey: Is the patient deaf or have difficulty hearing?: No  Does the patient have difficulty seeing, even when wearing glasses/contacts?: No Does the patient have difficulty concentrating, remembering, or making decisions?: No Does the patient have difficulty walking or climbing stairs?: No Does the patient have difficulty dressing or bathing?: No Does the patient have difficulty doing  errands alone such as visiting a doctor's office or shopping?: No    Assessment & Plan  1. Well adult exam  - Lipid panel - CBC with Differential/Platelet - COMPLETE METABOLIC PANEL WITH GFR - TSH - Hemoglobin A1c  2. Dyslipidemia  - Lipid panel  3. Hyperglycemia  - Hemoglobin A1c  4. Long-term use of high-risk medication  - CBC with Differential/Platelet - COMPLETE METABOLIC PANEL WITH GFR - TSH  5. Weight gain  - TSH  6. Need for vaccination for pneumococcus   Today   -Prostate cancer screening and PSA options (with potential risks and benefits of testing vs not testing) were discussed along with recent recs/guidelines. -USPSTF grade A and B recommendations reviewed with patient; age-appropriate recommendations, preventive care, screening tests, etc discussed and encouraged; healthy living encouraged; see AVS for patient education given to patient -Discussed importance of 150 minutes of physical activity weekly, eat two servings of fish weekly, eat one serving of tree nuts ( cashews, pistachios, pecans, almonds.Marland Kitchen) every other day, eat 6 servings of fruit/vegetables daily and drink plenty of water and avoid sweet beverages.

## 2021-06-03 ENCOUNTER — Encounter: Payer: Self-pay | Admitting: Family Medicine

## 2021-06-03 ENCOUNTER — Ambulatory Visit (INDEPENDENT_AMBULATORY_CARE_PROVIDER_SITE_OTHER): Payer: Managed Care, Other (non HMO) | Admitting: Family Medicine

## 2021-06-03 ENCOUNTER — Other Ambulatory Visit: Payer: Self-pay

## 2021-06-03 VITALS — BP 126/82 | HR 73 | Temp 98.4°F | Resp 16 | Ht 65.0 in | Wt 180.0 lb

## 2021-06-03 DIAGNOSIS — E785 Hyperlipidemia, unspecified: Secondary | ICD-10-CM

## 2021-06-03 DIAGNOSIS — Z0000 Encounter for general adult medical examination without abnormal findings: Secondary | ICD-10-CM

## 2021-06-03 DIAGNOSIS — R739 Hyperglycemia, unspecified: Secondary | ICD-10-CM | POA: Diagnosis not present

## 2021-06-03 DIAGNOSIS — Z23 Encounter for immunization: Secondary | ICD-10-CM

## 2021-06-03 DIAGNOSIS — Z79899 Other long term (current) drug therapy: Secondary | ICD-10-CM | POA: Diagnosis not present

## 2021-06-03 DIAGNOSIS — R635 Abnormal weight gain: Secondary | ICD-10-CM

## 2021-06-04 ENCOUNTER — Ambulatory Visit: Payer: Managed Care, Other (non HMO) | Admitting: Gastroenterology

## 2021-06-04 LAB — COMPLETE METABOLIC PANEL WITH GFR
AG Ratio: 2 (calc) (ref 1.0–2.5)
ALT: 51 U/L — ABNORMAL HIGH (ref 9–46)
AST: 22 U/L (ref 10–40)
Albumin: 4.7 g/dL (ref 3.6–5.1)
Alkaline phosphatase (APISO): 71 U/L (ref 36–130)
BUN: 15 mg/dL (ref 7–25)
CO2: 30 mmol/L (ref 20–32)
Calcium: 9.5 mg/dL (ref 8.6–10.3)
Chloride: 104 mmol/L (ref 98–110)
Creat: 1.23 mg/dL (ref 0.60–1.26)
Globulin: 2.3 g/dL (calc) (ref 1.9–3.7)
Glucose, Bld: 95 mg/dL (ref 65–99)
Potassium: 4.3 mmol/L (ref 3.5–5.3)
Sodium: 141 mmol/L (ref 135–146)
Total Bilirubin: 0.6 mg/dL (ref 0.2–1.2)
Total Protein: 7 g/dL (ref 6.1–8.1)
eGFR: 78 mL/min/{1.73_m2} (ref >=60)

## 2021-06-04 LAB — CBC WITH DIFFERENTIAL/PLATELET
Absolute Monocytes: 533 cells/uL (ref 200–950)
Basophils Absolute: 50 cells/uL (ref 0–200)
Basophils Relative: 0.8 %
Eosinophils Absolute: 112 cells/uL (ref 15–500)
Eosinophils Relative: 1.8 %
HCT: 46.1 % (ref 38.5–50.0)
Hemoglobin: 15.6 g/dL (ref 13.2–17.1)
Lymphs Abs: 1550 cells/uL (ref 850–3900)
MCH: 30.4 pg (ref 27.0–33.0)
MCHC: 33.8 g/dL (ref 32.0–36.0)
MCV: 89.7 fL (ref 80.0–100.0)
MPV: 10 fL (ref 7.5–12.5)
Monocytes Relative: 8.6 %
Neutro Abs: 3956 cells/uL (ref 1500–7800)
Neutrophils Relative %: 63.8 %
Platelets: 257 10*3/uL (ref 140–400)
RBC: 5.14 10*6/uL (ref 4.20–5.80)
RDW: 12.6 % (ref 11.0–15.0)
Total Lymphocyte: 25 %
WBC: 6.2 10*3/uL (ref 3.8–10.8)

## 2021-06-04 LAB — LIPID PANEL
Cholesterol: 205 mg/dL — ABNORMAL HIGH (ref <200)
HDL: 38 mg/dL — ABNORMAL LOW (ref >=40)
LDL Cholesterol (Calc): 141 mg/dL (calc) — ABNORMAL HIGH
Non-HDL Cholesterol (Calc): 167 mg/dL (calc) — ABNORMAL HIGH (ref <130)
Total CHOL/HDL Ratio: 5.4 (calc) — ABNORMAL HIGH (ref <5.0)
Triglycerides: 135 mg/dL (ref <150)

## 2021-06-04 LAB — TSH: TSH: 1.47 mIU/L (ref 0.40–4.50)

## 2021-06-04 LAB — HEMOGLOBIN A1C
Hgb A1c MFr Bld: 5.4 % of total Hgb (ref <5.7)
Mean Plasma Glucose: 108 mg/dL
eAG (mmol/L): 6 mmol/L

## 2021-06-16 ENCOUNTER — Ambulatory Visit (INDEPENDENT_AMBULATORY_CARE_PROVIDER_SITE_OTHER): Payer: 59 | Admitting: Behavioral Health

## 2021-06-16 ENCOUNTER — Encounter: Payer: Self-pay | Admitting: Behavioral Health

## 2021-06-16 ENCOUNTER — Other Ambulatory Visit: Payer: Self-pay

## 2021-06-16 DIAGNOSIS — G4700 Insomnia, unspecified: Secondary | ICD-10-CM

## 2021-06-16 DIAGNOSIS — F422 Mixed obsessional thoughts and acts: Secondary | ICD-10-CM | POA: Diagnosis not present

## 2021-06-16 DIAGNOSIS — F411 Generalized anxiety disorder: Secondary | ICD-10-CM | POA: Diagnosis not present

## 2021-06-16 DIAGNOSIS — F330 Major depressive disorder, recurrent, mild: Secondary | ICD-10-CM

## 2021-06-16 MED ORDER — TRAZODONE HCL 50 MG PO TABS
ORAL_TABLET | ORAL | 2 refills | Status: DC
Start: 2021-06-16 — End: 2022-10-20

## 2021-06-16 MED ORDER — PROPRANOLOL HCL 10 MG PO TABS
10.0000 mg | ORAL_TABLET | Freq: Three times a day (TID) | ORAL | 3 refills | Status: DC
Start: 2021-06-16 — End: 2023-06-02

## 2021-06-16 NOTE — Progress Notes (Signed)
[facility]  Patient ID: Anthony Hale,  MRN: [id number]  PCP: Steele Sizer, MD  Date of Evaluation: 06/16/2021 Time spent:30 minutes  Chief Complaint:  Chief Complaint   Depression; Anxiety; OCD; Medication Refill; Follow-up; Insomnia     HISTORY/CURRENT STATUS: HPI  35 year old male presents to this office for follow up and medication management. He says that he is feeling better since increasing his Viibryd to 40 mg.  Say that he was cleared by the psychologist the [facility]  had arranged for him to see. Says that he was advised he could return to full duty with no limitations. Says that he takes his medication as prescribed. Anxiety today is 3/10 and depression 3/10. 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 He agrees to use to help with the quality of sleep.  He denies mania, no psychosis, no SI/HI.   Past psychiatric medication trials: Wellbutrin Trazodone Prozac             Individual Medical History/ Review of Systems: Changes? :No   Allergies: Patient has no known allergies.  Current Medications:  Current Outpatient Medications:    albuterol (VENTOLIN HFA) 108 (90 Base) MCG/ACT inhaler, Inhale 2 puffs into the lungs every 6 (six) hours as needed for wheezing or shortness of breath., Disp: 18 g, Rfl: 0   hyoscyamine (LEVSIN SL) 0.125 MG SL tablet, PLACE 1 TABLET UNDER THE TONGUE 4 (FOUR) TIMES DAILY AS NEEDED., Disp: 60 tablet, Rfl: 0   propranolol (INDERAL) 10 MG tablet, Take 1 tablet (10 mg total) by mouth 3 (three) times daily., Disp: 90 tablet, Rfl: 3   traZODone (DESYREL) 50 MG tablet, TAKE 1 TABLET BY MOUTH EVERYDAY AT BEDTIME, Disp: 90 tablet, Rfl: 2   Vilazodone HCl (VIIBRYD) 40 MG TABS, Take 1 tablet (40 mg total) by mouth daily., Disp: 30 tablet, Rfl: 3 Medication Side Effects: none  Family Medical/ Social History: Changes? No  MENTAL HEALTH  EXAM:  There were no vitals taken for this visit.There is no height or weight on file to calculate BMI.  General Appearance: Casual and Neat  Eye Contact:  Good  Speech:  Clear and Coherent  Volume:  Normal  Mood:  Anxious and Depressed  Affect:  Appropriate  Thought Process:  Coherent  Orientation:  Full (Time, Place, and Person)  Thought Content: Logical   Suicidal Thoughts:  No  Homicidal Thoughts:  No  Memory:  WNL  Judgement:  Good  Insight:  Good  Psychomotor Activity:  Normal  Concentration:  Concentration: Good  Recall:  Good  Fund of Knowledge: Good  Language: Good  Assets:  Desire for Improvement  ADL's:  Intact  Cognition: WNL  Prognosis:  Good    DIAGNOSES:    ICD-10-CM   1. Generalized anxiety disorder  F41.1 propranolol (INDERAL) 10 MG tablet    2. Mixed obsessional thoughts and acts  F42.2 propranolol (INDERAL) 10 MG tablet    3. Insomnia, unspecified type  G47.00 propranolol (INDERAL) 10 MG tablet    traZODone (DESYREL) 50 MG tablet    4. 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  To report any side effects or worsening symptoms promptly Will follow up in 3 weeks to reassess Reinforced taking medications exactly as prescribed and reviewed good sleep hygiene.  Provided emergency contact information Greater than 50% of 30 min face to face time with patient was spent on counseling and coordination of care. Discussed his improvements with compulsive behaviors such as returning to parking lots or stop light to check if he had struck another car or object.  He is currently experiencing an increase in anxiety duet to work stressors and stimulus related to job task. 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 I highly recommend that pt locate therapist that can probe more deeply with PTSD or trauma. CBT  or DBT would be helpful with with compulsive behaviors and behaviors related to trauma. Explained that for this disorder therapy plus medications produce better outcomes. Escribed increased dosage of Viibryd to patients pharmacy.       Elwanda Brooklyn, NP

## 2021-06-18 ENCOUNTER — Ambulatory Visit (INDEPENDENT_AMBULATORY_CARE_PROVIDER_SITE_OTHER): Payer: Managed Care, Other (non HMO) | Admitting: Gastroenterology

## 2021-06-18 ENCOUNTER — Encounter: Payer: Self-pay | Admitting: Gastroenterology

## 2021-06-18 VITALS — BP 151/83 | HR 59 | Temp 98.8°F | Wt 180.0 lb

## 2021-06-18 DIAGNOSIS — R140 Abdominal distension (gaseous): Secondary | ICD-10-CM | POA: Diagnosis not present

## 2021-06-18 NOTE — Progress Notes (Signed)
    Anthony Bellows MD, MRCP(U.K) [address]  [place]  [place], [place] [address]  Main: [phone]  Fax: [phone]   Primary Care Physician: Steele Sizer, MD  Primary Gastroenterologist:  Dr. Jonathon Hale   Chief Complaint  Patient presents with   Irritable Bowel Syndrome    HPI: Anthony Hale is a 35 y.o. male    Summary of history :  Initially referred and seen on 01/07/2021 for abdominal pain and diarrhea.  He had a lot of bloating gas and flatulence.  Was consuming large quantities of high fructose corn syrup and sodas and is advised to cut them down.    Interval history   01/07/2021-06/18/2021   01/07/2021: Celiac serology negative, H. pylori breath test negative, IgA levels were low hence requested to check TTG IgG, hemoglobin 15.6, CMP normal, TSH normal, HbA1c normal. Presently still drinking 2 sodas a day.  He has clearly noticed that when he drinks more soda there is no gas and bloating and when he drinks more soda as he has more gas and bloating.  No other symptoms.   Current Outpatient Medications  Medication Sig Dispense Refill   albuterol (VENTOLIN HFA) 108 (90 Base) MCG/ACT inhaler Inhale 2 puffs into the lungs every 6 (six) hours as needed for wheezing or shortness of breath. 18 g 0   hyoscyamine (LEVSIN SL) 0.125 MG SL tablet PLACE 1 TABLET UNDER THE TONGUE 4 (FOUR) TIMES DAILY AS NEEDED. 60 tablet 0   propranolol (INDERAL) 10 MG tablet Take 1 tablet (10 mg total) by mouth 3 (three) times daily. 90 tablet 3   traZODone (DESYREL) 50 MG tablet TAKE 1 TABLET BY MOUTH EVERYDAY AT BEDTIME 90 tablet 2   Vilazodone HCl (VIIBRYD) 40 MG TABS Take 1 tablet (40 mg total) by mouth daily. 30 tablet 3   No current facility-administered medications for this visit.    Allergies as of 06/18/2021   (No Known Allergies)    ROS:  General: Negative for anorexia, weight loss, fever, chills, fatigue, weakness. ENT: Negative for hoarseness, difficulty swallowing ,  nasal congestion. CV: Negative for chest pain, angina, palpitations, dyspnea on exertion, peripheral edema.  Respiratory: Negative for dyspnea at rest, dyspnea on exertion, cough, sputum, wheezing.  GI: See history of present illness. GU:  Negative for dysuria, hematuria, urinary incontinence, urinary frequency, nocturnal urination.  Endo: Negative for unusual weight change.    Physical Examination:   BP (!) 151/83   Pulse (!) 59   Temp 98.8 F (37.1 C) (Oral)   Wt 180 lb (81.6 kg)   BMI 29.95 kg/m   General: Well-nourished, well-developed in no acute distress.  Eyes: No icterus. Conjunctivae pink. Neuro: Alert and oriented x 3.  Grossly intact. Skin: Warm and dry, no jaundice.   Psych: Alert and cooperative, normal mood and affect.   Imaging Studies: No results found.  Assessment and Plan:   Anthony Hale is a 35 y.o. y/o male here to follow for gas and bloating which is very likely secondary to excess fructose corn syrup intake.  His symptoms correlate with the quantity of soda that he consumes.  Advised to cut down or limit intake to a point where he can manage symptoms.  Plan 1.  Due to low IgA levels check TTG IgG 2.  Cut down on high fructose considered 3.  Activate charcoal tablets as needed.  Dr Anthony Bellows  MD,MRCP [facility]) Follow up in as needed

## 2021-06-18 NOTE — Patient Instructions (Signed)
If you feel bloated, please take charcoal tablets.  Charcoal tablets or capsules What is this medication? CHARCOAL (CHAR kole) is a dietary supplement. It is used to absorb gases in the stomach that cause stomach gas. Do not use this supplement to treat poisonings or overdose. The FDA has not approved this supplement for any medical use. This medicine may be used for other purposes; ask your health care provider or pharmacist if you have questions. COMMON BRAND NAME(S): Charcoal Plus DS, CharcoCap, CharcoCaps Anti-Gas What should I tell my care team before I take this medication? They need to know if you have any of these conditions: food or medicine poisoning have frequent heartburn or gas have recently traveled to another country stomach or intestinal disease an unusual or allergic reaction to charcoal, other medicines, foods, dyes, or preservatives pregnant or trying to get pregnant breast-feeding How should I use this medication? Take by mouth with a glass of water. Follow the directions on the package label or use as directed by a health care provider. Do not take this medicine more often than directed. Talk to your pediatrician regarding the use of this medicine in children. Special care may be needed. Overdosage: If you think you have taken too much of this medicine contact a poison control center or emergency room at once. NOTE: This medicine is only for you. Do not share this medicine with others. What if I miss a dose? If you miss a dose, take it as soon as you can. If it is almost time for your next dose, take only that dose. Do not take double or extra doses. What may interact with this medication? Do not take this medicine with any of the following medications: ipecac This medicine may also interact with the following medications: acarbose aripiprazole birth control pills carbamazepine dapsone digoxin olanzapine phenothiazines like chlorpromazine, mesoridazine,  prochlorperazine, thioridazine phenytoin pindolol some herbal medicines or dietary supplements theophylline ursodeoxycholic acid This list may not describe all possible interactions. Give your health care provider a list of all the medicines, herbs, non-prescription drugs, or dietary supplements you use. Also tell them if you smoke, drink alcohol, or use illegal drugs. Some items may interact with your medicine. What should I watch for while using this medication? Tell your doctor or healthcare professional if your symptoms do not start to get better or if they get worse. See your doctor if your symptoms last for 3 days. Do not use this medicine to treat a poisoning or overdose. Get emergency help. This medicine may bind to other medicines or dairy products in the stomach. Do not take any other medicines for at least 2 hours before or after taking this medicine. Do not eat or drink milk, cheese, or other diary for at least 2 hours before or after taking this medicine. Herbal or dietary supplements are not regulated like medicines. Rigid quality control standards are not required for dietary supplements. The purity and strength of these products can vary. The safety and effect of this dietary supplement for a certain disease or illness is not well known. This product is not intended to diagnose, treat, cure or prevent any disease. The Food and Drug Administration suggests the following to help consumers protect themselves: Always read product labels and follow directions. Natural does not mean a product is safe for humans to take. Look for products that include USP after the ingredient name. This means that the manufacturer followed the standards of the [place] Pharmacopoeia. Supplements made or sold by  a nationally known food or drug company are more likely to be made under tight controls. You can write to the company for more information about how the product was made. What side effects may I notice  from receiving this medication? Side effects that you should report to your doctor or health care professional as soon as possible: allergic reactions like skin rash, itching or hives, swelling of the face, lips, or tongue Side effects that usually do not require medical attention (report to your doctor or health care professional if they continue or are bothersome): constipation dark stools dark tongue diarrhea or vomiting This list may not describe all possible side effects. Call your doctor for medical advice about side effects. You may report side effects to FDA at 1-800-FDA-1088. Where should I keep my medication? Keep out of the reach of children. Store at room temperature between 15 and 30 degrees C (59 and 86 degrees F). Protect from heat and moisture. Keep tightly closed. Throw away any unused medicine after the expiration date. NOTE: This sheet is a summary. It may not cover all possible information. If you have questions about this medicine, talk to your doctor, pharmacist, or health care provider.  2022 Elsevier/Gold Standard (2019-11-16 00:00:00)

## 2021-06-20 LAB — TISSUE TRANSGLUTAMINASE ABS,IGG,IGA
Tissue Transglut Ab: <2 U/mL (ref 0–5)
Transglutaminase IgA: <2 U/mL (ref 0–3)

## 2021-06-30 ENCOUNTER — Ambulatory Visit: Payer: Self-pay | Admitting: *Deleted

## 2021-06-30 NOTE — Telephone Encounter (Signed)
Reason for Disposition  [1] Continuous (nonstop) coughing interferes with work or school AND [2] no improvement using cough treatment per Care Advice  Answer Assessment - Initial Assessment Questions 1. ONSET: "When did the cough begin?"      1 week- got worse yesterday 2. SEVERITY: "How bad is the cough today?"      Can't sleep at night 3. SPUTUM: "Describe the color of your sputum" (none, dry cough; clear, white, yellow, green)     Not getting up 4. HEMOPTYSIS: "Are you coughing up any blood?" If so ask: "How much?" (flecks, streaks, tablespoons, etc.)     na 5. DIFFICULTY BREATHING: "Are you having difficulty breathing?" If Yes, ask: "How bad is it?" (e.g., mild, moderate, severe)    - MILD: No SOB at rest, mild SOB with walking, speaks normally in sentences, can lie down, no retractions, pulse < 100.    - MODERATE: SOB at rest, SOB with minimal exertion and prefers to sit, cannot lie down flat, speaks in phrases, mild retractions, audible wheezing, pulse 100-120.    - SEVERE: Very SOB at rest, speaks in single words, struggling to breathe, sitting hunched forward, retractions, pulse > 120      No SOB- wheezing at night 6. FEVER: "Do you have a fever?" If Yes, ask: "What is your temperature, how was it measured, and when did it start?"     no 7. CARDIAC HISTORY: "Do you have any history of heart disease?" (e.g., heart attack, congestive heart failure)      no 8. LUNG HISTORY: "Do you have any history of lung disease?"  (e.g., pulmonary embolus, asthma, emphysema)     Asthma 9. PE RISK FACTORS: "Do you have a history of blood clots?" (or: recent major surgery, recent prolonged travel, bedridden)     na 10. OTHER SYMPTOMS: "Do you have any other symptoms?" (e.g., runny nose, wheezing, chest pain)       Wheezing,nasal congestion 11. PREGNANCY: "Is there any chance you are pregnant?" "When was your last menstrual period?"       na 12. TRAVEL: "Have you traveled out of the country in the  last month?" (e.g., travel history, exposures)       no  Protocols used: Cough - Acute Non-Productive-A-AH

## 2021-06-30 NOTE — Telephone Encounter (Signed)
  Chief Complaint: cough- non productive Symptoms: cough, chest congestion Frequency: 1 week Pertinent Negatives: Patient denies neg COVID test Disposition: [] ED /[] Urgent Care (no appt availability in office) / [x] Appointment(In office/virtual)/ []  [facility]/ [] Home Care/ [] Refused Recommended Disposition  Additional Notes: Patient has appointment in am- advised if gets worse do virtual visit tonight

## 2021-07-01 ENCOUNTER — Telehealth (INDEPENDENT_AMBULATORY_CARE_PROVIDER_SITE_OTHER): Payer: Managed Care, Other (non HMO) | Admitting: Internal Medicine

## 2021-07-01 DIAGNOSIS — J069 Acute upper respiratory infection, unspecified: Secondary | ICD-10-CM

## 2021-07-01 MED ORDER — FLUTICASONE PROPIONATE 50 MCG/ACT NA SUSP: 2.0000 | Freq: Every day | NASAL | 6 refills | Status: DC

## 2021-07-01 MED ORDER — BENZONATATE 100 MG PO CAPS
100.0000 mg | ORAL_CAPSULE | Freq: Two times a day (BID) | ORAL | 0 refills | Status: DC | PRN
Start: 2021-07-01 — End: 2021-08-25

## 2021-07-01 MED ORDER — BUDESONIDE 90 MCG/ACT IN AEPB: 1.0000 | INHALATION_SPRAY | Freq: Two times a day (BID) | RESPIRATORY_TRACT | 1 refills | Status: DC

## 2021-07-01 NOTE — Progress Notes (Addendum)
Virtual Visit via Video Note  I connected with Anthony Hale on 07/01/21 at  8:00 AM EST via telephone and verified that I am speaking with the correct person using two identifiers.  Location: Patient: Home   Provider: [facility]   I discussed the limitations of evaluation and management by telephone and the availability of in person appointments. The patient expressed understanding and agreed to proceed.  History of Present Illness:  Anthony Hale is a 35 year old male presenting via telephone for cough x 1 week. Chronic medical conditions include asthma, allergic rhinitis. He uses an albuterol inhaler as needed but usually doesn't use it at all. Started with a dry normal cough x 1 week, yesterday has new nasal congestion, wheeze, metallic taste in his mouth, PND. Home test covid negative 06/30/21.   URI Compliant:  -Worst symptom: cough dry -Fever: no -Cough: yes -Shortness of breath: no -Wheezing: yes -Chest pain: no -Chest tightness: no -Chest congestion: no -Nasal congestion: yes -Runny nose: yes -Post nasal drip: yes -Sneezing: no -Sore throat: no -Swollen glands: no -Sinus pressure: no -Headache: no -Face pain: no -Ear pain: no    -Ear pressure: no    -Vomiting: no -Sick contacts: no -Context: worse -Relief with OTC cold/cough medications: no  -Treatments attempted: cold/sinus and cough syrup   Observations/Objective:  General: no acute distress Pulm: dry cough heard over phone Neuro: answers questions appropriately   Assessment and Plan:  1. Upper respiratory tract infection, unspecified type: Discussed getting a PCR COVID/flu test but he is out of the window for anti-viral treatment. We will treat symptomatically with cough suppressant, nasal steroid and steroid inhaler. Patient will call back if symptoms worsen or fail to improve in 1 week.   - benzonatate (TESSALON) 100 MG capsule; Take 1 capsule (100 mg total) by mouth 2 (two) times daily as needed for cough.   Dispense: 20 capsule; Refill: 0 - Budesonide 90 MCG/ACT inhaler; Inhale 1 puff into the lungs 2 (two) times daily.  Dispense: 1 each; Refill: 1 - fluticasone (FLONASE) 50 MCG/ACT nasal spray; Place 2 sprays into both nostrils daily.  Dispense: 16 g; Refill: 6   Follow Up Instructions: PRN in 1 week if symptoms worsen or fail to improve    I discussed the assessment and treatment plan with the patient. The patient was provided an opportunity to ask questions and all were answered. The patient agreed with the plan and demonstrated an understanding of the instructions.   The patient was advised to call back or seek an in-person evaluation if the symptoms worsen or if the condition fails to improve as anticipated.  I provided 32 minutes of non-face-to-face time during this encounter.   Teodora Medici, DO

## 2021-07-01 NOTE — Patient Instructions (Signed)
It was great seeing you today!  Plan discussed at today's visit: -Use steroid inhaler, nasal steroid and cough suppressant for symptoms -Continue to stay well hydrated and rest  Follow up in: 1 week if symptoms worsen or fail to improve  Take care and let us know if you have any questions or concerns prior to your next visit.  Dr. Rosana Berger

## 2021-07-17 ENCOUNTER — Ambulatory Visit: Payer: 59 | Admitting: Behavioral Health

## 2021-07-25 ENCOUNTER — Other Ambulatory Visit: Payer: Self-pay | Admitting: Internal Medicine

## 2021-07-25 DIAGNOSIS — J069 Acute upper respiratory infection, unspecified: Secondary | ICD-10-CM

## 2021-07-25 NOTE — Telephone Encounter (Signed)
Requested Prescriptions  Pending Prescriptions Disp Refills   PULMICORT FLEXHALER 90 MCG/ACT inhaler [Pharmacy Med Name: PULMICORT 90 [place] 1 each 1    Sig: INHALE 1 PUFF INTO THE LUNGS TWICE A DAY     Pulmonology:  Corticosteroids Passed - 07/25/2021  1:32 PM      Passed - Valid encounter within last 12 months    Recent Outpatient Visits          3 weeks ago Upper respiratory tract infection, unspecified type   [place], DO   1 month ago Well adult exam   [facility] Steele Sizer, MD   6 months ago Acute non-recurrent maxillary sinusitis   [facility] Kathrine Haddock, NP   7 months ago Generalized abdominal pain   [facility] Steele Sizer, MD   10 months ago Discolored semen   [facility] Steele Sizer, MD      Future Appointments            In 1 week Steele Sizer, MD [facility], [place]   In 1 month Brendolyn Patty, MD [place]   In 4 months Steele Sizer, MD [facility], [place]   In 10 months Steele Sizer, MD [facility], [facility]

## 2021-07-30 ENCOUNTER — Encounter: Payer: Self-pay | Admitting: Behavioral Health

## 2021-07-30 ENCOUNTER — Ambulatory Visit: Payer: 59 | Admitting: Behavioral Health

## 2021-07-30 ENCOUNTER — Other Ambulatory Visit: Payer: Self-pay

## 2021-07-30 DIAGNOSIS — F411 Generalized anxiety disorder: Secondary | ICD-10-CM

## 2021-07-30 DIAGNOSIS — F422 Mixed obsessional thoughts and acts: Secondary | ICD-10-CM | POA: Diagnosis not present

## 2021-07-30 DIAGNOSIS — F330 Major depressive disorder, recurrent, mild: Secondary | ICD-10-CM | POA: Diagnosis not present

## 2021-07-30 DIAGNOSIS — G4700 Insomnia, unspecified: Secondary | ICD-10-CM | POA: Diagnosis not present

## 2021-07-30 NOTE — Progress Notes (Signed)
[facility] Med Check  Patient ID: Anthony Hale,  MRN: [id number]  PCP: Steele Sizer, MD  Date of Evaluation: 07/30/2021 Time spent:25 minutes  Chief Complaint:  Chief Complaint   Depression; Anxiety; Follow-up; Medication Refill; Patient Education     HISTORY/CURRENT STATUS: HPI  36 year old male presents to this office for follow up and medication management. He says that he is feeling better since increasing his Viibryd to 40 mg.  Says he has continued to improve mentally and with his anxiety and depression. He says he is still struggles with how his Department handled things with him. Since he was cleared by a psychologist to return to duty, he has been trying to get back to normal at work. Says that he takes his medication as prescribed. For now , he says he does not need any adjustments to his medications. Anxiety today is 3/10 and depression 2/10. 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 He is getting 6-7 hours per night.  He denies mania, no psychosis, no SI/HI.   Past psychiatric medication trials: Wellbutrin Trazodone Prozac    Individual Medical History/ Review of Systems: Changes? :No   Allergies: Patient has no known allergies.  Current Medications:  Current Outpatient Medications:    albuterol (VENTOLIN HFA) 108 (90 Base) MCG/ACT inhaler, Inhale 2 puffs into the lungs every 6 (six) hours as needed for wheezing or shortness of breath., Disp: 18 g, Rfl: 0   benzonatate (TESSALON) 100 MG capsule, Take 1 capsule (100 mg total) by mouth 2 (two) times daily as needed for cough., Disp: 20 capsule, Rfl: 0   fluticasone (FLONASE) 50 MCG/ACT nasal spray, Place 2 sprays into both nostrils daily., Disp: 16 g, Rfl: 6   hyoscyamine (LEVSIN SL) 0.125 MG SL tablet, PLACE 1 TABLET UNDER THE TONGUE 4 (FOUR) TIMES DAILY AS NEEDED., Disp: 60 tablet, Rfl: 0   propranolol (INDERAL) 10 MG tablet, Take 1 tablet (10 mg total) by mouth 3 (three)  times daily., Disp: 90 tablet, Rfl: 3   PULMICORT FLEXHALER 90 MCG/ACT inhaler, INHALE 1 PUFF INTO THE LUNGS TWICE A DAY, Disp: 1 each, Rfl: 0   traZODone (DESYREL) 50 MG tablet, TAKE 1 TABLET BY MOUTH EVERYDAY AT BEDTIME, Disp: 90 tablet, Rfl: 2   Vilazodone HCl (VIIBRYD) 40 MG TABS, Take 1 tablet (40 mg total) by mouth daily., Disp: 30 tablet, Rfl: 3 Medication Side Effects: none  Family Medical/ Social History: Changes? No  MENTAL HEALTH EXAM:  There were no vitals taken for this visit.There is no height or weight on file to calculate BMI.  General Appearance: Casual and Neat  Eye Contact:  Good  Speech:  Clear and Coherent  Volume:  Normal  Mood:  NA  Affect:  Appropriate  Thought Process:  Coherent  Orientation:  Full (Time, Place, and Person)  Thought Content: Logical   Suicidal Thoughts:  No  Homicidal Thoughts:  No  Memory:  WNL  Judgement:  Good  Insight:  Good  Psychomotor Activity:  Normal  Concentration:  Concentration: Good  Recall:  Good  Fund of Knowledge: Good  Language: Good  Assets:  Desire for Improvement  ADL's:  Intact  Cognition: WNL  Prognosis:  Good    DIAGNOSES:    ICD-10-CM   1. Generalized anxiety disorder  F41.1     2. Mixed obsessional thoughts and acts  F42.2     3. Insomnia, unspecified type  G47.00     4.  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  To report any side effects or worsening symptoms promptly Will follow up in 3 months to reassess Reinforced taking medications exactly as prescribed and reviewed good sleep hygiene.  Provided emergency contact information Greater than 50% of 30 min face to face time with patient was spent on counseling and coordination of care.  Discussed his improvements with compulsive behaviors such as returning to parking lots or stop light to check if he had struck another car or object. He still struggles occasionally with some of the trauma he has seen. He would like to move to daytime hours if possible.  Reviewed PDMP    Elwanda [place], NP

## 2021-08-05 ENCOUNTER — Ambulatory Visit: Payer: Managed Care, Other (non HMO) | Admitting: Family Medicine

## 2021-08-11 ENCOUNTER — Other Ambulatory Visit: Payer: Self-pay | Admitting: Family Medicine

## 2021-08-11 DIAGNOSIS — J069 Acute upper respiratory infection, unspecified: Secondary | ICD-10-CM

## 2021-08-19 ENCOUNTER — Ambulatory Visit: Payer: 59 | Admitting: Behavioral Health

## 2021-08-22 NOTE — Progress Notes (Signed)
Name: Anthony Hale   MRN: [id number]    DOB: 1985-10-30   Date:08/25/2021       Progress Note  Subjective  Chief Complaint  Follow Up  HPI  Abdominal cramping and diarrhea: he states he has this episodes intermittently since childhood.  I looked through my notes and did not see any notes about it. He states he had some tests as a child and was given a diagnoses of IBS as a young adult. He has intermittent symptoms but nothing lately   OCD: he states that over the past 6 years he has noticed intrusive thoughts, has to double check doors ,making sure the alarm is set before going to bed multiple times. He is getting worried about hitting someone while driving, sometimes drives back to make sure he has not hit anyone.  He needs to count to 4-6 times- it must be an even number -  when double checking anything ( like the alarm, car  door - needs to be an even number)  He needs to organized everything in alphabetical order, books on a shelf has to be from large size to smaller size. He organizes his clothes by color. He gets irritated when his own  things are not organized.He states lately not driving back to double check if he has hit someone or something.    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 He was doing well , getting prescriptions from [facility], however higher dose caused nausea and vomiting, he will go back for follow up in 2 days and will discussed with Dr. Dema Severin. He also has therapy at [facility].   Dyslipidemia, low HDL:   No family history of sudden death or heart disease before age 60 for males or pre-menopausal for females. Last LDL was high at 151 . He has been more active, going to the gym 5 days a week, working from 90 minutes to 120 minutes per daily    Cough variant asthma: he has a long  history of cough variant asthma. . We gave his pulmicort when symptoms were frequent, however doing better now, occasionally uses albuterol. Could not figure out how to use the [employer].   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   Decorative tattoo 12/31/2014   Asthma, cough variant 12/30/2014   Insomnia 12/30/2014   GERD (gastroesophageal reflux disease) 12/30/2014   Perennial allergic rhinitis with seasonal variation 05/23/2007    Past Surgical History:  Procedure Laterality Date   WISDOM TOOTH EXTRACTION      Family History  Problem Relation Age of Onset   Cancer Mother        Breast   Hyperlipidemia Mother    Heart disease Father    Hyperlipidemia Father    Hypertension Father     Social History   Tobacco Use   Smoking status: Never   Smokeless tobacco: Never  Substance Use Topics   Alcohol use: Yes    Alcohol/week: 0.0 standard drinks  Comment: rarely     Current Outpatient Medications:    albuterol (VENTOLIN HFA) 108 (90 Base) MCG/ACT inhaler, Inhale 2 puffs into the lungs every 6 (six) hours as needed for wheezing or shortness of breath., Disp: 18 g, Rfl: 0   fluticasone (FLONASE) 50 MCG/ACT nasal spray, Place 2 sprays into both nostrils daily., Disp: 16 g, Rfl: 6   hyoscyamine (LEVSIN SL) 0.125 MG SL tablet, PLACE 1 TABLET UNDER THE TONGUE 4 (FOUR) TIMES DAILY AS NEEDED., Disp: 60 tablet, Rfl: 0   propranolol (INDERAL) 10 MG tablet, Take 1 tablet (10 mg total) by mouth 3 (three) times daily., Disp: 90 tablet, Rfl: 3   traZODone (DESYREL) 50 MG tablet, TAKE 1 TABLET BY MOUTH EVERYDAY AT BEDTIME, Disp: 90 tablet, Rfl: 2    Vilazodone HCl (VIIBRYD) 40 MG TABS, Take 1 tablet (40 mg total) by mouth daily., Disp: 30 tablet, Rfl: 3  No Known Allergies  I personally reviewed active problem list, medication list, allergies, family history, social history, health maintenance with the patient/caregiver today.   ROS  Constitutional: Negative for fever or weight change.  Respiratory: Negative for cough and shortness of breath.   Cardiovascular: Negative for chest pain or palpitations.  Gastrointestinal: Negative for abdominal pain, no bowel changes.  Musculoskeletal: Negative for gait problem or joint swelling.  Skin: Negative for rash.  Neurological: Negative for dizziness or headache.  No other specific complaints in a complete review of systems (except as listed in HPI above).   Objective  Vitals:   08/25/21 1351  BP: 126/78  Pulse: 93  Resp: 16  Temp: 98.3 F (36.8 C)  SpO2: 98%  Weight: 179 lb 12.8 oz (81.6 kg)  Height: _0  (1.651 m)    Body mass index is 29.92 kg/m.  Physical Exam  Constitutional: Patient appears well-developed and well-nourished.  No distress.  HEENT: head atraumatic, normocephalic, pupils equal and reactive to light,  neck supple Cardiovascular: Normal rate, regular rhythm and normal heart sounds.  No murmur heard. No BLE edema. Pulmonary/Chest: Effort normal and breath sounds normal. No respiratory distress. Abdominal: Soft.  There is no tenderness. Psychiatric: Patient has a normal mood and affect. behavior is normal. Judgment and thought content normal.   Recent Results (from the past 2160 hour(s))  Lipid panel     Status: Abnormal   Collection Time: 06/03/21  9:10 AM  Result Value Ref Range   Cholesterol 205 (H) <200 mg/dL   HDL 38 (L) > OR = 40 mg/dL   Triglycerides 135 <150 mg/dL   LDL Cholesterol (Calc) 141 (H) mg/dL (calc)    Comment: Reference range: <100 . Desirable range <100 mg/dL for primary prevention;   <70 mg/dL for patients with CHD or diabetic  patients  with > or = 2 CHD risk factors. Marland Kitchen LDL-C is now calculated using the Martin-Hopkins  calculation, which is a validated novel method providing  better accuracy than the Friedewald equation in the  estimation of LDL-C.  Cresenciano Genre et al. Annamaria Helling. 1749;449(67): 2061-2068  (http://education.[employer].com/faq/FAQ164)    Total CHOL/HDL Ratio 5.4 (H) <5.0 (calc)   Non-HDL Cholesterol (Calc) 167 (H) <130 mg/dL (calc)    Comment: For patients with diabetes plus 1 major ASCVD risk  factor, treating to a non-HDL-C goal of <100 mg/dL  (LDL-C of <70 mg/dL) is considered a therapeutic  option.   CBC with Differential/Platelet     Status: None   Collection Time: 06/03/21  9:10 AM  Result Value Ref Range  WBC 6.2 3.8 - 10.8 Thousand/uL   RBC 5.14 4.20 - 5.80 Million/uL   Hemoglobin 15.6 13.2 - 17.1 g/dL   HCT 46.1 38.5 - 50.0 %   MCV 89.7 80.0 - 100.0 fL   MCH 30.4 27.0 - 33.0 pg   MCHC 33.8 32.0 - 36.0 g/dL   RDW 12.6 11.0 - 15.0 %   Platelets 257 140 - 400 Thousand/uL   MPV 10.0 7.5 - 12.5 fL   Neutro Abs 3,956 1,500 - 7,800 cells/uL   Lymphs Abs 1,550 850 - 3,900 cells/uL   Absolute Monocytes 533 200 - 950 cells/uL   Eosinophils Absolute 112 15 - 500 cells/uL   Basophils Absolute 50 0 - 200 cells/uL   Neutrophils Relative % 63.8 %   Total Lymphocyte 25.0 %   Monocytes Relative 8.6 %   Eosinophils Relative 1.8 %   Basophils Relative 0.8 %  COMPLETE METABOLIC PANEL WITH GFR     Status: Abnormal   Collection Time: 06/03/21  9:10 AM  Result Value Ref Range   Glucose, Bld 95 65 - 99 mg/dL    Comment: .            Fasting reference interval .    BUN 15 7 - 25 mg/dL   Creat 1.23 0.60 - 1.26 mg/dL   eGFR 78 > OR = 60 mL/min/1.18m    Comment: The eGFR is based on the CKD-EPI 2021 equation. To calculate  the new eGFR from a previous Creatinine or Cystatin C result, go to https://www.kidney.org/professionals/ kdoqi/gfr%5Fcalculator    BUN/Creatinine Ratio NOT APPLICABLE  6 - 22 (calc)   Sodium 141 135 - 146 mmol/L   Potassium 4.3 3.5 - 5.3 mmol/L   Chloride 104 98 - 110 mmol/L   CO2 30 20 - 32 mmol/L   Calcium 9.5 8.6 - 10.3 mg/dL   Total Protein 7.0 6.1 - 8.1 g/dL   Albumin 4.7 3.6 - 5.1 g/dL   Globulin 2.3 1.9 - 3.7 g/dL (calc)   AG Ratio 2.0 1.0 - 2.5 (calc)   Total Bilirubin 0.6 0.2 - 1.2 mg/dL   Alkaline phosphatase (APISO) 71 36 - 130 U/L   AST 22 10 - 40 U/L   ALT 51 (H) 9 - 46 U/L  TSH     Status: None   Collection Time: 06/03/21  9:10 AM  Result Value Ref Range   TSH 1.47 0.40 - 4.50 mIU/L  Hemoglobin A1c     Status: None   Collection Time: 06/03/21  9:10 AM  Result Value Ref Range   Hgb A1c MFr Bld 5.4 <5.7 % of total Hgb    Comment: For the purpose of screening for the presence of diabetes: . <5.7%       Consistent with the absence of diabetes 5.7-6.4%    Consistent with increased risk for diabetes             (prediabetes) > or =6.5%  Consistent with diabetes . This assay result is consistent with a decreased risk of diabetes. . Currently, no consensus exists regarding use of hemoglobin A1c for diagnosis of diabetes in children. . According to American Diabetes Association (ADA) guidelines, hemoglobin A1c <7.0% represents optimal control in non-pregnant diabetic patients. Different metrics may apply to specific patient populations.  Standards of Medical Care in Diabetes(ADA). .    Mean Plasma Glucose 108 mg/dL   eAG (mmol/L) 6.0 mmol/L  Tissue Transglutaminase Abs,IgG,IgA     Status: None   Collection Time: 06/18/21  10:19 AM  Result Value Ref Range   Transglutaminase IgA <2 0 - 3 U/mL    Comment:                               Negative        0 -  3                               Weak Positive   4 - 10                               Positive           >10  Tissue Transglutaminase (tTG) has been identified  as the endomysial antigen.  Studies have demonstr-  ated that endomysial IgA antibodies have over 99%  specificity  for gluten sensitive enteropathy.    Tissue Transglut Ab <2 0 - 5 U/mL    Comment:                               Negative        0 - 5                               Weak Positive   6 - 9                               Positive           >9     PHQ2/9: Depression screen [facility] 2/9 08/25/2021 07/01/2021 06/03/2021 01/07/2021 12/05/2020  Decreased Interest 0 0 0 0 0  Down, Depressed, Hopeless 1 0 1 0 1  PHQ - 2 Score 1 0 1 0 1  Altered sleeping 1 0 3 0 3  Tired, decreased energy 1 0 3 0 3  Change in appetite 0 0 0 0 0  Feeling bad or failure about yourself  0 0 0 0 0  Trouble concentrating 0 0 0 0 3  Moving slowly or fidgety/restless 0 0 0 0 0  Suicidal thoughts 0 0 0 0 0  PHQ-9 Score 3 0 7 0 10  Difficult doing work/chores Not difficult at all Not difficult at all - Not difficult at all -  Some encounter information is confidential and restricted. Go to Review Flowsheets activity to see all data.  Some recent data might be hidden    phq 9 is positive   Fall Risk: Fall Risk  08/25/2021 07/01/2021 06/03/2021 01/07/2021 12/05/2020  Falls in the past year? 1 0 0 0 0  Number falls in past yr: 0 0 0 0 0  Injury with Fall? 0 0 0 0 0  Risk for fall due to : - No Fall Risks No Fall Risks - -  Follow up - Falls prevention discussed Falls prevention discussed Falls evaluation completed -      Functional Status Survey: Is the patient deaf or have difficulty hearing?: No Does the patient have difficulty seeing, even when wearing glasses/contacts?: No Does the patient have difficulty concentrating, remembering, or making decisions?: Yes Does the patient have difficulty walking or climbing stairs?: No Does the patient have difficulty dressing or  bathing?: No Does the patient have difficulty doing errands alone such as visiting a doctor's office or shopping?: No    Assessment & Plan  1. Mild recurrent major depression ([place])  Improving   2. Dyslipidemia   3. Gastroesophageal reflux  disease without esophagitis  Controlled  4. Asthma, cough variant  Discussed other medications but he states he is fine at this time   5. Obsessive-compulsive disorder, unspecified type   Keep follow up with [facility]   6. Elevated liver enzymes  - Hepatic function panel  7. Routine screening for STI (sexually transmitted infection)  - HIV Antibody (routine testing w rflx) - RPR

## 2021-08-25 ENCOUNTER — Encounter: Payer: Self-pay | Admitting: Family Medicine

## 2021-08-25 ENCOUNTER — Ambulatory Visit: Payer: Managed Care, Other (non HMO) | Admitting: Family Medicine

## 2021-08-25 VITALS — BP 126/78 | HR 93 | Temp 98.3°F | Resp 16 | Ht 65.0 in | Wt 179.8 lb

## 2021-08-25 DIAGNOSIS — R748 Abnormal levels of other serum enzymes: Secondary | ICD-10-CM

## 2021-08-25 DIAGNOSIS — F330 Major depressive disorder, recurrent, mild: Secondary | ICD-10-CM

## 2021-08-25 DIAGNOSIS — K219 Gastro-esophageal reflux disease without esophagitis: Secondary | ICD-10-CM

## 2021-08-25 DIAGNOSIS — J45991 Cough variant asthma: Secondary | ICD-10-CM | POA: Diagnosis not present

## 2021-08-25 DIAGNOSIS — F429 Obsessive-compulsive disorder, unspecified: Secondary | ICD-10-CM

## 2021-08-25 DIAGNOSIS — E785 Hyperlipidemia, unspecified: Secondary | ICD-10-CM

## 2021-08-25 DIAGNOSIS — Z113 Encounter for screening for infections with a predominantly sexual mode of transmission: Secondary | ICD-10-CM

## 2021-08-26 ENCOUNTER — Ambulatory Visit: Payer: Managed Care, Other (non HMO) | Admitting: Dermatology

## 2021-08-26 LAB — HIV ANTIBODY (ROUTINE TESTING W REFLEX): HIV 1&2 Ab, 4th Generation: NONREACTIVE

## 2021-08-26 LAB — HEPATIC FUNCTION PANEL
AG Ratio: 2 (calc) (ref 1.0–2.5)
ALT: 37 U/L (ref 9–46)
AST: 17 U/L (ref 10–40)
Albumin: 4.6 g/dL (ref 3.6–5.1)
Alkaline phosphatase (APISO): 65 U/L (ref 36–130)
Bilirubin, Direct: 0.1 mg/dL (ref 0.0–0.2)
Globulin: 2.3 g/dL (calc) (ref 1.9–3.7)
Indirect Bilirubin: 0.5 mg/dL (calc) (ref 0.2–1.2)
Total Bilirubin: 0.6 mg/dL (ref 0.2–1.2)
Total Protein: 6.9 g/dL (ref 6.1–8.1)

## 2021-08-26 LAB — RPR: RPR Ser Ql: NONREACTIVE

## 2021-08-27 ENCOUNTER — Ambulatory Visit: Payer: 59 | Admitting: Behavioral Health

## 2021-08-29 ENCOUNTER — Other Ambulatory Visit: Payer: Self-pay

## 2021-08-29 ENCOUNTER — Ambulatory Visit: Payer: 59 | Admitting: Behavioral Health

## 2021-08-29 ENCOUNTER — Encounter: Payer: Self-pay | Admitting: Behavioral Health

## 2021-08-29 DIAGNOSIS — G4700 Insomnia, unspecified: Secondary | ICD-10-CM

## 2021-08-29 DIAGNOSIS — F330 Major depressive disorder, recurrent, mild: Secondary | ICD-10-CM

## 2021-08-29 DIAGNOSIS — F422 Mixed obsessional thoughts and acts: Secondary | ICD-10-CM

## 2021-08-29 DIAGNOSIS — F411 Generalized anxiety disorder: Secondary | ICD-10-CM

## 2021-08-29 MED ORDER — VILAZODONE HCL 20 MG PO TABS: 20.0000 mg | ORAL_TABLET | Freq: Every day | ORAL | 1 refills | Status: DC
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
## 2021-09-23 ENCOUNTER — Ambulatory Visit: Payer: Managed Care, Other (non HMO) | Admitting: Dermatology

## 2021-09-26 ENCOUNTER — Ambulatory Visit: Payer: 59 | Admitting: Behavioral Health

## 2021-10-21 ENCOUNTER — Encounter: Payer: Self-pay | Admitting: Family Medicine

## 2021-10-21 ENCOUNTER — Other Ambulatory Visit (HOSPITAL_COMMUNITY)
Admission: RE | Admit: 2021-10-21 | Discharge: 2021-10-21 | Disposition: A | Discharge status: Home / Self Care | Payer: Managed Care, Other (non HMO) | Source: Ambulatory Visit | Attending: Family Medicine | Admitting: Family Medicine

## 2021-10-21 ENCOUNTER — Ambulatory Visit: Payer: Managed Care, Other (non HMO) | Admitting: Family Medicine

## 2021-10-21 VITALS — BP 102/64 | HR 65 | Temp 98.4°F | Resp 16 | Ht 65.0 in | Wt 181.1 lb

## 2021-10-21 DIAGNOSIS — F330 Major depressive disorder, recurrent, mild: Secondary | ICD-10-CM | POA: Diagnosis not present

## 2021-10-21 DIAGNOSIS — Z1159 Encounter for screening for other viral diseases: Secondary | ICD-10-CM | POA: Diagnosis not present

## 2021-10-21 DIAGNOSIS — F411 Generalized anxiety disorder: Secondary | ICD-10-CM

## 2021-10-21 DIAGNOSIS — Z202 Contact with and (suspected) exposure to infections with a predominantly sexual mode of transmission: Secondary | ICD-10-CM | POA: Diagnosis present

## 2021-10-21 NOTE — Progress Notes (Signed)
? ?  SUBJECTIVE:  ? ?CHIEF COMPLAINT / HPI:  ? ?STI concerns ?- concern for possible exposure to STI with previous girlfriend. Last sex with this partner Dec/Jan. ?- recent new partner, using condoms ?- denies rashes, ulcers, penile discharge. ? ?Premature ejaculation  ?- reports premature ejaculation within few minutes of sexual activity. No troubles getting erection. Has been going on since started having relationship trouble with previous girlfriend.  ?- no h/o diabetes, last A1c 5.4. ?- non smoker ?- h/o MDD, OCD. On Vilazodone with recent dose decrease 2/10 due to nausea, vomiting. Sees Lesle Chris, NP.  Getting therapy at [facility]. 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  ? ? ?OBJECTIVE:  ? ?BP 102/64   Pulse 65   Temp 98.4 ?F (36.9 ?C) (Oral)   Resp 16   Ht '5\' 5"'$  (1.651 m)   Wt 181 lb 1.6 oz (82.1 kg)   SpO2 98%   BMI 30.14 kg/m?   ?Gen: well appearing, in NAD ?Card: reg rate ?Lungs: Comfortable WOB on RA ?Ext: WWP ? ? ?ASSESSMENT/PLAN:  ? ?Problem List Items Addressed This Visit   ? ?  ? Other  ? Depression, major, recurrent, mild ([place])  ?  Likely contributing to sexual dysfunction as above, especially given recent relationship trouble. Counseled on managing stress, continue counseling. If symptoms continue despite these, consider adjusting SSRI. ?  ?  ? Generalized anxiety disorder  ? ?Other Visit Diagnoses   ? ? Need for hepatitis C screening test    -  Primary  ? Relevant Orders  ? Hepatitis C antibody  ? Exposure to STD      ? Relevant Orders  ? Hepatitis C antibody  ? Urine cytology ancillary only  ? ?  ? ? ? ? ?Myles Gip, DO ?

## 2021-10-21 NOTE — Assessment & Plan Note (Signed)
Likely contributing to sexual dysfunction as above, especially given recent relationship trouble. Counseled on managing stress, continue counseling. If symptoms continue despite these, consider adjusting SSRI. ?

## 2021-10-22 LAB — URINE CYTOLOGY ANCILLARY ONLY
Chlamydia: NEGATIVE
Comment: NEGATIVE
Comment: NEGATIVE
Comment: NORMAL
Neisseria Gonorrhea: NEGATIVE
Trichomonas: NEGATIVE

## 2021-10-22 LAB — HEPATITIS C ANTIBODY
Hepatitis C Ab: NONREACTIVE
SIGNAL TO CUT-OFF: 0.07 (ref <1.00)

## 2021-10-29 ENCOUNTER — Ambulatory Visit: Payer: 59 | Admitting: Behavioral Health

## 2021-11-03 ENCOUNTER — Ambulatory Visit (INDEPENDENT_AMBULATORY_CARE_PROVIDER_SITE_OTHER): Payer: Self-pay | Admitting: Behavioral Health

## 2021-11-03 DIAGNOSIS — F330 Major depressive disorder, recurrent, mild: Secondary | ICD-10-CM

## 2021-11-03 DIAGNOSIS — F411 Generalized anxiety disorder: Secondary | ICD-10-CM

## 2021-11-03 DIAGNOSIS — G4700 Insomnia, unspecified: Secondary | ICD-10-CM

## 2021-11-03 NOTE — Progress Notes (Signed)
Pt was no show. Did not notify office within 24 hrs. ?

## 2021-11-26 ENCOUNTER — Other Ambulatory Visit: Payer: Self-pay | Admitting: Behavioral Health

## 2021-11-26 DIAGNOSIS — F422 Mixed obsessional thoughts and acts: Secondary | ICD-10-CM

## 2021-11-26 DIAGNOSIS — F411 Generalized anxiety disorder: Secondary | ICD-10-CM

## 2021-11-26 NOTE — Telephone Encounter (Signed)
Pt is currently headed out of town.  He needs [facility] today.  [facility] [facility], [place] [id number] ? ?Earlier today he went to [facility] at [facility] [employer] in [place] and the pharmacist told him he had to get a 90 day supply for his insurance to pay.  He said he only wants a 30 day supply.  ? ?He wants someone to send a 30-day script to the [facility] in [place]. ?

## 2021-11-26 NOTE — Telephone Encounter (Signed)
Call patient. If he wants only 30 days will have to pay out of pocket.  ?

## 2021-11-27 NOTE — Telephone Encounter (Signed)
Patient requested that a 90-day refill be sent to his regular pharmacy in [place]. Refill request was approved for 90 days.  ?

## 2021-11-27 NOTE — Telephone Encounter (Signed)
Tried to call patient. Unable to hear him. Called back and left VM.  ?

## 2021-12-02 ENCOUNTER — Ambulatory Visit: Payer: Managed Care, Other (non HMO) | Admitting: Family Medicine

## 2021-12-02 ENCOUNTER — Ambulatory Visit: Payer: 59 | Admitting: Behavioral Health

## 2021-12-16 ENCOUNTER — Ambulatory Visit: Payer: 59 | Admitting: Behavioral Health

## 2021-12-16 ENCOUNTER — Encounter: Payer: Self-pay | Admitting: Behavioral Health

## 2021-12-16 DIAGNOSIS — F411 Generalized anxiety disorder: Secondary | ICD-10-CM

## 2021-12-16 DIAGNOSIS — G4700 Insomnia, unspecified: Secondary | ICD-10-CM | POA: Diagnosis not present

## 2021-12-16 DIAGNOSIS — F422 Mixed obsessional thoughts and acts: Secondary | ICD-10-CM | POA: Diagnosis not present

## 2021-12-16 DIAGNOSIS — F330 Major depressive disorder, recurrent, mild: Secondary | ICD-10-CM | POA: Diagnosis not present

## 2021-12-16 NOTE — Progress Notes (Signed)
[facility]  Patient ID: Anthony Hale,  MRN: [id number]  PCP: Steele Sizer, MD  Date of Evaluation: 12/16/2021 Time spent:30 minutes  Chief Complaint:  Chief Complaint   Anxiety; Depression; Follow-up; Medication Refill     HISTORY/CURRENT STATUS: HPI  36 year old male presents to this office for follow up and medication management. Says he has been doing well overall. His work environment has improved a little bit. Says that he takes his medication as prescribed.He does not want to adjust his medications at this time.  Anxiety today is 3/10 and depression 2/10. 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 He is getting 6-7 hours per night.  He denies mania, no psychosis, no SI/HI.   Past psychiatric medication trials: Wellbutrin Trazodone Prozac  Individual Medical History/ Review of Systems: Changes? :No   Allergies: Patient has no known allergies.  Current Medications:  Current Outpatient Medications:    Vilazodone HCl 20 MG TABS, TAKE 1 TABLET BY MOUTH EVERY DAY, Disp: 90 tablet, Rfl: 0   albuterol (VENTOLIN HFA) 108 (90 Base) MCG/ACT inhaler, Inhale 2 puffs into the lungs every 6 (six) hours as needed for wheezing or shortness of breath., Disp: 18 g, Rfl: 0   fluticasone (FLONASE) 50 MCG/ACT nasal spray, Place 2 sprays into both nostrils daily., Disp: 16 g, Rfl: 6   hyoscyamine (LEVSIN SL) 0.125 MG SL tablet, PLACE 1 TABLET UNDER THE TONGUE 4 (FOUR) TIMES DAILY AS NEEDED., Disp: 60 tablet, Rfl: 0   propranolol (INDERAL) 10 MG tablet, Take 1 tablet (10 mg total) by mouth 3 (three) times daily., Disp: 90 tablet, Rfl: 3   traZODone (DESYREL) 50 MG tablet, TAKE 1 TABLET BY MOUTH EVERYDAY AT BEDTIME, Disp: 90 tablet, Rfl: 2 Medication Side Effects: none  Family Medical/ Social History: Changes? No  MENTAL HEALTH EXAM:  There were no vitals taken for this visit.There is no height or weight on file to calculate BMI.   General Appearance: Casual, Neat, and Well Groomed  Eye Contact:  Good  Speech:  Clear and Coherent  Volume:  Normal  Mood:  NA  Affect:  Appropriate  Thought Process:  Coherent  Orientation:  Full (Time, Place, and Person)  Thought Content: Logical   Suicidal Thoughts:  No  Homicidal Thoughts:  No  Memory:  WNL  Judgement:  Good  Insight:  Good  Psychomotor Activity:  Normal  Concentration:  Concentration: Good  Recall:  Good  Fund of Knowledge: Good  Language: Good  Assets:  Desire for Improvement  ADL's:  Intact  Cognition: WNL  Prognosis:  Good    DIAGNOSES: No diagnosis found.  Receiving Psychotherapy: No   RECOMMENDATIONS:   Reduce Viibryd to 20 mg daily.  To continue Trazodone 50 mg for sleep. 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  To report any side effects or worsening symptoms promptly Will follow up in 3 months to reassess Reinforced taking medications exactly as prescribed and reviewed good sleep hygiene.  Provided emergency contact information Greater than 50% of 30 min face to face time with patient was spent on counseling and coordination of care. He continues to be very stable with Viibryd 20 mg. He says his work situation has been slowly improving.  Reviewed PDMP        Elwanda Brooklyn, NP

## 2021-12-31 ENCOUNTER — Ambulatory Visit: Payer: Managed Care, Other (non HMO) | Admitting: Family Medicine

## 2021-12-31 ENCOUNTER — Other Ambulatory Visit: Payer: Self-pay

## 2021-12-31 ENCOUNTER — Encounter: Payer: Self-pay | Admitting: Family Medicine

## 2021-12-31 VITALS — BP 108/72 | HR 67 | Temp 98.3°F | Resp 16 | Ht 65.0 in | Wt 185.2 lb

## 2021-12-31 DIAGNOSIS — Z7721 Contact with and (suspected) exposure to potentially hazardous body fluids: Secondary | ICD-10-CM | POA: Diagnosis not present
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
## 2022-01-01 LAB — COMPREHENSIVE METABOLIC PANEL
AG Ratio: 1.9 (calc) (ref 1.0–2.5)
ALT: 39 U/L (ref 9–46)
AST: 18 U/L (ref 10–40)
Albumin: 4.6 g/dL (ref 3.6–5.1)
Alkaline phosphatase (APISO): 72 U/L (ref 36–130)
BUN: 10 mg/dL (ref 7–25)
CO2: 30 mmol/L (ref 20–32)
Calcium: 10 mg/dL (ref 8.6–10.3)
Chloride: 104 mmol/L (ref 98–110)
Creat: 1.06 mg/dL (ref 0.60–1.26)
Globulin: 2.4 g/dL (calc) (ref 1.9–3.7)
Glucose, Bld: 97 mg/dL (ref 65–99)
Potassium: 4.7 mmol/L (ref 3.5–5.3)
Sodium: 141 mmol/L (ref 135–146)
Total Bilirubin: 0.7 mg/dL (ref 0.2–1.2)
Total Protein: 7 g/dL (ref 6.1–8.1)

## 2022-01-01 LAB — CBC WITH DIFFERENTIAL/PLATELET
Absolute Monocytes: 504 cells/uL (ref 200–950)
Basophils Absolute: 39 cells/uL (ref 0–200)
Basophils Relative: 0.7 %
Eosinophils Absolute: 90 cells/uL (ref 15–500)
Eosinophils Relative: 1.6 %
HCT: 48.6 % (ref 38.5–50.0)
Hemoglobin: 16.2 g/dL (ref 13.2–17.1)
Lymphs Abs: 1187 cells/uL (ref 850–3900)
MCH: 29.8 pg (ref 27.0–33.0)
MCHC: 33.3 g/dL (ref 32.0–36.0)
MCV: 89.5 fL (ref 80.0–100.0)
MPV: 9.8 fL (ref 7.5–12.5)
Monocytes Relative: 9 %
Neutro Abs: 3780 cells/uL (ref 1500–7800)
Neutrophils Relative %: 67.5 %
Platelets: 271 10*3/uL (ref 140–400)
RBC: 5.43 10*6/uL (ref 4.20–5.80)
RDW: 12.5 % (ref 11.0–15.0)
Total Lymphocyte: 21.2 %
WBC: 5.6 10*3/uL (ref 3.8–10.8)

## 2022-01-01 LAB — HEPATITIS B SURFACE ANTIBODY,QUALITATIVE: Hep B S Ab: REACTIVE — AB

## 2022-01-01 LAB — HEPATITIS C ANTIBODY
Hepatitis C Ab: NONREACTIVE
SIGNAL TO CUT-OFF: 0.12

## 2022-01-01 LAB — HEPATITIS B CORE ANTIBODY, IGM: Hep B C IgM: NONREACTIVE

## 2022-01-01 LAB — HIV ANTIBODY (ROUTINE TESTING W REFLEX): HIV 1&2 Ab, 4th Generation: NONREACTIVE

## 2022-01-01 LAB — HEPATITIS B SURFACE ANTIGEN: Hepatitis B Surface Ag: NONREACTIVE

## 2022-01-02 ENCOUNTER — Encounter: Payer: Self-pay | Admitting: Family Medicine

## 2022-01-02 ENCOUNTER — Telehealth: Payer: Self-pay | Admitting: Family Medicine

## 2022-01-02 NOTE — Telephone Encounter (Unsigned)
Copied from [place] [phone]. Topic: General - Other >> Jan 02, 2022  2:08 PM Marcellus Scott wrote: Reason for CRM: Pt requesting a call back to discuss labs.

## 2022-01-05 NOTE — Telephone Encounter (Signed)
Spoke to patient via phone, answered questions.

## 2022-02-26 ENCOUNTER — Other Ambulatory Visit: Payer: Self-pay | Admitting: Behavioral Health

## 2022-02-26 DIAGNOSIS — F422 Mixed obsessional thoughts and acts: Secondary | ICD-10-CM

## 2022-02-26 DIAGNOSIS — F411 Generalized anxiety disorder: Secondary | ICD-10-CM

## 2022-03-02 ENCOUNTER — Ambulatory Visit: Payer: Managed Care, Other (non HMO) | Admitting: Family Medicine

## 2022-03-12 ENCOUNTER — Ambulatory Visit: Payer: Managed Care, Other (non HMO) | Admitting: Family Medicine

## 2022-03-12 ENCOUNTER — Encounter: Payer: Self-pay | Admitting: Family Medicine

## 2022-03-12 VITALS — BP 102/60 | HR 94 | Temp 98.7°F | Resp 16 | Ht 65.0 in | Wt 177.0 lb

## 2022-03-12 DIAGNOSIS — R051 Acute cough: Secondary | ICD-10-CM | POA: Diagnosis not present

## 2022-03-12 DIAGNOSIS — R110 Nausea: Secondary | ICD-10-CM | POA: Diagnosis not present

## 2022-03-12 DIAGNOSIS — J029 Acute pharyngitis, unspecified: Secondary | ICD-10-CM | POA: Diagnosis not present

## 2022-03-12 DIAGNOSIS — J069 Acute upper respiratory infection, unspecified: Secondary | ICD-10-CM | POA: Diagnosis not present

## 2022-03-12 LAB — POCT RAPID STREP A (OFFICE): Rapid Strep A Screen: NEGATIVE

## 2022-03-12 MED ORDER — BENZONATATE 100 MG PO CAPS: 100.0000 mg | ORAL_CAPSULE | Freq: Two times a day (BID) | ORAL | 0 refills | Status: DC | PRN

## 2022-03-12 MED ORDER — ONDANSETRON HCL 4 MG PO TABS: 4.0000 mg | ORAL_TABLET | Freq: Three times a day (TID) | ORAL | 0 refills | Status: DC | PRN

## 2022-03-12 NOTE — Progress Notes (Signed)
Name: Anthony Hale   MRN: [id number]    DOB: 1986-01-06   Date:03/12/2022       Progress Note  Subjective  Chief Complaint  Sore Throat  HPI  URI: he states he woke up two days ago not feeling well. He woke up with rhinorrhea , low grade fever 100.3-100.9 and chills, followed by headache, dry cough, sore throat, body aches, and decrease in appetite. Otc covid test was negative, today strep test negative. He went to work last night, he states feeling a little better today but is drained   Last time he used his inhaler was 3 days ago that resolved when he used rescue inhaler.   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/31/2014   Decorative tattoo 12/31/2014   Asthma, cough variant 12/30/2014   Insomnia 12/30/2014   GERD (gastroesophageal reflux disease) 12/30/2014   Perennial allergic rhinitis with seasonal variation 05/23/2007    Past Surgical History:  Procedure Laterality Date   WISDOM TOOTH EXTRACTION      Family History  Problem Relation Age of Onset   Cancer Mother        Breast   Hyperlipidemia Mother    Heart disease Father    Hyperlipidemia Father    Hypertension Father     Social History   Tobacco Use   Smoking status: Never   Smokeless tobacco: Never  Substance Use Topics   Alcohol use: Yes    Alcohol/week: 0.0 standard drinks of alcohol    Comment: rarely     Current Outpatient Medications:    albuterol (VENTOLIN HFA) 108 (90 Base) MCG/ACT inhaler, Inhale 2 puffs into the lungs every 6 (six) hours as needed for wheezing or shortness of breath., Disp: 18 g, Rfl: 0   fluticasone (FLONASE) 50 MCG/ACT nasal spray, Place 2 sprays into both nostrils daily., Disp: 16 g, Rfl: 6   propranolol  (INDERAL) 10 MG tablet, Take 1 tablet (10 mg total) by mouth 3 (three) times daily., Disp: 90 tablet, Rfl: 3   traZODone (DESYREL) 50 MG tablet, TAKE 1 TABLET BY MOUTH EVERYDAY AT BEDTIME, Disp: 90 tablet, Rfl: 2   Vilazodone HCl 20 MG TABS, TAKE 1 TABLET BY MOUTH EVERY DAY, Disp: 90 tablet, Rfl: 0   hyoscyamine (LEVSIN SL) 0.125 MG SL tablet, PLACE 1 TABLET UNDER THE TONGUE 4 (FOUR) TIMES DAILY AS NEEDED. (Patient not taking: Reported on 03/12/2022), Disp: 60 tablet, Rfl: 0  No Known Allergies  I personally reviewed active problem list, medication list, allergies, family history, social history, health maintenance with the patient/caregiver today.   ROS  Ten systems reviewed and is negative except as mentioned in HPI  Objective  Vitals:   03/12/22 1114  BP: 102/60  Pulse: 94  Resp: 16  Temp: 98.7 F (37.1 C)  SpO2: 98%  Weight: 177 lb (80.3 kg)  Height: '5\' 5"'$  (1.651 m)    Body mass index is 29.45 kg/m.  Physical Exam  Constitutional: Patient appears well-developed and well-nourished.  No distress.  HEENT: head atraumatic, normocephalic, pupils equal and reactive to light, ears normal bilaterally , neck supple, throat within normal limits, no lymphadenopathy  Cardiovascular: Normal rate, regular rhythm and normal heart sounds.  No murmur heard. No BLE edema. Pulmonary/Chest: Effort normal  and breath sounds normal. No respiratory distress. Abdominal: Soft.  There is no tenderness. Psychiatric: Patient has a normal mood and affect. behavior is normal. Judgment and thought content normal.   Recent Results (from the past 2160 hour(s))  HIV antibody (with reflex)     Status: None   Collection Time: 12/31/21  8:38 AM  Result Value Ref Range   HIV 1&2 Ab, 4th Generation NON-REACTIVE NON-REACTIVE    Comment: HIV-1 antigen and HIV-1/HIV-2 antibodies were not detected. There is no laboratory evidence of HIV infection. Marland Kitchen PLEASE NOTE: This information has been disclosed to you  from records whose confidentiality may be protected by state law.  If your state requires such protection, then the state law prohibits you from making any further disclosure of the information without the specific written consent of the person to whom it pertains, or as otherwise permitted by law. A general authorization for the release of medical or other information is NOT sufficient for this purpose. . For additional information please refer to http://education.quest[facility].com/faq/FAQ106 (This link is being provided for informational/ educational purposes only.) . Marland Kitchen The performance of this assay has not been clinically validated in patients less than 38 years old. .   Hepatitis B Surface AntiBODY     Status: Abnormal   Collection Time: 12/31/21  8:38 AM  Result Value Ref Range   Hep B S Ab REACTIVE (A) NON-REACTIVE  Hepatitis B Core Antibody, IgM     Status: None   Collection Time: 12/31/21  8:38 AM  Result Value Ref Range   Hep B C IgM NON-REACTIVE NON-REACTIVE  CBC with Differential     Status: None   Collection Time: 12/31/21  8:38 AM  Result Value Ref Range   WBC 5.6 3.8 - 10.8 Thousand/uL   RBC 5.43 4.20 - 5.80 Million/uL   Hemoglobin 16.2 13.2 - 17.1 g/dL   HCT 48.6 38.5 - 50.0 %   MCV 89.5 80.0 - 100.0 fL   MCH 29.8 27.0 - 33.0 pg   MCHC 33.3 32.0 - 36.0 g/dL   RDW 12.5 11.0 - 15.0 %   Platelets 271 140 - 400 Thousand/uL   MPV 9.8 7.5 - 12.5 fL   Neutro Abs 3,780 1,500 - 7,800 cells/uL   Lymphs Abs 1,187 850 - 3,900 cells/uL   Absolute Monocytes 504 200 - 950 cells/uL   Eosinophils Absolute 90 15 - 500 cells/uL   Basophils Absolute 39 0 - 200 cells/uL   Neutrophils Relative % 67.5 %   Total Lymphocyte 21.2 %   Monocytes Relative 9.0 %   Eosinophils Relative 1.6 %   Basophils Relative 0.7 %  Comprehensive metabolic panel     Status: None   Collection Time: 12/31/21  8:38 AM  Result Value Ref Range   Glucose, Bld 97 65 - 99 mg/dL    Comment: .             Fasting reference interval .    BUN 10 7 - 25 mg/dL   Creat 1.06 0.60 - 1.26 mg/dL   BUN/Creatinine Ratio NOT APPLICABLE 6 - 22 (calc)   Sodium 141 135 - 146 mmol/L   Potassium 4.7 3.5 - 5.3 mmol/L   Chloride 104 98 - 110 mmol/L   CO2 30 20 - 32 mmol/L   Calcium 10.0 8.6 - 10.3 mg/dL   Total Protein 7.0 6.1 - 8.1 g/dL   Albumin 4.6 3.6 - 5.1 g/dL   Globulin 2.4 1.9 - 3.7 g/dL (calc)  AG Ratio 1.9 1.0 - 2.5 (calc)   Total Bilirubin 0.7 0.2 - 1.2 mg/dL   Alkaline phosphatase (APISO) 72 36 - 130 U/L   AST 18 10 - 40 U/L   ALT 39 9 - 46 U/L  Hepatitis B surface antigen     Status: None   Collection Time: 12/31/21  8:38 AM  Result Value Ref Range   Hepatitis B Surface Ag NON-REACTIVE NON-REACTIVE  Hepatitis C antibody     Status: None   Collection Time: 12/31/21  8:38 AM  Result Value Ref Range   Hepatitis C Ab NON-REACTIVE NON-REACTIVE   SIGNAL TO CUT-OFF 0.12 <1.00    Comment: . HCV antibody was non-reactive. There is no laboratory  evidence of HCV infection. . In most cases, no further action is required. However, if recent HCV exposure is suspected, a test for HCV RNA (test code [phone]) is suggested. . For additional information please refer to http://education.[facility].com/faq/FAQ22v1 (This link is being provided for informational/ educational purposes only.) .     PHQ2/9:    03/12/2022   11:10 AM 12/31/2021    8:11 AM 10/21/2021    8:09 AM 08/25/2021    1:59 PM 07/01/2021    7:59 AM  Depression screen PHQ 2/9  Decreased Interest 1 0 0 0 0  Down, Depressed, Hopeless 0 0 1 1 0  PHQ - 2 Score 1 0 1 1 0  Altered sleeping '2  3 1 '$ 0  Tired, decreased energy '3  3 1 '$ 0  Change in appetite 1  0 0 0  Feeling bad or failure about yourself  0  0 0 0  Trouble concentrating 1  1 0 0  Moving slowly or fidgety/restless 0  0 0 0  Suicidal thoughts 0  0 0 0  PHQ-9 Score '8  8 3 '$ 0  Difficult doing work/chores   Somewhat difficult Not difficult at all Not difficult at  all    phq 9 is positive   Fall Risk:    03/12/2022   11:10 AM 12/31/2021    8:10 AM 10/21/2021    8:08 AM 08/25/2021    1:53 PM 07/01/2021    7:58 AM  Fall Risk   Falls in the past year? 0 0 1 1 0  Number falls in past yr: 0 0 0 0 0  Injury with Fall? 0 0 0 0 0  Risk for fall due to : No Fall Risks  Impaired balance/gait  No Fall Risks  Follow up Falls prevention discussed Falls evaluation completed Falls prevention discussed;Education provided  Falls prevention discussed      Functional Status Survey: Is the patient deaf or have difficulty hearing?: No Does the patient have difficulty seeing, even when wearing glasses/contacts?: No Does the patient have difficulty concentrating, remembering, or making decisions?: Yes Does the patient have difficulty walking or climbing stairs?: No Does the patient have difficulty dressing or bathing?: No Does the patient have difficulty doing errands alone such as visiting a doctor's office or shopping?: No    Assessment & Plan  1. Sore throat  - POCT rapid strep A  2. URI, acute   3. Acute cough  - benzonatate (TESSALON) 100 MG capsule; Take 1-2 capsules (100-200 mg total) by mouth 2 (two) times daily as needed.  Dispense: 40 capsule; Refill: 0  4. Nausea  - ondansetron (ZOFRAN) 4 MG tablet; Take 1 tablet (4 mg total) by mouth every 8 (eight) hours as needed for nausea or vomiting.  Dispense: 10 tablet; Refill: 0

## 2022-03-16 ENCOUNTER — Ambulatory Visit: Payer: Self-pay

## 2022-03-16 NOTE — Telephone Encounter (Signed)
Pt is calling to let Dr Ancil Boozer know that he is feeling worse. Pt had ov last Thursday. And tested positve for COVID today. Pt reporting diarrhea and vomiting with heat in chest.     Chief Complaint: Seen in office 03/12/22 with sore throat. Tested positive today for COVID 19. Has vomiting and diarrhea today. Has not picked up Zofran. Will get that today. Asking for further recommendations. Symptoms: Above Frequency: Last week Pertinent Negatives: Patient denies SOB Disposition: '[]'$ ED /'[]'$ Urgent Care (no appt availability in office) / '[]'$ Appointment(In office/virtual)/ '[]'$  [facility]/ '[]'$ Home Care/ '[]'$ Refused Recommended Disposition /'[]'$ [facility]/ '[x]'$  Follow-up with PCP Additional Notes: Please advise pt.  Answer Assessment - Initial Assessment Questions 1. COVID-19 DIAGNOSIS: "How do you know that you have COVID?" (e.g., positive lab test or self-test, diagnosed by doctor or NP/PA, symptoms after exposure).     Home test 2. COVID-19 EXPOSURE: "Was there any known exposure to COVID before the symptoms began?" CDC Definition of close contact: within 6 feet (2 meters) for a total of 15 minutes or more over a 24-hour period.      No 3. ONSET: "When did the COVID-19 symptoms start?"      Last week 4. WORST SYMPTOM: "What is your worst symptom?" (e.g., cough, fever, shortness of breath, muscle aches)     Vomiting, diarrhea 5. COUGH: "Do you have a cough?" If Yes, ask: "How bad is the cough?"       Yes 6. FEVER: "Do you have a fever?" If Yes, ask: "What is your temperature, how was it measured, and when did it start?"     Unsure 7. RESPIRATORY STATUS: "Describe your breathing?" (e.g., normal; shortness of breath, wheezing, unable to speak)      No 8. BETTER-SAME-WORSE: "Are you getting better, staying the same or getting worse compared to yesterday?"  If getting worse, ask, "In what way?"     Worse 9. OTHER SYMPTOMS: "Do you have any other symptoms?"  (e.g., chills, fatigue,  headache, loss of smell or taste, muscle pain, sore throat)     Sore throat 10. HIGH RISK DISEASE: "Do you have any chronic medical problems?" (e.g., asthma, heart or lung disease, weak immune system, obesity, etc.)       No 11. VACCINE: "Have you had the COVID-19 vaccine?" If Yes, ask: "Which one, how many shots, when did you get it?"       N/a 12. PREGNANCY: "Is there any chance you are pregnant?" "When was your last menstrual period?"       N/a 13. O2 SATURATION MONITOR:  "Do you use an oxygen saturation monitor (pulse oximeter) at home?" If Yes, ask "What is your reading (oxygen level) today?" "What is your usual oxygen saturation reading?" (e.g., 95%)       No  Protocols used: Coronavirus (COVID-19) Diagnosed or Suspected-A-AH

## 2022-03-17 NOTE — Telephone Encounter (Signed)
Called patient and let him know your recommendations and that there is an rx for Zofran at the pharmacy. Patient gave verbal understanding.

## 2022-03-24 ENCOUNTER — Ambulatory Visit: Payer: 59 | Admitting: Behavioral Health

## 2022-04-06 ENCOUNTER — Ambulatory Visit: Payer: Managed Care, Other (non HMO) | Admitting: Dermatology

## 2022-04-07 ENCOUNTER — Ambulatory Visit: Payer: 59 | Admitting: Behavioral Health

## 2022-04-15 ENCOUNTER — Ambulatory Visit: Payer: Managed Care, Other (non HMO) | Admitting: Family Medicine

## 2022-04-15 ENCOUNTER — Encounter: Payer: Self-pay | Admitting: Family Medicine

## 2022-04-15 VITALS — BP 118/64 | HR 73 | Temp 97.9°F | Resp 18 | Ht 65.0 in | Wt 184.9 lb

## 2022-04-15 DIAGNOSIS — K625 Hemorrhage of anus and rectum: Secondary | ICD-10-CM | POA: Diagnosis not present

## 2022-04-15 LAB — CBC WITH DIFFERENTIAL/PLATELET
Absolute Monocytes: 549 cells/uL (ref 200–950)
Basophils Absolute: 30 cells/uL (ref 0–200)
Basophils Relative: 0.5 %
Eosinophils Absolute: 148 cells/uL (ref 15–500)
Eosinophils Relative: 2.5 %
HCT: 45.1 % (ref 38.5–50.0)
Hemoglobin: 15.5 g/dL (ref 13.2–17.1)
Lymphs Abs: 1558 cells/uL (ref 850–3900)
MCH: 30.5 pg (ref 27.0–33.0)
MCHC: 34.4 g/dL (ref 32.0–36.0)
MCV: 88.6 fL (ref 80.0–100.0)
MPV: 10 fL (ref 7.5–12.5)
Monocytes Relative: 9.3 %
Neutro Abs: 3617 cells/uL (ref 1500–7800)
Neutrophils Relative %: 61.3 %
Platelets: 243 10*3/uL (ref 140–400)
RBC: 5.09 10*6/uL (ref 4.20–5.80)
RDW: 12.8 % (ref 11.0–15.0)
Total Lymphocyte: 26.4 %
WBC: 5.9 10*3/uL (ref 3.8–10.8)

## 2022-04-15 NOTE — Progress Notes (Signed)
Name: Anthony Hale   MRN: [id number]    DOB: 04-Dec-1985   Date:04/15/2022       Progress Note  Subjective  Chief Complaint  Rectal Bleeding  HPI  He states he noticed some discomfort on anal area since Saturday after he wipe when he finished a bowel movement. He states normal bowel movements except for lower abdominal pain and urge to have a bowel movement in the early hours of Tuesday, stools were loose but no blood. This morning he work up and his underwear had blood and has been sipping blood since he woke up. No bowel movements today. No fever or chills. Appetite is normal  He denies any family history of ulcerative colitis or Chron's disease  Patient Active Problem List   Diagnosis Date Noted   Lateral epicondylitis, left elbow 03/18/2020   Encounter for biometric screening 10/09/2019   Depression, major, recurrent, mild ([place]) 08/28/2015   Generalized anxiety disorder 08/28/2015   IBS (irritable bowel syndrome) 04/03/2015   Fatty tumor 12/31/2014   Decorative tattoo 12/31/2014   Asthma, cough variant 12/30/2014   Insomnia 12/30/2014   GERD (gastroesophageal reflux disease) 12/30/2014   Perennial allergic rhinitis with seasonal variation 05/23/2007    Past Surgical History:  Procedure Laterality Date   WISDOM TOOTH EXTRACTION      Family History  Problem Relation Age of Onset   Cancer Mother        Breast   Hyperlipidemia Mother    Heart disease Father    Hyperlipidemia Father    Hypertension Father     Social History   Tobacco Use   Smoking status: Never   Smokeless tobacco: Never  Substance Use Topics   Alcohol use: Yes    Alcohol/week: 0.0 standard drinks of alcohol    Comment: rarely     Current Outpatient Medications:    albuterol (VENTOLIN HFA) 108 (90 Base) MCG/ACT inhaler, Inhale 2 puffs into the lungs every 6 (six) hours as needed for wheezing or shortness of breath., Disp: 18 g, Rfl: 0   fluticasone (FLONASE) 50 MCG/ACT nasal spray, Place 2  sprays into both nostrils daily., Disp: 16 g, Rfl: 6   propranolol (INDERAL) 10 MG tablet, Take 1 tablet (10 mg total) by mouth 3 (three) times daily., Disp: 90 tablet, Rfl: 3   traZODone (DESYREL) 50 MG tablet, TAKE 1 TABLET BY MOUTH EVERYDAY AT BEDTIME, Disp: 90 tablet, Rfl: 2   Vilazodone HCl 20 MG TABS, TAKE 1 TABLET BY MOUTH EVERY DAY, Disp: 90 tablet, Rfl: 0  No Known Allergies  I personally reviewed active problem list, medication list, allergies, family history, social history, health maintenance with the patient/caregiver today.   ROS  Ten systems reviewed and is negative except as mentioned in HPI   Objective  Vitals:   04/15/22 1019  BP: 118/64  Pulse: 73  Resp: 18  Temp: 97.9 F (36.6 C)  SpO2: 98%  Weight: 184 lb 14.4 oz (83.9 kg)  Height: '5\' 5"'$  (1.651 m)    Body mass index is 30.77 kg/m.  Physical Exam  Constitutional: Patient appears well-developed and well-nourished. Obese  No distress.  HEENT: head atraumatic, normocephalic, pupils equal and reactive to light, neck supple, throat within normal limits Cardiovascular: Normal rate, regular rhythm and normal heart sounds.  No murmur heard. No BLE edema. Pulmonary/Chest: Effort normal and breath sounds normal. No respiratory distress. Abdominal: Soft.  There is no tenderness. Rectal exam has a hemorrhoid that is tender to touch at 11  o'clock, blood present on anal area, tender when trying to do rectal exam and since  Psychiatric: Patient has a normal mood and affect. behavior is normal. Judgment and thought content normal.   Recent Results (from the past 2160 hour(s))  POCT rapid strep A     Status: None   Collection Time: 03/12/22 11:08 AM  Result Value Ref Range   Rapid Strep A Screen Negative Negative    PHQ2/9:    04/15/2022   10:27 AM 03/12/2022   11:10 AM 12/31/2021    8:11 AM 10/21/2021    8:09 AM 08/25/2021    1:59 PM  Depression screen PHQ 2/9  Decreased Interest 0 1 0 0 0  Down, Depressed,  Hopeless 0 0 0 1 1  PHQ - 2 Score 0 1 0 1 1  Altered sleeping  '2  3 1  '$ Tired, decreased energy  '3  3 1  '$ Change in appetite  1  0 0  Feeling bad or failure about yourself   0  0 0  Trouble concentrating  1  1 0  Moving slowly or fidgety/restless  0  0 0  Suicidal thoughts  0  0 0  PHQ-9 Score  '8  8 3  '$ Difficult doing work/chores    Somewhat difficult Not difficult at all    phq 9 is negative   Fall Risk:    04/15/2022   10:27 AM 03/12/2022   11:10 AM 12/31/2021    8:10 AM 10/21/2021    8:08 AM 08/25/2021    1:53 PM  Fall Risk   Falls in the past year? 0 0 0 1 1  Number falls in past yr: 0 0 0 0 0  Injury with Fall? 0 0 0 0 0  Risk for fall due to : No Fall Risks No Fall Risks  Impaired balance/gait   Follow up Falls prevention discussed Falls prevention discussed Falls evaluation completed Falls prevention discussed;Education provided       Functional Status Survey: Is the patient deaf or have difficulty hearing?: No Does the patient have difficulty seeing, even when wearing glasses/contacts?: No Does the patient have difficulty concentrating, remembering, or making decisions?: No Does the patient have difficulty walking or climbing stairs?: No Does the patient have difficulty dressing or bathing?: No Does the patient have difficulty doing errands alone such as visiting a doctor's office or shopping?: No    Assessment & Plan  1. Rectal bleeding  - Ambulatory referral to Gastroenterology - CBC with Differential/Platelet   Go to [facility] if bleeding gets worse, spoke to Dr. Vicente Males and he will see patient tomorrow

## 2022-04-16 ENCOUNTER — Other Ambulatory Visit: Payer: Self-pay

## 2022-04-16 ENCOUNTER — Ambulatory Visit: Payer: Managed Care, Other (non HMO) | Admitting: Gastroenterology

## 2022-04-16 ENCOUNTER — Encounter: Payer: Self-pay | Admitting: Gastroenterology

## 2022-04-16 VITALS — BP 138/90 | HR 71 | Temp 99.3°F | Wt 173.0 lb

## 2022-04-16 DIAGNOSIS — K625 Hemorrhage of anus and rectum: Secondary | ICD-10-CM

## 2022-04-16 DIAGNOSIS — D802 Selective deficiency of immunoglobulin A [IgA]: Secondary | ICD-10-CM

## 2022-04-16 DIAGNOSIS — D804 Selective deficiency of immunoglobulin M [IgM]: Secondary | ICD-10-CM | POA: Diagnosis not present

## 2022-04-16 MED ORDER — NA SULFATE-K SULFATE-MG SULF 17.5-3.13-1.6 GM/177ML PO SOLN: 354.0000 mL | Freq: Once | ORAL | 0 refills | Status: AC

## 2022-04-16 NOTE — Progress Notes (Signed)
Anthony Bellows MD, MRCP(U.K) [address]  [place]  [place], [place] [address]  Main: [phone]  Fax: [phone]   Primary Care Physician: Steele Sizer, MD  Primary Gastroenterologist:  Dr. Jonathon Hale   Chief Complaint  Patient presents with   Follow-up    HPI: Anthony Hale is a 36 y.o. male   Summary of history : He was previously seen by [place] back in 2022 for abdominal pain and diarrhea likely secondary to osmotic etiology due to consumption of large quantities of high fructose corn syrup and sodas in his diet.  Symptoms resolved when he cut them down.   01/07/2021: Celiac serology negative, H. pylori breath test negative, IgA levels were low hence requested to check TTG IgG, hemoglobin 15.6, CMP normal, TSH normal, HbA1c normal.    Interval history 06/18/2021-04/16/2022   He was seen by Dr. Ancil Boozer yesterday for rectal bleeding and discomfort in the anal area.  He says that over the weekend he had some abdominal discomfort subsequently had woken up on Tuesday morning with blood in his undergarments bright red in color no pain associated.  Then resolved and this morning after bowel movement also had bright red blood when he wiped.  Some perianal discomfort.  No prior colonoscopy no family history of colon cancer some weight loss recently.  04/15/2022 hemoglobin 15.5 g     Current Outpatient Medications  Medication Sig Dispense Refill   albuterol (VENTOLIN HFA) 108 (90 Base) MCG/ACT inhaler Inhale 2 puffs into the lungs every 6 (six) hours as needed for wheezing or shortness of breath. 18 g 0   fluticasone (FLONASE) 50 MCG/ACT nasal spray Place 2 sprays into both nostrils daily. 16 g 6   propranolol (INDERAL) 10 MG tablet Take 1 tablet (10 mg total) by mouth 3 (three) times daily. 90 tablet 3   traZODone (DESYREL) 50 MG tablet TAKE 1 TABLET BY MOUTH EVERYDAY AT BEDTIME 90 tablet 2   Vilazodone HCl 20 MG TABS TAKE 1 TABLET BY MOUTH EVERY DAY 90 tablet 0   No  current facility-administered medications for this visit.    Allergies as of 04/16/2022   (No Known Allergies)    ROS:  General: Negative for anorexia, weight loss, fever, chills, fatigue, weakness. ENT: Negative for hoarseness, difficulty swallowing , nasal congestion. CV: Negative for chest pain, angina, palpitations, dyspnea on exertion, peripheral edema.  Respiratory: Negative for dyspnea at rest, dyspnea on exertion, cough, sputum, wheezing.  GI: See history of present illness. GU:  Negative for dysuria, hematuria, urinary incontinence, urinary frequency, nocturnal urination.  Endo: Negative for unusual weight change.    Physical Examination:   BP (!) 138/90   Pulse 71   Temp 99.3 F (37.4 C) (Oral)   Wt 173 lb (78.5 kg)   BMI 28.79 kg/m   General: Well-nourished, well-developed in no acute distress.  Eyes: No icterus. Conjunctivae pink. Rectal exam chaperone present in the room: Externally in the anal canal old blood noted.  Prolapsed hemorrhoid which appears to have burst at 3 o'clock position.  Discomfort when I tried to insert my finger did not proceed further but no pain no tenderness in the perianal area. Neuro: Alert and oriented x 3.  Grossly intact. Skin: Warm and dry, no jaundice.   Psych: Alert and cooperative, normal mood and affect.   Imaging Studies: No results found.  Assessment and Plan:   CLINE DRAHEIM is a 36 y.o. y/o male was last seen at our office  back in 2022 for osmotic diarrhea.  Here today to see me for an urgent visit for rectal bleeding.  Perianal exam suggests a possible prolapsed hemorrhoid that has burst.  We will need to rule out other pathology on the inside of the colon   Plan 1.  Colonoscopy next week 2.  Sitz bath daily, high-fiber diet, discussed about perianal hygiene and toileting.  If there is further significant bleeding needs to come to the emergency room otherwise if colonoscopy shows no other abnormalities we will bring  him back in the office in a week's time to band his hemorrhoids   I have discussed alternative options, risks & benefits,  which include, but are not limited to, bleeding, infection, perforation,respiratory complication & drug reaction.  The patient agrees with this plan & written consent will be obtained.     Dr Anthony Bellows  MD,MRCP [facility]) Follow up in 1 week

## 2022-04-16 NOTE — Patient Instructions (Addendum)
Hemorrhoids Hemorrhoids are swollen veins in and around the rectum or anus. There are two types of hemorrhoids: Internal hemorrhoids. These occur in the veins that are just inside the rectum. They may poke through to the outside and become irritated and painful. External hemorrhoids. These occur in the veins that are outside the anus and can be felt as a painful swelling or hard lump near the anus. Most hemorrhoids do not cause serious problems, and they can be managed with home treatments such as diet and lifestyle changes. If home treatments do not help the symptoms, procedures can be done to shrink or remove the hemorrhoids. What are the causes? This condition is caused by increased pressure in the anal area. This pressure may result from various things, including: Constipation. Straining to have a bowel movement. Diarrhea. Pregnancy. Obesity. Sitting for long periods of time. Heavy lifting or other activity that causes you to strain. Anal sex. Riding a bike for a long period of time. What are the signs or symptoms? Symptoms of this condition include: Pain. Anal itching or irritation. Rectal bleeding. Leakage of stool (feces). Anal swelling. One or more lumps around the anus. How is this diagnosed? This condition can often be diagnosed through a visual exam. Other exams or tests may also be done, such as: An exam that involves feeling the rectal area with a gloved hand (digital rectal exam). An exam of the anal canal that is done using a small tube (anoscope). A blood test, if you have lost a significant amount of blood. A test to look inside the colon using a flexible tube with a camera on the end (sigmoidoscopy or colonoscopy). How is this treated? This condition can usually be treated at home. However, various procedures may be done if dietary changes, lifestyle changes, and other home treatments do not help your symptoms. These procedures can help make the hemorrhoids smaller or  remove them completely. Some of these procedures involve surgery, and others do not. Common procedures include: Rubber band ligation. Rubber bands are placed at the base of the hemorrhoids to cut off their blood supply. Sclerotherapy. Medicine is injected into the hemorrhoids to shrink them. Infrared coagulation. A type of light energy is used to get rid of the hemorrhoids. Hemorrhoidectomy surgery. The hemorrhoids are surgically removed, and the veins that supply them are tied off. Stapled hemorrhoidopexy surgery. The surgeon staples the base of the hemorrhoid to the rectal wall. Follow these instructions at home: Eating and drinking  Eat foods that have a lot of fiber in them, such as whole grains, beans, nuts, fruits, and vegetables. Ask your health care provider about taking products that have added fiber (fiber supplements). Reduce the amount of fat in your diet. You can do this by eating low-fat dairy products, eating less red meat, and avoiding processed foods. Drink enough fluid to keep your urine pale yellow. Managing pain and swelling  Take warm sitz baths for 20 minutes, 3-4 times a day to ease pain and discomfort. You may do this in a bathtub or using a portable sitz bath that fits over the toilet. If directed, apply ice to the affected area. Using ice packs between sitz baths may be helpful. Put ice in a plastic bag. Place a towel between your skin and the bag. Leave the ice on for 20 minutes, 2-3 times a day. General instructions Take over-the-counter and prescription medicines only as told by your health care provider. Use medicated creams or suppositories as told. Get regular exercise.   Ask your health care provider how much and what kind of exercise is best for you. In general, you should do moderate exercise for at least 30 minutes on most days of the week (150 minutes each week). This can include activities such as walking, biking, or yoga. Go to the bathroom when you have  the urge to have a bowel movement. Do not wait. Avoid straining to have bowel movements. Keep the anal area dry and clean. Use wet toilet paper or moist towelettes after a bowel movement. Do not sit on the toilet for long periods of time. This increases blood pooling and pain. Keep all follow-up visits as told by your health care provider. This is important. Contact a health care provider if you have: Increasing pain and swelling that are not controlled by treatment or medicine. Difficulty having a bowel movement, or you are unable to have a bowel movement. Pain or inflammation outside the area of the hemorrhoids. Get help right away if you have: Uncontrolled bleeding from your rectum. Summary Hemorrhoids are swollen veins in and around the rectum or anus. Most hemorrhoids can be managed with home treatments such as diet and lifestyle changes. Taking warm sitz baths can help ease pain and discomfort. In severe cases, procedures or surgery can be done to shrink or remove the hemorrhoids. This information is not intended to replace advice given to you by your health care provider. Make sure you discuss any questions you have with your health care provider. Document Revised: 01/15/2021 Document Reviewed: 01/15/2021 Elsevier Patient Education  [place]. How to Take a [employer] A sitz bath is a warm water bath that may be used to care for your rectum, genital area, or the area between your rectum and genitals (perineum). In a sitz bath, the water only comes up to your hips and covers your buttocks. A sitz bath may be done in a bathtub or with a portable sitz bath that fits over the toilet. Your health care provider may recommend a sitz bath to help: Relieve pain and discomfort after delivering a baby. Relieve pain and itching from hemorrhoids or anal fissures. Relieve pain after certain surgeries. Relax muscles that are sore or tight. How to take a sitz bath Take 2-4 sitz baths a day,  or as many as told by your health care provider. Bathtub sitz bath To take a sitz bath in a bathtub: Partially fill a bathtub with warm water. The water should be deep enough to cover your hips and buttocks when you are sitting in the bathtub. Follow your health care provider's instructions if you are told to put medicine in the water. Sit in the water. Open the bathtub drain a little, and leave it open during your bath. Turn on the warm water again, enough to replace the water that is draining out. Keep the water running throughout your bath. This helps keep the water at the right level and temperature. Soak in the water for 15-20 minutes, or as long as told by your health care provider. When you are done, be careful when you stand up. You may feel dizzy. After the sitz bath, pat yourself dry. Do not rub your skin to dry it.  Over-the-toilet sitz bath To take a sitz bath with an over-the-toilet basin: Follow the manufacturer's instructions. Fill the basin with warm water. Follow your health care provider's instructions if you were told to put medicine in the water. Sit on the seat. Make sure the water covers your  buttocks and perineum. Soak in the water for 15-20 minutes, or as long as told by your health care provider. After the sitz bath, pat yourself dry. Do not rub your skin to dry it. Clean and dry the basin between uses. Discard the basin if it cracks, or according to the manufacturer's instructions.  Contact a health care provider if: Your pain or itching gets worse. Stop doing sitz baths if your symptoms get worse. You have new symptoms. Stop doing sitz baths until you talk with your health care provider. Summary A sitz bath is a warm water bath in which the water only comes up to your hips and covers your buttocks. Your health care provider may recommend a sitz bath to help relieve pain and discomfort after delivering a baby, relieve pain and itching from hemorrhoids or anal  fissures, relieve pain after certain surgeries, or help to relax muscles that are sore or tight. Take 2-4 sitz baths a day, or as many as told by your health care provider. Soak in the water for 15-20 minutes. Stop doing sitz baths if your symptoms get worse. This information is not intended to replace advice given to you by your health care provider. Make sure you discuss any questions you have with your health care provider. Document Revised: 10/07/2021 Document Reviewed: 10/07/2021 Elsevier Patient Education  [place].

## 2022-04-20 ENCOUNTER — Encounter: Payer: Self-pay | Admitting: Gastroenterology

## 2022-04-21 ENCOUNTER — Ambulatory Visit: Payer: Managed Care, Other (non HMO) | Admitting: Anesthesiology

## 2022-04-21 ENCOUNTER — Encounter
Admission: RE | Disposition: A | Discharge status: Home / Self Care | Payer: Self-pay | Source: Home / Self Care | Attending: Gastroenterology

## 2022-04-21 ENCOUNTER — Ambulatory Visit
Admission: RE | Admit: 2022-04-21 | Discharge: 2022-04-21 | Disposition: A | Discharge status: Home / Self Care | Payer: Managed Care, Other (non HMO) | Attending: Gastroenterology | Admitting: Gastroenterology

## 2022-04-21 DIAGNOSIS — D126 Benign neoplasm of colon, unspecified: Secondary | ICD-10-CM

## 2022-04-21 DIAGNOSIS — J45909 Unspecified asthma, uncomplicated: Secondary | ICD-10-CM | POA: Insufficient documentation

## 2022-04-21 DIAGNOSIS — K625 Hemorrhage of anus and rectum: Secondary | ICD-10-CM | POA: Diagnosis present

## 2022-04-21 DIAGNOSIS — K640 First degree hemorrhoids: Secondary | ICD-10-CM | POA: Diagnosis not present

## 2022-04-21 DIAGNOSIS — D122 Benign neoplasm of ascending colon: Secondary | ICD-10-CM | POA: Diagnosis not present

## 2022-04-21 DIAGNOSIS — F419 Anxiety disorder, unspecified: Secondary | ICD-10-CM | POA: Insufficient documentation

## 2022-04-21 HISTORY — PX: COLONOSCOPY WITH PROPOFOL: SHX5780

## 2022-04-21 SURGERY — COLONOSCOPY WITH PROPOFOL
Anesthesia: General

## 2022-04-21 MED ORDER — LIDOCAINE HCL (CARDIAC) PF 100 MG/5ML IV SOSY
PREFILLED_SYRINGE | INTRAVENOUS | Status: DC | PRN
  Administered 2022-04-21: 60 mg via INTRAVENOUS

## 2022-04-21 MED ORDER — SODIUM CHLORIDE 0.9 % IV SOLN: INTRAVENOUS | Status: DC

## 2022-04-21 MED ORDER — PROPOFOL 10 MG/ML IV BOLUS
INTRAVENOUS | Status: DC | PRN
  Administered 2022-04-21: 70 mg via INTRAVENOUS
  Administered 2022-04-21: 50 mg via INTRAVENOUS
  Administered 2022-04-21: 40 mg via INTRAVENOUS
  Administered 2022-04-21: 50 mg via INTRAVENOUS
  Administered 2022-04-21: 130 mg via INTRAVENOUS

## 2022-04-21 NOTE — H&P (Signed)
Jonathon Bellows, MD [address] [place], [place], [place], [address] [address], [place], [place], [place], [address] Phone: [phone]  Fax: [phone]  Primary Care Physician:  Steele Sizer, MD   Pre-Procedure History & Physical: HPI:  Anthony Hale is a 36 y.o. male is here for an colonoscopy.   Past Medical History:  Diagnosis Date   Allergy    Anxiety    Asthma    GERD (gastroesophageal reflux disease)    Insomnia    Lipoma     Past Surgical History:  Procedure Laterality Date   WISDOM TOOTH EXTRACTION      Prior to Admission medications   Medication Sig Start Date End Date Taking? Authorizing Provider  albuterol (VENTOLIN HFA) 108 (90 Base) MCG/ACT inhaler Inhale 2 puffs into the lungs every 6 (six) hours as needed for wheezing or shortness of breath. 05/28/20   Steele Sizer, MD  fluticasone (FLONASE) 50 MCG/ACT nasal spray Place 2 sprays into both nostrils daily. 07/01/21   Teodora Medici, DO  propranolol (INDERAL) 10 MG tablet Take 1 tablet (10 mg total) by mouth 3 (three) times daily. 06/16/21   Elwanda Brooklyn, NP  traZODone (DESYREL) 50 MG tablet TAKE 1 TABLET BY MOUTH EVERYDAY AT BEDTIME 06/16/21   Elwanda Brooklyn, NP  Vilazodone HCl 20 MG TABS TAKE 1 TABLET BY MOUTH EVERY DAY 02/26/22   Elwanda Brooklyn, NP    Allergies as of 04/16/2022   (No Known Allergies)    Family History  Problem Relation Age of Onset   Cancer Mother        Breast   Hyperlipidemia Mother    Heart disease Father    Hyperlipidemia Father    Hypertension Father     Social History   Socioeconomic History   Marital status: Divorced    Spouse name: Not on file   Number of children: 0   Years of education: Not on file   Highest education level: Master's degree (e.g., MA, MS, MEng, MEd, MSW, MBA)  Occupational History   Occupation: Quarry manager  Tobacco Use   Smoking status: Never   Smokeless tobacco: Never  Vaping Use   Vaping Use: Never used   Substance and Sexual Activity   Alcohol use: Yes    Alcohol/week: 0.0 standard drinks of alcohol    Comment: rarely   Drug use: No   Sexual activity: Yes    Partners: Female  Other Topics Concern   Not on file  Social History Narrative   Married on March 19th, 2017, marriage was never consummated and she left on June 25 th, 2017   Divorced Summer 2018   Still work as a Nutritional therapist for [employer]   He has a Production assistant, radio in Energy manager and two masters ( Conservator, museum/gallery and criminal justice administration)    Currently Living in [place] Strain: [place]  (06/03/2021)   Overall Financial Resource Strain (CARDIA)    Difficulty of Paying Living Expenses: Not hard at all  Food Insecurity: No Food Insecurity (06/03/2021)   Hunger Vital Sign    Worried About Running Out of Food in the Last Year: Never true    [place] in the Last Year: Never true  Transportation Needs: No Transportation Needs (06/03/2021)   PRAPARE - Hydrologist (Medical): No    Lack of Transportation (  Non-Medical): No  Physical Activity: Inactive (06/03/2021)   Exercise Vital Sign    Days of Exercise per Week: 0 days    Minutes of Exercise per Session: 0 min  Stress: Stress Concern Present (06/03/2021)   [place]    Feeling of Stress : Rather much  Social Connections: Moderately Isolated (06/03/2021)   Social Connection and Isolation Panel [NHANES]    Frequency of Communication with Friends and Family: More than three times a week    Frequency of Social Gatherings with Friends and Family: More than three times a week    Attends Religious Services: Never    Marine scientist or Organizations: No    Attends Music therapist: More than 4 times per year    Marital Status: Divorced  Human resources officer  Violence: Not At Risk (06/03/2021)   Humiliation, Afraid, Rape, and Kick questionnaire    Fear of Current or Ex-Partner: No    Emotionally Abused: No    Physically Abused: No    Sexually Abused: No    Review of Systems: See HPI, otherwise negative ROS  Physical Exam: There were no vitals taken for this visit. General:   Alert,  pleasant and cooperative in NAD Head:  Normocephalic and atraumatic. Neck:  Supple; no masses or thyromegaly. Lungs:  Clear throughout to auscultation, normal respiratory effort.    Heart:  +S1, +S2, Regular rate and rhythm, No edema. Abdomen:  Soft, nontender and nondistended. Normal bowel sounds, without guarding, and without rebound.   Neurologic:  Alert and  oriented x4;  grossly normal neurologically.  Impression/Plan: Anthony Hale is here for an colonoscopy to be performed for rectal bleeding. Risks, benefits, limitations, and alternatives regarding  colonoscopy have been reviewed with the patient.  Questions have been answered.  All parties agreeable.   Jonathon Bellows, MD  04/21/2022, 9:13 AM

## 2022-04-21 NOTE — Anesthesia Postprocedure Evaluation (Signed)
Anesthesia Post Note  Patient: Anthony Hale  Procedure(s) Performed: COLONOSCOPY WITH PROPOFOL  Patient location during evaluation: Endoscopy Anesthesia Type: General Level of consciousness: awake and alert Pain management: pain level controlled Vital Signs Assessment: post-procedure vital signs reviewed and stable Respiratory status: spontaneous breathing, nonlabored ventilation, respiratory function stable and patient connected to nasal cannula oxygen Cardiovascular status: blood pressure returned to baseline and stable Postop Assessment: no apparent nausea or vomiting Anesthetic complications: no   No notable events documented.   Last Vitals:  Vitals:   04/21/22 1005 04/21/22 1015  BP: 107/69 114/72  Pulse: 77 67  Resp: 15 14  Temp:    SpO2: 99% 98%    Last Pain:  Vitals:   04/21/22 1015  TempSrc:   PainSc: 0-No pain                 Precious Haws Verneda Hollopeter

## 2022-04-21 NOTE — Anesthesia Preprocedure Evaluation (Signed)
Anesthesia Evaluation  Patient identified by MRN, date of birth, ID band Patient awake    Reviewed: Allergy & Precautions, NPO status , Patient's Chart, lab work & pertinent test results  History of Anesthesia Complications Negative for: history of anesthetic complications  Airway Mallampati: III  TM Distance: <3 FB Neck ROM: full    Dental  (+) Chipped   Pulmonary neg shortness of breath, asthma ,    Pulmonary exam normal        Cardiovascular Exercise Tolerance: Good (-) anginanegative cardio ROS Normal cardiovascular exam     Neuro/Psych negative neurological ROS  negative psych ROS   GI/Hepatic Neg liver ROS, GERD  Controlled,  Endo/Other  negative endocrine ROS  Renal/GU negative Renal ROS  negative genitourinary   Musculoskeletal   Abdominal   Peds  Hematology negative hematology ROS (+)   Anesthesia Other Findings Past Medical History: No date: Allergy No date: Anxiety No date: Asthma No date: GERD (gastroesophageal reflux disease) No date: Insomnia No date: Lipoma  Past Surgical History: No date: WISDOM TOOTH EXTRACTION  BMI    Body Mass Index: 27.22 kg/m      Reproductive/Obstetrics negative OB ROS                             Anesthesia Physical Anesthesia Plan  ASA: 3  Anesthesia Plan: General   Post-op Pain Management:    Induction: Intravenous  PONV Risk Score and Plan: Propofol infusion and TIVA  Airway Management Planned: Natural Airway and Nasal Cannula  Additional Equipment:   Intra-op Plan:   Post-operative Plan:   Informed Consent: I have reviewed the patients History and Physical, chart, labs and discussed the procedure including the risks, benefits and alternatives for the proposed anesthesia with the patient or authorized representative who has indicated his/her understanding and acceptance.     Dental Advisory Given  Plan Discussed  with: Anesthesiologist, CRNA and Surgeon  Anesthesia Plan Comments: (Patient consented for risks of anesthesia including but not limited to:  - adverse reactions to medications - risk of airway placement if required - damage to eyes, teeth, lips or other oral mucosa - nerve damage due to positioning  - sore throat or hoarseness - Damage to heart, brain, nerves, lungs, other parts of body or loss of life  Patient voiced understanding.)        Anesthesia Quick Evaluation

## 2022-04-21 NOTE — Op Note (Signed)
[facility] Gastroenterology Patient Name: Anthony Hale Procedure Date: 04/21/2022 9:15 AM MRN: [id number] Account #: [id number] Date of Birth: January 18, 1986 Admit Type: Outpatient Age: 36 Room: [facility] ENDO ROOM 2 Gender: Male Note Status: Finalized Instrument Name: Jasper Riling [id number] Procedure:             Colonoscopy Indications:           Rectal bleeding Providers:             Jonathon Bellows MD, MD Referring MD:          Bethena Roys. Sowles, MD (Referring MD) Medicines:             Monitored Anesthesia Care Complications:         No immediate complications. Procedure:             Pre-Anesthesia Assessment:                        - Prior to the procedure, a History and Physical was                         performed, and patient medications, allergies and                         sensitivities were reviewed. The patient's tolerance                         of previous anesthesia was reviewed.                        - The risks and benefits of the procedure and the                         sedation options and risks were discussed with the                         patient. All questions were answered and informed                         consent was obtained.                        - ASA Grade Assessment: II - A patient with mild                         systemic disease.                        After obtaining informed consent, the colonoscope was                         passed under direct vision. Throughout the procedure,                         the patient's blood pressure, pulse, and oxygen                         saturations were monitored continuously. The                         Colonoscope was introduced through the  anus and                         advanced to the the cecum, identified by the                         appendiceal orifice. The colonoscopy was performed                         with ease. The patient tolerated the procedure well.                         The  quality of the bowel preparation was excellent. Findings:      Hemorrhoids were found on perianal exam.      Four sessile polyps were found in the ascending colon. The polyps were 5       to 7 mm in size. These polyps were removed with a cold snare. Resection       and retrieval were complete.      Non-bleeding thrombosed internal hemorrhoids were found during       retroflexion. The hemorrhoids were medium-sized and Grade I (internal       hemorrhoids that do not prolapse).      The exam was otherwise without abnormality on direct and retroflexion       views. Impression:            - Hemorrhoids found on perianal exam.                        - Four 5 to 7 mm polyps in the ascending colon,                         removed with a cold snare. Resected and retrieved.                        - Non-bleeding thrombosed internal hemorrhoids.                        - The examination was otherwise normal on direct and                         retroflexion views. Recommendation:        - Discharge patient to home (with escort).                        - Resume previous diet.                        - Continue present medications.                        - Await pathology results.                        - Repeat colonoscopy for surveillance based on                         pathology results.                        - Return to GI office in  2 weeks.                        - for hemorroidal banding Procedure Code(s):     --- Professional ---                        [phone], Colonoscopy, flexible; with removal of                         tumor(s), polyp(s), or other lesion(s) by snare                         technique Diagnosis Code(s):     --- Professional ---                        K64.0, First degree hemorrhoids                        K64.5, Perianal venous thrombosis                        K63.5, Polyp of colon                        K62.5, Hemorrhage of anus and rectum CPT copyright 2019 American  Medical Association. All rights reserved. The codes documented in this report are preliminary and upon coder review may  be revised to meet current compliance requirements. Jonathon Bellows, MD Jonathon Bellows MD, MD 04/21/2022 9:55:05 AM This report has been signed electronically. Number of Addenda: 0 Note Initiated On: 04/21/2022 9:15 AM Scope Withdrawal Time: 0 hours 8 minutes 6 seconds  Total Procedure Duration: 0 hours 9 minutes 33 seconds  Estimated Blood Loss:  Estimated blood loss: none.      [facility]

## 2022-04-21 NOTE — Transfer of Care (Signed)
Immediate Anesthesia Transfer of Care Note  Patient: Anthony Hale  Procedure(s) Performed: COLONOSCOPY WITH PROPOFOL  Patient Location: Endoscopy Unit  Anesthesia Type:General  Level of Consciousness: drowsy  Airway & Oxygen Therapy: Patient Spontanous Breathing and Patient connected to nasal cannula oxygen  Post-op Assessment: Report given to RN, Post -op Vital signs reviewed and stable and Patient moving all extremities  Post vital signs: Reviewed and stable  Last Vitals:  Vitals Value Taken Time  BP 110/59 04/21/22 0956  Temp 36 C 04/21/22 0955  Pulse 80 04/21/22 0956  Resp 16 04/21/22 0956  SpO2 97 % 04/21/22 0956  Vitals shown include unvalidated device data.  Last Pain:  Vitals:   04/21/22 0955  TempSrc: Tympanic  PainSc: Asleep         Complications: No notable events documented.

## 2022-04-22 ENCOUNTER — Encounter: Payer: Self-pay | Admitting: Gastroenterology

## 2022-04-22 LAB — SURGICAL PATHOLOGY

## 2022-04-29 ENCOUNTER — Ambulatory Visit: Payer: 59 | Admitting: Behavioral Health

## 2022-04-30 ENCOUNTER — Ambulatory Visit: Payer: Managed Care, Other (non HMO) | Admitting: Gastroenterology

## 2022-04-30 ENCOUNTER — Encounter: Payer: Self-pay | Admitting: Gastroenterology

## 2022-05-06 ENCOUNTER — Ambulatory Visit: Payer: Managed Care, Other (non HMO) | Admitting: Gastroenterology

## 2022-05-06 ENCOUNTER — Encounter: Payer: Self-pay | Admitting: Gastroenterology

## 2022-05-06 VITALS — BP 132/84 | HR 69 | Temp 98.5°F | Wt 174.8 lb

## 2022-05-06 DIAGNOSIS — K648 Other hemorrhoids: Secondary | ICD-10-CM

## 2022-05-06 DIAGNOSIS — K602 Anal fissure, unspecified: Secondary | ICD-10-CM

## 2022-05-06 NOTE — Patient Instructions (Signed)
We will send a prescription to [facility] for the cream that Dr. Vicente Males was talking to you about. They will contact you tomorrow when it's ready for pick up.  [facility] Address: [address] [place], [place] [address] Closes 6?PM Phone: [phone]

## 2022-05-06 NOTE — Progress Notes (Signed)
Patient follow-ups today for banding of hemorrhoids    Summary of history :  He was previously seen by [place] back in 2022 for abdominal pain and diarrhea likely secondary to osmotic etiology due to consumption of large quantities of high fructose corn syrup and sodas in his diet.  Symptoms resolved when he cut them down.   01/07/2021: Celiac serology negative, H. pylori breath test negative, IgA levels were low hence requested to check TTG IgG, hemoglobin 15.6, CMP normal, TSH normal, HbA1c normal.  He was seen by Dr. Ancil Boozer on 04/15/2022 for rectal bleeding and discomfort in the anal area.  He says that over the weekend he had some abdominal discomfort subsequently had woken up on Tuesday morning with blood in his undergarments bright red in color no pain associated.  Then resolved and this morning after bowel movement also had bright red blood when he wiped.  Some perianal discomfort.  No prior colonoscopy no family history of colon cancer some weight loss recently.    Interval history 04/16/2022-05/06/2022  At his last visit had a burst hemorrhoid on examination   Treated conservatively  04/21/2022: Colonoscopy : hemorrhoids and 4 sessile polyps resected x tubilar adenomas     Would like to have them banded today.  Since his last visit no rectal bleeding feels better.    Digital rectal exam performed in the presence of a chaperone. External anal findings: Externally no abnormalities Internal findings: He had discomfort while inserting my index finger and it was too painful to proceed, No masses, no blood on glove noticed.    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  Unable to proceed with banding today no prolapsed hemorrhoids seen.  Plan:  We will send prescription for compounded muscle relaxer to be applied topically in the perianal area to help the anal fissure heal and we will plan to band his hemorrhoids in 4  weeks  Follow-up:4 weeks  Dr Jonathon Bellows MD,MRCP [facility]) Gastroenterology/Hepatology Pager: [phone]

## 2022-05-26 ENCOUNTER — Ambulatory Visit: Payer: Managed Care, Other (non HMO) | Admitting: Gastroenterology

## 2022-06-01 ENCOUNTER — Ambulatory Visit: Payer: 59 | Admitting: Behavioral Health

## 2022-06-03 NOTE — Progress Notes (Unsigned)
Name: Anthony Hale   MRN: [id number]    DOB: [birth year]   Date:06/04/2022       Progress Note  Subjective  Chief Complaint  Annual Exam  HPI  Patient presents for annual CPE.  Diet: eating out often, almost daily  Exercise: discussed 150 minutes per week, he likes to walk, advised to do it daily and try to go to a park on weekend  Last Dental Exam:due for an eye exam  Last Eye Exam: up to date   Depression: phq 9 is negative    06/04/2022    7:46 AM 04/15/2022   10:27 AM 03/12/2022   11:10 AM 12/31/2021    8:11 AM 10/21/2021    8:09 AM  Depression screen PHQ 2/9  Decreased Interest 0 0 1 0 0  Down, Depressed, Hopeless 0 0 0 0 1  PHQ - 2 Score 0 0 1 0 1  Altered sleeping '3  2  3  '$ Tired, decreased energy '3  3  3  '$ Change in appetite 0  1  0  Feeling bad or failure about yourself  0  0  0  Trouble concentrating 0  1  1  Moving slowly or fidgety/restless 0  0  0  Suicidal thoughts 0  0  0  PHQ-9 Score '6  8  8  '$ Difficult doing work/chores     Somewhat difficult    Hypertension:  BP Readings from Last 3 Encounters:  06/04/22 120/72  05/06/22 132/84  04/21/22 114/72    Obesity: Wt Readings from Last 3 Encounters:  06/04/22 177 lb (80.3 kg)  05/06/22 174 lb 12.8 oz (79.3 kg)  04/21/22 163 lb 9.6 oz (74.2 kg)   BMI Readings from Last 3 Encounters:  06/04/22 29.45 kg/m  05/06/22 29.09 kg/m  04/21/22 27.22 kg/m     Lipids:  Lab Results  Component Value Date   CHOL 205 (H) 06/03/2021   CHOL 213 (H) 05/29/2020   CHOL 212 (H) 10/09/2019   Lab Results  Component Value Date   HDL 38 (L) 06/03/2021   HDL 36 (L) 05/29/2020   HDL 34 (L) 10/09/2019   Lab Results  Component Value Date   LDLCALC 141 (H) 06/03/2021   LDLCALC 151 (H) 05/29/2020   LDLCALC 132 (H) 10/09/2019   Lab Results  Component Value Date   TRIG 135 06/03/2021   TRIG 136 05/29/2020   TRIG 257 (H) 10/09/2019   Lab Results  Component Value Date   CHOLHDL 5.4 (H) 06/03/2021   CHOLHDL 5.9  (H) 05/29/2020   CHOLHDL 6.2 (H) 10/09/2019   No results found for: "LDLDIRECT" Glucose:  Glucose, Bld  Date Value Ref Range Status  12/31/2021 97 65 - 99 mg/dL Final    Comment:    .            Fasting reference interval .   06/03/2021 95 65 - 99 mg/dL Final    Comment:    .            Fasting reference interval .   05/29/2020 100 (H) 65 - 99 mg/dL Final    Comment:    .            Fasting reference interval . For someone without known diabetes, a glucose value between 100 and 125 mg/dL is consistent with prediabetes and should be confirmed with a follow-up test. .     [place] Office Visit from 12/31/2021 in [facility]  AUDIT-C Score 0      Divorced, currently dating  STD testing and prevention (HIV/chl/gon/syphilis): 12/31/21 Sexual history: not sexually active yet, uses condoms when sexually active  Hep C Screening: 12/31/21 Skin cancer: Discussed monitoring for atypical lesions Colorectal cancer: 04/21/22 Prostate cancer:  discussed USPTF    Lung cancer:  Low Dose CT Chest recommended if Age 15-80 years, 30 pack-year currently smoking OR have quit w/in 15years. Patient  no a candidate for screening   AAA: The USPSTF recommends one-time screening with ultrasonography in men ages 27 to 4 years who have ever smoked. Patient   no, a candidate for screening  ECG:  11/02/09  Vaccines:   HPV: N/A Tdap: up to date Shingrix: N/A Pneumonia: N/A Flu: up to date  COVID-19: discussed booster   Advanced Care Planning: A voluntary discussion about advance care planning including the explanation and discussion of advance directives.  Discussed health care proxy and Living will, and the patient was able to identify a health care proxy as parents .  Patient has  a living will and power of attorney of health care   Patient Active Problem List   Diagnosis Date Noted   Rectal bleeding    Adenomatous polyp of colon    Lateral epicondylitis, left  elbow 03/18/2020   Encounter for biometric screening 10/09/2019   Depression, major, recurrent, mild ([place]) 08/28/2015   Generalized anxiety disorder 08/28/2015   IBS (irritable bowel syndrome) 04/03/2015   Fatty tumor 12/31/2014   Decorative tattoo 12/31/2014   Asthma, cough variant 12/30/2014   Insomnia 12/30/2014   GERD (gastroesophageal reflux disease) 12/30/2014   Perennial allergic rhinitis with seasonal variation 05/23/2007    Past Surgical History:  Procedure Laterality Date   COLONOSCOPY WITH PROPOFOL N/A 04/21/2022   Procedure: COLONOSCOPY WITH PROPOFOL;  Surgeon: Jonathon Bellows, MD;  Location: [facility] ENDOSCOPY;  Service: Gastroenterology;  Laterality: N/A;   WISDOM TOOTH EXTRACTION      Family History  Problem Relation Age of Onset   Cancer Mother        Breast   Hyperlipidemia Mother    Heart disease Father    Hyperlipidemia Father    Hypertension Father     Social History   Socioeconomic History   Marital status: Divorced    Spouse name: Not on file   Number of children: 0   Years of education: Not on file   Highest education level: Master's degree (e.g., MA, MS, MEng, MEd, MSW, MBA)  Occupational History   Occupation: Quarry manager  Tobacco Use   Smoking status: Never   Smokeless tobacco: Never  Vaping Use   Vaping Use: Never used  Substance and Sexual Activity   Alcohol use: Yes    Alcohol/week: 0.0 standard drinks of alcohol    Comment: rarely   Drug use: No   Sexual activity: Yes    Partners: Female  Other Topics Concern   Not on file  Social History Narrative   Married on March 19th, 2017, marriage was never consummated and she left on June 25 th, 2017   Divorced Summer 2018   Still work as a Nutritional therapist for [employer]   He has a Production assistant, radio in Energy manager and two masters ( Conservator, museum/gallery and criminal justice administration)    Currently Living in [place] Strain: [place]  (06/04/2022)   Overall Financial Resource Strain (CARDIA)    Difficulty of  Paying Living Expenses: Not very hard  Food Insecurity: No Food Insecurity (06/04/2022)   Hunger Vital Sign    Worried About Running Out of Food in the Last Year: Never true    Ran Out of Food in the Last Year: Never true  Transportation Needs: No Transportation Needs (06/04/2022)   PRAPARE - Hydrologist (Medical): No    Lack of Transportation (Non-Medical): No  Physical Activity: Insufficiently Active (06/04/2022)   Exercise Vital Sign    Days of Exercise per Week: 2 days    Minutes of Exercise per Session: 20 min  Stress: Stress Concern Present (06/04/2022)   [place]    Feeling of Stress : Very much  Social Connections: Moderately Integrated (06/04/2022)   Social Connection and Isolation Panel [NHANES]    Frequency of Communication with Friends and Family: More than three times a week    Frequency of Social Gatherings with Friends and Family: More than three times a week    Attends Religious Services: More than 4 times per year    Active Member of [employer] or Organizations: Yes    Attends Music therapist: More than 4 times per year    Marital Status: Divorced  Intimate Partner Violence: Not At Risk (06/04/2022)   Humiliation, Afraid, Rape, and Kick questionnaire    Fear of Current or Ex-Partner: No    Emotionally Abused: No    Physically Abused: No    Sexually Abused: No     Current Outpatient Medications:    albuterol (VENTOLIN HFA) 108 (90 Base) MCG/ACT inhaler, Inhale 2 puffs into the lungs every 6 (six) hours as needed for wheezing or shortness of breath., Disp: 18 g, Rfl: 0   propranolol (INDERAL) 10 MG tablet, Take 1 tablet (10 mg total) by mouth 3 (three) times daily., Disp: 90 tablet, Rfl: 3   traZODone (DESYREL) 50 MG tablet, TAKE 1 TABLET BY  MOUTH EVERYDAY AT BEDTIME, Disp: 90 tablet, Rfl: 2   Vilazodone HCl 20 MG TABS, TAKE 1 TABLET BY MOUTH EVERY DAY, Disp: 90 tablet, Rfl: 0   fluticasone (FLONASE) 50 MCG/ACT nasal spray, Place 2 sprays into both nostrils daily. (Patient not taking: Reported on 06/04/2022), Disp: 16 g, Rfl: 6  No Known Allergies   ROS  Constitutional: Negative for fever , positive weight change.  Respiratory: Negative for cough and shortness of breath.   Cardiovascular: Negative for chest pain or palpitations.  Gastrointestinal: Negative for abdominal pain, no bowel changes.  Musculoskeletal: Negative for gait problem or joint swelling.  Skin: Negative for rash.  Neurological: Negative for dizziness or headache.  No other specific complaints in a complete review of systems (except as listed in HPI above).    Objective  Vitals:   06/04/22 0747  BP: 120/72  Pulse: 97  Resp: 16  SpO2: 99%  Weight: 177 lb (80.3 kg)  Height: '5\' 5"'$  (1.651 m)    Body mass index is 29.45 kg/m.  Physical Exam  Constitutional: Patient appears well-developed and well-nourished. No distress.  HENT: Head: Normocephalic and atraumatic. Ears: B TMs ok, no erythema or effusion; Nose: Nose normal. Mouth/Throat: Oropharynx is clear and moist. No oropharyngeal exudate.  Eyes: Conjunctivae and EOM are normal. Pupils are equal, round, and reactive to light. No scleral icterus.  Neck: Normal range of motion. Neck supple. No JVD present. No thyromegaly present.  Cardiovascular: Normal rate, regular rhythm and normal heart sounds.  No  murmur heard. No BLE edema. Pulmonary/Chest: Effort normal and breath sounds normal. No respiratory distress. Abdominal: Soft. Bowel sounds are normal, no distension. There is no tenderness. no masses MALE GENITALIA: Normal descended testes bilaterally, no masses palpated, no hernias, no lesions, no discharge RECTAL: not done  Musculoskeletal: Normal range of motion, no joint effusions. No gross  deformities Neurological: he is alert and oriented to person, place, and time. No cranial nerve deficit. Coordination, balance, strength, speech and gait are normal.  Skin: Skin is warm and dry. No rash noted. No erythema.  Psychiatric: Patient has a normal mood and affect. behavior is normal. Judgment and thought content normal.    Recent Results (from the past 2160 hour(s))  POCT rapid strep A     Status: None   Collection Time: 03/12/22 11:08 AM  Result Value Ref Range   Rapid Strep A Screen Negative Negative  CBC with Differential/Platelet     Status: None   Collection Time: 04/15/22 10:50 AM  Result Value Ref Range   WBC 5.9 3.8 - 10.8 Thousand/uL   RBC 5.09 4.20 - 5.80 Million/uL   Hemoglobin 15.5 13.2 - 17.1 g/dL   HCT 45.1 38.5 - 50.0 %   MCV 88.6 80.0 - 100.0 fL   MCH 30.5 27.0 - 33.0 pg   MCHC 34.4 32.0 - 36.0 g/dL   RDW 12.8 11.0 - 15.0 %   Platelets 243 140 - 400 Thousand/uL   MPV 10.0 7.5 - 12.5 fL   Neutro Abs 3,617 1,500 - 7,800 cells/uL   Lymphs Abs 1,558 850 - 3,900 cells/uL   Absolute Monocytes 549 200 - 950 cells/uL   Eosinophils Absolute 148 15 - 500 cells/uL   Basophils Absolute 30 0 - 200 cells/uL   Neutrophils Relative % 61.3 %   Total Lymphocyte 26.4 %   Monocytes Relative 9.3 %   Eosinophils Relative 2.5 %   Basophils Relative 0.5 %  Surgical pathology     Status: None   Collection Time: 04/21/22  9:45 AM  Result Value Ref Range   SURGICAL PATHOLOGY      SURGICAL PATHOLOGY CASE: [id number] PATIENT: Anthony Hale Surgical Pathology Report     Specimen Submitted: A. Colon polyp x4, ascending; cold snare  Clinical History: Rectal bleeding K62.5.  Colon polyps      DIAGNOSIS: A.  COLON POLYP X 4, ASCENDING; COLD SNARE: - TUBULAR ADENOMA (MULTIPLE FRAGMENTS). - NEGATIVE FOR HIGH-GRADE DYSPLASIA AND MALIGNANCY.  GROSS DESCRIPTION: A. Labeled: Cold snare polyp x 4 ascending colon Received: Formalin Collection time: 9:45 AM on  04/21/2022 Placed into formalin time: 9:45 AM on 04/21/2022 Tissue fragment(s): Multiple Size: Aggregate, 2 x 0.9 x 0.1 cm Description: Tan and red soft tissue fragments Entirely submitted in 1 cassette.  RB 04/21/2022  Final Diagnosis performed by Quay Burow, MD.   Electronically signed 04/22/2022 12:22:28PM The electronic signature indicates that the named Attending Pathologist has evaluated the specimen Technical component performed at [facility], [address] [place], [place] [address] Lab: [phone] Dir: Rush Farmer, MD, MMM  Professional component performed at [facility], [facility], [place], [place], [place] [address] Lab: [phone] Dir: Kathi Simpers, MD      Fall Risk:    06/04/2022    7:46 AM 04/15/2022   10:27 AM 03/12/2022   11:10 AM 12/31/2021    8:10 AM 10/21/2021    8:08 AM  Fall Risk   Falls in the past year? 0 0 0 0 1  Number falls in past yr: 0 0 0 0 0  Injury with Fall? 0 0 0 0 0  Risk for fall due to : No Fall Risks No Fall Risks No Fall Risks  Impaired balance/gait  Follow up Falls prevention discussed Falls prevention discussed Falls prevention discussed Falls evaluation completed Falls prevention discussed;Education provided     Functional Status Survey: Is the patient deaf or have difficulty hearing?: No Does the patient have difficulty seeing, even when wearing glasses/contacts?: No Does the patient have difficulty concentrating, remembering, or making decisions?: Yes Does the patient have difficulty walking or climbing stairs?: No Does the patient have difficulty dressing or bathing?: No Does the patient have difficulty doing errands alone such as visiting a doctor's office or shopping?: No    Assessment & Plan  1. Well adult exam  - Lipid panel - Hemoglobin A1c - HIV Antibody (routine testing w rflx)  2. Encounter for screening for HIV  - HIV Antibody (routine testing w rflx)  3. Dyslipidemia  - Lipid  panel  4. Diabetes mellitus screening  - Hemoglobin A1c     -Prostate cancer screening and PSA options (with potential risks and benefits of testing vs not testing) were discussed along with recent recs/guidelines. -USPSTF grade A and B recommendations reviewed with patient; age-appropriate recommendations, preventive care, screening tests, etc discussed and encouraged; healthy living encouraged; see AVS for patient education given to patient -Discussed importance of 150 minutes of physical activity weekly, eat two servings of fish weekly, eat one serving of tree nuts ( cashews, pistachios, pecans, almonds.[place] Kitchen) every other day, eat 6 servings of fruit/vegetables daily and drink plenty of water and avoid sweet beverages.  -Reviewed Health Maintenance: yes

## 2022-06-03 NOTE — Patient Instructions (Signed)
Preventive Care 21-39 Years Old, Male Preventive care refers to lifestyle choices and visits with your health care provider that can promote health and wellness. Preventive care visits are also called wellness exams. What can I expect for my preventive care visit? Counseling During your preventive care visit, your health care provider may ask about your: Medical history, including: Past medical problems. Family medical history. Current health, including: Emotional well-being. Home life and relationship well-being. Sexual activity. Lifestyle, including: Alcohol, nicotine or tobacco, and drug use. Access to firearms. Diet, exercise, and sleep habits. Safety issues such as seatbelt and bike helmet use. Sunscreen use. Work and work environment. Physical exam Your health care provider may check your: Height and weight. These may be used to calculate your BMI (body mass index). BMI is a measurement that tells if you are at a healthy weight. Waist circumference. This measures the distance around your waistline. This measurement also tells if you are at a healthy weight and may help predict your risk of certain diseases, such as type 2 diabetes and high blood pressure. Heart rate and blood pressure. Body temperature. Skin for abnormal spots. What immunizations do I need?  Vaccines are usually given at various ages, according to a schedule. Your health care provider will recommend vaccines for you based on your age, medical history, and lifestyle or other factors, such as travel or where you work. What tests do I need? Screening Your health care provider may recommend screening tests for certain conditions. This may include: Lipid and cholesterol levels. Diabetes screening. This is done by checking your blood sugar (glucose) after you have not eaten for a while (fasting). Hepatitis B test. Hepatitis C test. HIV (human immunodeficiency virus) test. STI (sexually transmitted infection)  testing, if you are at risk. Talk with your health care provider about your test results, treatment options, and if necessary, the need for more tests. Follow these instructions at home: Eating and drinking  Eat a healthy diet that includes fresh fruits and vegetables, whole grains, lean protein, and low-fat dairy products. Drink enough fluid to keep your urine pale yellow. Take vitamin and mineral supplements as recommended by your health care provider. Do not drink alcohol if your health care provider tells you not to drink. If you drink alcohol: Limit how much you have to 0-2 drinks a day. Know how much alcohol is in your drink. In the [place] one drink equals one 12 oz bottle of beer (355 mL), one 5 oz glass of wine (148 mL), or one 1 oz glass of hard liquor (44 mL). Lifestyle Brush your teeth every morning and night with fluoride toothpaste. Floss one time each day. Exercise for at least 30 minutes 5 or more days each week. Do not use any products that contain nicotine or tobacco. These products include cigarettes, chewing tobacco, and vaping devices, such as e-cigarettes. If you need help quitting, ask your health care provider. Do not use drugs. If you are sexually active, practice safe sex. Use a condom or other form of protection to prevent STIs. Find healthy ways to manage stress, such as: Meditation, yoga, or listening to music. Journaling. Talking to a trusted person. Spending time with friends and family. Minimize exposure to UV radiation to reduce your risk of skin cancer. Safety Always wear your seat belt while driving or riding in a vehicle. Do not drive: If you have been drinking alcohol. Do not ride with someone who has been drinking. If you have been using any mind-altering substances   or drugs. While texting. When you are tired or distracted. Wear a helmet and other protective equipment during sports activities. If you have firearms in your house, make sure you  follow all gun safety procedures. Seek help if you have been physically or sexually abused. What's next? Go to your health care provider once a year for an annual wellness visit. Ask your health care provider how often you should have your eyes and teeth checked. Stay up to date on all vaccines. This information is not intended to replace advice given to you by your health care provider. Make sure you discuss any questions you have with your health care provider. Document Revised: 01/01/2021 Document Reviewed: 01/01/2021 Elsevier Patient Education  2023 Elsevier Inc.  

## 2022-06-04 ENCOUNTER — Encounter: Payer: Self-pay | Admitting: Family Medicine

## 2022-06-04 ENCOUNTER — Ambulatory Visit (INDEPENDENT_AMBULATORY_CARE_PROVIDER_SITE_OTHER): Payer: Managed Care, Other (non HMO) | Admitting: Family Medicine

## 2022-06-04 VITALS — BP 120/72 | HR 97 | Resp 16 | Ht 65.0 in | Wt 177.0 lb

## 2022-06-04 DIAGNOSIS — Z114 Encounter for screening for human immunodeficiency virus [HIV]: Secondary | ICD-10-CM | POA: Diagnosis not present

## 2022-06-04 DIAGNOSIS — E785 Hyperlipidemia, unspecified: Secondary | ICD-10-CM | POA: Diagnosis not present

## 2022-06-04 DIAGNOSIS — Z0000 Encounter for general adult medical examination without abnormal findings: Secondary | ICD-10-CM

## 2022-06-04 DIAGNOSIS — Z131 Encounter for screening for diabetes mellitus: Secondary | ICD-10-CM | POA: Diagnosis not present

## 2022-06-05 LAB — HEMOGLOBIN A1C
Hgb A1c MFr Bld: 5.5 % of total Hgb (ref <5.7)
Mean Plasma Glucose: 111 mg/dL
eAG (mmol/L): 6.2 mmol/L

## 2022-06-05 LAB — LIPID PANEL
Cholesterol: 177 mg/dL (ref <200)
HDL: 34 mg/dL — ABNORMAL LOW (ref >=40)
LDL Cholesterol (Calc): 125 mg/dL (calc) — ABNORMAL HIGH
Non-HDL Cholesterol (Calc): 143 mg/dL (calc) — ABNORMAL HIGH (ref <130)
Total CHOL/HDL Ratio: 5.2 (calc) — ABNORMAL HIGH (ref <5.0)
Triglycerides: 79 mg/dL (ref <150)

## 2022-06-05 LAB — HIV ANTIBODY (ROUTINE TESTING W REFLEX): HIV 1&2 Ab, 4th Generation: NONREACTIVE

## 2022-06-08 ENCOUNTER — Ambulatory Visit: Payer: Managed Care, Other (non HMO) | Admitting: Gastroenterology

## 2022-06-11 ENCOUNTER — Other Ambulatory Visit: Payer: Self-pay | Admitting: Behavioral Health

## 2022-06-11 DIAGNOSIS — F422 Mixed obsessional thoughts and acts: Secondary | ICD-10-CM

## 2022-06-11 DIAGNOSIS — F411 Generalized anxiety disorder: Secondary | ICD-10-CM

## 2022-06-15 NOTE — Telephone Encounter (Signed)
Has an appt with Aaron Edelman tomorrow.

## 2022-06-16 ENCOUNTER — Ambulatory Visit: Payer: 59 | Admitting: Behavioral Health

## 2022-07-10 ENCOUNTER — Other Ambulatory Visit: Payer: Self-pay

## 2022-07-10 ENCOUNTER — Encounter: Payer: Self-pay | Admitting: Nurse Practitioner

## 2022-07-10 ENCOUNTER — Ambulatory Visit: Payer: Managed Care, Other (non HMO) | Admitting: Nurse Practitioner

## 2022-07-10 VITALS — BP 118/72 | HR 88 | Temp 98.7°F | Resp 18 | Ht 65.0 in | Wt 184.2 lb

## 2022-07-10 DIAGNOSIS — J039 Acute tonsillitis, unspecified: Secondary | ICD-10-CM

## 2022-07-10 DIAGNOSIS — J069 Acute upper respiratory infection, unspecified: Secondary | ICD-10-CM | POA: Diagnosis not present

## 2022-07-10 LAB — POCT RAPID STREP A (OFFICE): Rapid Strep A Screen: NEGATIVE

## 2022-07-10 MED ORDER — HYDROCOD POLI-CHLORPHE POLI ER 10-8 MG/5ML PO SUER: 5.0000 mL | Freq: Two times a day (BID) | ORAL | 0 refills | Status: DC | PRN

## 2022-07-10 MED ORDER — BENZONATATE 100 MG PO CAPS: 200.0000 mg | ORAL_CAPSULE | Freq: Two times a day (BID) | ORAL | 0 refills | Status: DC | PRN

## 2022-07-10 MED ORDER — AMOXICILLIN 500 MG PO CAPS: 500.0000 mg | ORAL_CAPSULE | Freq: Two times a day (BID) | ORAL | 0 refills | Status: AC

## 2022-07-10 NOTE — Progress Notes (Signed)
BP 118/72   Pulse 88   Temp 98.7 F (37.1 C) (Oral)   Resp 18   Ht '5\' 5"'$  (1.651 m)   Wt 184 lb 3.2 oz (83.6 kg)   SpO2 98%   BMI 30.65 kg/m    Subjective:    Patient ID: Anthony Hale, male    DOB: [birth year], 36 y.o.   MRN: [id number]  HPI: Anthony Hale is a 36 y.o. male  Chief Complaint  Patient presents with   Sore Throat    Cough, congestion, runny nose for 1 week   URI: patient reports symptoms have been going on for about a week. Symptoms include, cough, congestion, runny nose and sore throat.  He denies any fever or shortness of breath. Patient reports he has taken mucinex. Did rapid strep which was negative, however will treat due to clinical presentation.  Recommend taking zyrtec, flonase and mucinex. Vitamin D, C and Zinc.  Push fluids and get rest.  Will send in tessalon perls, tussinex and amoxicillin.  Asthma:  patient reports his breathing has been fine. He does have an albuterol inhaler to use if needed.   Relevant past medical, surgical, family and social history reviewed and updated as indicated. Interim medical history since our last visit reviewed. Allergies and medications reviewed and updated.  Review of Systems  Constitutional: Negative for fever or weight change.  HEENT: positive for nasal congestion, sore throat, and runny nose Respiratory: positive  for cough and negative for shortness of breath.   Cardiovascular: Negative for chest pain or palpitations.  Gastrointestinal: Negative for abdominal pain, no bowel changes.  Musculoskeletal: Negative for gait problem or joint swelling.  Skin: Negative for rash.  Neurological: Negative for dizziness or headache.  No other specific complaints in a complete review of systems (except as listed in HPI above).      Objective:    BP 118/72   Pulse 88   Temp 98.7 F (37.1 C) (Oral)   Resp 18   Ht '5\' 5"'$  (1.651 m)   Wt 184 lb 3.2 oz (83.6 kg)   SpO2 98%   BMI 30.65 kg/m   Wt Readings from Last 3  Encounters:  07/10/22 184 lb 3.2 oz (83.6 kg)  06/04/22 177 lb (80.3 kg)  05/06/22 174 lb 12.8 oz (79.3 kg)    Physical Exam  Constitutional: Patient appears well-developed and well-nourished. Obese  No distress.  HEENT: head atraumatic, normocephalic, pupils equal and reactive to light, ears TMs clear, neck supple, throat erythematous with exudate, right side lymphadenopathy Cardiovascular: Normal rate, regular rhythm and normal heart sounds.  No murmur heard. No BLE edema. Pulmonary/Chest: Effort normal and breath sounds normal. No respiratory distress. Abdominal: Soft.  There is no tenderness. Psychiatric: Patient has a normal mood and affect. behavior is normal. Judgment and thought content normal.   Results for orders placed or performed in visit on 07/10/22  POCT rapid strep A  Result Value Ref Range   Rapid Strep A Screen Negative Negative      Assessment & Plan:   Problem List Items Addressed This Visit   None Visit Diagnoses     Viral upper respiratory tract infection    -  Primary   zyrtec, flonase and mucinex. Vitamin D, C and Zinc.  Push fluids and get rest.  Will send in tessalon perls, tussinex and amoxicillin.   Relevant Medications   benzonatate (TESSALON) 100 MG capsule   chlorpheniramine-HYDROcodone (TUSSIONEX) 10-8 MG/5ML   Other Relevant  Orders   POCT rapid strep A (Completed)   Tonsillitis       zyrtec, flonase and mucinex. Vitamin D, C and Zinc.  Push fluids and get rest.  Will send in tessalon perls, tussinex and amoxicillin.   Relevant Medications   amoxicillin (AMOXIL) 500 MG capsule        Follow up plan: Return if symptoms worsen or fail to improve.

## 2022-07-21 ENCOUNTER — Ambulatory Visit: Payer: 59 | Admitting: Behavioral Health

## 2022-07-31 ENCOUNTER — Encounter: Payer: Self-pay | Admitting: Behavioral Health

## 2022-07-31 ENCOUNTER — Ambulatory Visit: Payer: 59 | Admitting: Behavioral Health

## 2022-07-31 DIAGNOSIS — F411 Generalized anxiety disorder: Secondary | ICD-10-CM

## 2022-07-31 DIAGNOSIS — F422 Mixed obsessional thoughts and acts: Secondary | ICD-10-CM | POA: Diagnosis not present

## 2022-07-31 MED ORDER — VILAZODONE HCL 20 MG PO TABS: 1.0000 | ORAL_TABLET | Freq: Every day | ORAL | 0 refills | Status: DC

## 2022-07-31 NOTE — Progress Notes (Signed)
[facility]  Patient ID: Anthony Hale,  MRN: [id number]  PCP: Steele Sizer, MD  Date of Evaluation: 07/31/2022 Time spent:30 minutes  Chief Complaint:   HISTORY/CURRENT STATUS: HPI  37 year old male presents to this office for follow up and medication management. Says he has been doing well overall. He had a colonoscopy recently and stopped his medication. Says he has problems remembering to take his meds everyday. His work environment has improved a little bit. He is in new relationship. Been dating woman for 4 months. He does not want to adjust his medications at this time.  Anxiety today is 3/10 and depression 2/10. 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 He is getting 6-7 hours per night.  He denies mania, no psychosis, no SI/HI.   Past psychiatric medication trials: Wellbutrin Trazodone Prozac     Individual Medical History/ Review of Systems: Changes? :No   Allergies: Patient has no known allergies.  Current Medications:  Current Outpatient Medications:    albuterol (VENTOLIN HFA) 108 (90 Base) MCG/ACT inhaler, Inhale 2 puffs into the lungs every 6 (six) hours as needed for wheezing or shortness of breath., Disp: 18 g, Rfl: 0   benzonatate (TESSALON) 100 MG capsule, Take 2 capsules (200 mg total) by mouth 2 (two) times daily as needed for cough., Disp: 20 capsule, Rfl: 0   chlorpheniramine-HYDROcodone (TUSSIONEX) 10-8 MG/5ML, Take 5 mLs by mouth every 12 (twelve) hours as needed for cough., Disp: 115 mL, Rfl: 0   propranolol (INDERAL) 10 MG tablet, Take 1 tablet (10 mg total) by mouth 3 (three) times daily., Disp: 90 tablet, Rfl: 3   traZODone (DESYREL) 50 MG tablet, TAKE 1 TABLET BY MOUTH EVERYDAY AT BEDTIME, Disp: 90 tablet, Rfl: 2   Vilazodone HCl 20 MG TABS, Take 1 tablet (20 mg total) by mouth daily., Disp: 90 tablet, Rfl: 0 Medication Side Effects: none  Family Medical/ Social History: Changes? No  MENTAL  HEALTH EXAM:  There were no vitals taken for this visit.There is no height or weight on file to calculate BMI.  General Appearance: Casual, Neat, and Well Groomed  Eye Contact:  Good  Speech:  Clear and Coherent  Volume:  Normal  Mood:  Depressed  Affect:  Appropriate and Depressed  Thought Process:  Coherent  Orientation:  Full (Time, Place, and Person)  Thought Content: Logical   Suicidal Thoughts:  No  Homicidal Thoughts:  No  Memory:  WNL  Judgement:  Good  Insight:  Good  Psychomotor Activity:  Normal  Concentration:  Concentration: Good  Recall:  Good  Fund of Knowledge: Good  Language: Good  Assets:  Desire for Improvement  ADL's:  Intact  Cognition: WNL  Prognosis:  Good    DIAGNOSES:    ICD-10-CM   1. Generalized anxiety disorder  F41.1 Vilazodone HCl 20 MG TABS    2. Mixed obsessional thoughts and acts  F42.2 Vilazodone HCl 20 MG TABS      Receiving Psychotherapy: No    RECOMMENDATIONS:   Reduce Viibryd to 20 mg daily.  To continue Trazodone 50 mg for sleep. 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  To report any side effects or worsening symptoms promptly Will follow up in 2 months to reassess Reinforced taking medications exactly as prescribed and reviewed good sleep hygiene.  Provided emergency contact information Greater than 50% of  30 min face to face time with patient was spent on counseling and coordination of care. He says his work situation has been slowly improving.  Reviewed PDMP   Elwanda Brooklyn, NP

## 2022-09-29 ENCOUNTER — Ambulatory Visit: Payer: 59 | Admitting: Behavioral Health

## 2022-10-19 ENCOUNTER — Ambulatory Visit: Payer: 59 | Admitting: Behavioral Health

## 2022-10-20 ENCOUNTER — Other Ambulatory Visit: Payer: Self-pay | Admitting: Family Medicine

## 2022-10-20 ENCOUNTER — Ambulatory Visit: Payer: Managed Care, Other (non HMO) | Admitting: Family Medicine

## 2022-10-20 ENCOUNTER — Encounter: Payer: Self-pay | Admitting: Family Medicine

## 2022-10-20 VITALS — BP 118/78 | HR 56 | Temp 98.4°F | Ht 65.0 in | Wt 179.0 lb

## 2022-10-20 DIAGNOSIS — K469 Unspecified abdominal hernia without obstruction or gangrene: Secondary | ICD-10-CM

## 2022-10-20 DIAGNOSIS — R7989 Other specified abnormal findings of blood chemistry: Secondary | ICD-10-CM

## 2022-10-20 DIAGNOSIS — E663 Overweight: Secondary | ICD-10-CM | POA: Diagnosis not present

## 2022-10-20 DIAGNOSIS — D122 Benign neoplasm of ascending colon: Secondary | ICD-10-CM | POA: Diagnosis not present

## 2022-10-20 DIAGNOSIS — K625 Hemorrhage of anus and rectum: Secondary | ICD-10-CM

## 2022-10-20 DIAGNOSIS — J45991 Cough variant asthma: Secondary | ICD-10-CM | POA: Diagnosis not present

## 2022-10-20 DIAGNOSIS — F39 Unspecified mood [affective] disorder: Secondary | ICD-10-CM

## 2022-10-20 LAB — COMPREHENSIVE METABOLIC PANEL
ALT: 42 U/L (ref 0–53)
AST: 21 U/L (ref 0–37)
Albumin: 4.6 g/dL (ref 3.5–5.2)
Alkaline Phosphatase: 66 U/L (ref 39–117)
BUN: 12 mg/dL (ref 6–23)
CO2: 29 mEq/L (ref 19–32)
Calcium: 9.3 mg/dL (ref 8.4–10.5)
Chloride: 104 mEq/L (ref 96–112)
Creatinine, Ser: 1 mg/dL (ref 0.40–1.50)
GFR: 95.08 mL/min (ref >60.00)
Glucose, Bld: 97 mg/dL (ref 70–99)
Potassium: 4.5 mEq/L (ref 3.5–5.1)
Sodium: 136 mEq/L (ref 135–145)
Total Bilirubin: 0.7 mg/dL (ref 0.2–1.2)
Total Protein: 6.8 g/dL (ref 6.0–8.3)

## 2022-10-20 LAB — CBC WITH DIFFERENTIAL/PLATELET
Basophils Absolute: 0 10*3/uL (ref 0.0–0.1)
Basophils Relative: 0.8 % (ref 0.0–3.0)
Eosinophils Absolute: 0.1 10*3/uL (ref 0.0–0.7)
Eosinophils Relative: 2.2 % (ref 0.0–5.0)
HCT: 44.8 % (ref 39.0–52.0)
Hemoglobin: 15.4 g/dL (ref 13.0–17.0)
Lymphocytes Relative: 24.4 % (ref 12.0–46.0)
Lymphs Abs: 1.3 10*3/uL (ref 0.7–4.0)
MCHC: 34.5 g/dL (ref 30.0–36.0)
MCV: 87.3 fl (ref 78.0–100.0)
Monocytes Absolute: 0.6 10*3/uL (ref 0.1–1.0)
Monocytes Relative: 10.9 % (ref 3.0–12.0)
Neutro Abs: 3.4 10*3/uL (ref 1.4–7.7)
Neutrophils Relative %: 61.7 % (ref 43.0–77.0)
Platelets: 265 10*3/uL (ref 150.0–400.0)
RBC: 5.13 Mil/uL (ref 4.22–5.81)
RDW: 13.2 % (ref 11.5–15.5)
WBC: 5.5 10*3/uL (ref 4.0–10.5)

## 2022-10-20 LAB — LIPID PANEL
Cholesterol: 187 mg/dL (ref 0–200)
HDL: 33.4 mg/dL — ABNORMAL LOW (ref >39.00)
LDL Cholesterol: 115 mg/dL — ABNORMAL HIGH (ref 0–99)
NonHDL: 153.1
Total CHOL/HDL Ratio: 6
Triglycerides: 189 mg/dL — ABNORMAL HIGH (ref 0.0–149.0)
VLDL: 37.8 mg/dL (ref 0.0–40.0)

## 2022-10-20 LAB — TSH: TSH: 0.98 u[IU]/mL (ref 0.35–5.50)

## 2022-10-20 LAB — HEMOGLOBIN A1C: Hgb A1c MFr Bld: 5.5 % (ref 4.6–6.5)

## 2022-10-20 LAB — VITAMIN B12: Vitamin B-12: 175 pg/mL — ABNORMAL LOW (ref 211–911)

## 2022-10-20 LAB — VITAMIN D 25 HYDROXY (VIT D DEFICIENCY, FRACTURES): VITD: 18.85 ng/mL — ABNORMAL LOW (ref 30.00–100.00)

## 2022-10-20 MED ORDER — VITAMIN B-12 1000 MCG SL SUBL
2000.0000 ug | SUBLINGUAL_TABLET | Freq: Every day | SUBLINGUAL | 3 refills | Status: DC
Start: 2022-10-20 — End: 2023-06-23

## 2022-10-20 MED ORDER — VITAMIN D (ERGOCALCIFEROL) 1.25 MG (50000 UNIT) PO CAPS
50000.0000 [IU] | ORAL_CAPSULE | ORAL | 3 refills | Status: AC
Start: 2022-10-20 — End: ?

## 2022-10-20 NOTE — Patient Instructions (Addendum)
It was a pleasure meeting you today. Thank you for allowing me to take part in your health care.  Our goals for today as we discussed include:  We will get some labs today.  If they are abnormal or we need to do something about them, I will call you.  If they are normal, I will send you a message on MyChart (if it is active) or a letter in the mail.  If you don't hear from [place] in 2 weeks, please call the office at the number below.   Will plan for imaging of your abdomen after April 20  Follow up in 6 weeks with PCP for discussion of mood disorder  Have a good trip  If you have any questions or concerns, please do not hesitate to call the office at [phone].  I look forward to our next visit and until then take care and stay safe.  Regards,   Carollee Leitz, MD   [facility]

## 2022-10-20 NOTE — Progress Notes (Signed)
SUBJECTIVE:   Chief Complaint  Patient presents with   New Patient (Initial Visit)   HPI Presents to clinic to establish care.  No acute concerns today.  Only issue pertaining to abdominal mass that has been noticeable for few months.  Reports when laying down disappears.  Denies any nausea/vomiting, fevers, abdominal pain, change in stool pattern, diarrhea or constipation.  No recent injury or abdominal trauma.  No previous abdominal surgeries.  Recently evaluated for rectal bleeding by GI.  Colonoscopy showed internal hemorrhoids, polyp was removed.  Recommended repeat colonoscopy in 3 years.  Mood disorder Has been seeing behavioral health NP for anxiety/depression.  Reports was also told had OCD.  He is not currently on any medications.  Self discontinued by Lavera Guise down and trazodone.  Did not like side effects of medications.  He takes propranolol as needed when subpoenaed to court.  Has previously tried Wellbutrin and Prozac in the past.  Does not see a therapist regularly.  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  Use condoms when sexually active.    OBJECTIVE:  BP 118/78   Pulse (!) 56   Temp 98.4 F (36.9 C) (Oral)   Ht 5\' 5"  (1.651 m)   Wt 179 lb (81.2 kg)   SpO2 97%   BMI 29.79 kg/m    Physical Exam Vitals reviewed.  HENT:     Head: Normocephalic.     Right Ear: Tympanic membrane, ear canal and external ear normal.     Left Ear: Tympanic membrane, ear canal and external ear normal.     Nose: Nose normal.     Mouth/Throat:     Mouth: Mucous membranes are moist.  Eyes:     Conjunctiva/sclera: Conjunctivae normal.     Pupils: Pupils  are equal, round, and reactive to light.  Neck:     Thyroid: No thyromegaly or thyroid tenderness.     Vascular: No carotid bruit.  Cardiovascular:     Rate and Rhythm: Normal rate and regular rhythm.     Pulses: Normal pulses.     Heart sounds: Normal heart sounds.  Pulmonary:     Effort: Pulmonary effort is normal.     Breath sounds: Normal breath sounds.  Abdominal:     General: Abdomen is flat. Bowel sounds are normal. There is no distension.     Palpations: Abdomen is soft.     Tenderness: There is no abdominal tenderness. There is no guarding or rebound.     Hernia: A hernia is present.  Musculoskeletal:        General: Normal range of motion.     Cervical back: Normal range of motion and neck supple.     Right lower leg: No edema.     Left lower leg: No edema.  Lymphadenopathy:     Cervical: No cervical adenopathy.  Neurological:     Mental Status: He is alert.  Psychiatric:        Mood and Affect: Mood normal.        Behavior: Behavior normal.        Thought Content: Thought content normal.        Judgment: Judgment normal.     ASSESSMENT/PLAN:  Abdominal hernia  without obstruction and without gangrene, recurrence not specified, unspecified hernia type Assessment & Plan: New problem.  Reducible nonpulsatile midline abdominal mass.  More notable with abdominal pressure applied.  Disappears when laying down.  No pain, edema or erythema present. Suspect abdominal hernia CT abdomen to confirm, surgical evaluation pending results.  Orders: -     CT ABDOMEN PELVIS WO CONTRAST; Future  Overweight (BMI 25.0-29.9) -     Comprehensive metabolic panel; Future -     Hemoglobin A1c; Future -     CBC with Differential/Platelet; Future -     Lipid panel; Future -     TSH; Future -     VITAMIN D 25 Hydroxy (Vit-D Deficiency, Fractures); Future -     Vitamin B12; Future  Asthma, cough variant Assessment & Plan: Chronic.  Stable.  Uses albuterol inhaler  infrequently. Previous PFTs in 2016.  FEV1 at that time 85%. Continue albuterol 2 puffs every 6 hours as needed.   Adenomatous polyp of ascending colon Assessment & Plan: Follows with GI Colonoscopy every 3 years.  Due 04/2025.   Rectal bleeding Assessment & Plan: Colonoscopy notable for internal hemorrhoids.  Return to GI for banding, was noted to have anal fissures and hemorrhoids had resolved. Continue to follow with GI. Discussed increasing soluble fiber in diet Increase water intake Important not to strain with BMs.   Mood disorder Assessment & Plan: Chronic.  Stable.  Self discontinued trazodone, lurasidone secondary to side effects. Encouraged CBT Continue follow-up with behavioral health Recommend to discuss with behavioral health genetic testing for medication appropriateness. Follow-up as needed.    HCM Tdap up-to-date COVID up-to-date Colonoscopy up-to-date.  Recent colonoscopy notable for 4-5 tubular adenomatous polyps.  Recommend repeat in 3 years.  PDMP reviewed  Return in about 6 weeks (around 12/01/2022) for PCP.  Dana Allan, MD

## 2022-10-25 ENCOUNTER — Encounter: Payer: Self-pay | Admitting: Family Medicine

## 2022-10-25 DIAGNOSIS — F39 Unspecified mood [affective] disorder: Secondary | ICD-10-CM | POA: Insufficient documentation

## 2022-10-25 DIAGNOSIS — E663 Overweight: Secondary | ICD-10-CM | POA: Insufficient documentation

## 2022-10-25 DIAGNOSIS — K469 Unspecified abdominal hernia without obstruction or gangrene: Secondary | ICD-10-CM | POA: Insufficient documentation

## 2022-10-25 NOTE — Assessment & Plan Note (Signed)
New problem.  Reducible nonpulsatile midline abdominal mass.  More notable with abdominal pressure applied.  Disappears when laying down.  No pain, edema or erythema present. Suspect abdominal hernia CT abdomen to confirm, surgical evaluation pending results.

## 2022-10-25 NOTE — Assessment & Plan Note (Signed)
Follows with GI Colonoscopy every 3 years.  Due 04/2025.

## 2022-10-25 NOTE — Assessment & Plan Note (Signed)
Chronic.  Stable.  Self discontinued trazodone, lurasidone secondary to side effects. Encouraged CBT Continue follow-up with behavioral health Recommend to discuss with behavioral health genetic testing for medication appropriateness. Follow-up as needed.

## 2022-10-25 NOTE — Assessment & Plan Note (Signed)
Chronic.  Stable.  Uses albuterol inhaler infrequently. Previous PFTs in 2016.  FEV1 at that time 85%. Continue albuterol 2 puffs every 6 hours as needed.

## 2022-10-25 NOTE — Assessment & Plan Note (Signed)
Colonoscopy notable for internal hemorrhoids.  Return to GI for banding, was noted to have anal fissures and hemorrhoids had resolved. Continue to follow with GI. Discussed increasing soluble fiber in diet Increase water intake Important not to strain with BMs.

## 2022-11-16 ENCOUNTER — Ambulatory Visit
Admission: RE | Admit: 2022-11-16 | Discharge: 2022-11-16 | Disposition: A | Discharge status: Home / Self Care | Payer: Managed Care, Other (non HMO) | Source: Ambulatory Visit | Attending: Family Medicine | Admitting: Family Medicine

## 2022-11-16 DIAGNOSIS — K469 Unspecified abdominal hernia without obstruction or gangrene: Secondary | ICD-10-CM

## 2022-12-01 DIAGNOSIS — E559 Vitamin D deficiency, unspecified: Secondary | ICD-10-CM | POA: Insufficient documentation

## 2022-12-01 DIAGNOSIS — E538 Deficiency of other specified B group vitamins: Secondary | ICD-10-CM | POA: Insufficient documentation

## 2022-12-02 ENCOUNTER — Encounter: Payer: Self-pay | Admitting: Family Medicine

## 2022-12-02 ENCOUNTER — Ambulatory Visit: Payer: Managed Care, Other (non HMO) | Admitting: Family Medicine

## 2022-12-02 VITALS — BP 118/78 | HR 65 | Ht 65.0 in | Wt 170.0 lb

## 2022-12-02 DIAGNOSIS — K760 Fatty (change of) liver, not elsewhere classified: Secondary | ICD-10-CM

## 2022-12-02 DIAGNOSIS — F39 Unspecified mood [affective] disorder: Secondary | ICD-10-CM | POA: Diagnosis not present

## 2022-12-02 DIAGNOSIS — E781 Pure hyperglyceridemia: Secondary | ICD-10-CM | POA: Diagnosis not present

## 2022-12-02 DIAGNOSIS — K429 Umbilical hernia without obstruction or gangrene: Secondary | ICD-10-CM

## 2022-12-02 DIAGNOSIS — K469 Unspecified abdominal hernia without obstruction or gangrene: Secondary | ICD-10-CM

## 2022-12-02 DIAGNOSIS — E538 Deficiency of other specified B group vitamins: Secondary | ICD-10-CM

## 2022-12-02 DIAGNOSIS — E559 Vitamin D deficiency, unspecified: Secondary | ICD-10-CM

## 2022-12-02 DIAGNOSIS — J45991 Cough variant asthma: Secondary | ICD-10-CM

## 2022-12-02 LAB — LIPID PANEL
Cholesterol: 143 mg/dL (ref 0–200)
HDL: 32.6 mg/dL — ABNORMAL LOW (ref >39.00)
LDL Cholesterol: 98 mg/dL (ref 0–99)
NonHDL: 110.25
Total CHOL/HDL Ratio: 4
Triglycerides: 63 mg/dL (ref 0.0–149.0)
VLDL: 12.6 mg/dL (ref 0.0–40.0)

## 2022-12-02 NOTE — Patient Instructions (Addendum)
It was a pleasure meeting you today. Thank you for allowing me to take part in your health care.  Our goals for today as we discussed include:  We will get some labs today.  If they are abnormal or we need to do something about them, I will call you.  If they are normal, I will send you a message on MyChart (if it is active) or a letter in the mail.  If you don't hear from [place] in 2 weeks, please call the office at the number below.   Continue vitamin B 12 supplements daily Continue vitamin D supplements weekly    If you have any questions or concerns, please do not hesitate to call the office at [phone].  I look forward to our next visit and until then take care and stay safe.  Regards,   Dana Allan, MD   [facility]   Nonalcoholic Fatty Liver Disease Diet Introduction Nonalcoholic fatty liver disease is a condition that causes fat to accumulate in and around the liver. The disease makes it harder for the liver to work the way that it should. Following a healthy diet can help to keep nonalcoholic fatty liver disease under control. It can also help to prevent or improve conditions that are associated with the disease, such as heart disease, diabetes, high blood pressure, and abnormal cholesterol levels. Along with regular exercise, this diet: Promotes weight loss. Helps to control blood sugar levels. Helps to improve the way that the body uses insulin. What do I need to know about this diet? Use the glycemic index (GI) to plan your meals. The index tells you how quickly a food will raise your blood sugar. Choose low-GI foods. These foods take a longer time to raise blood sugar. Keep track of how many calories you take in. Eating the right amount of calories will help you to achieve a healthy weight. You may want to follow a Mediterranean diet. This diet includes a lot of vegetables, lean meats or fish, whole grains, fruits, and healthy oils and fats. What foods can  I eat? Grains  Whole grains, such as whole-wheat or whole-grain breads, crackers, tortillas, cereals, and pasta. Stone-ground whole wheat. Pumpernickel bread. Unsweetened oatmeal. Bulgur. Barley. Quinoa. Brown or wild rice. Corn or whole-wheat flour tortillas. Vegetables  Lettuce. Spinach. Peas. Beets. Cauliflower. Cabbage. Broccoli. Carrots. Tomatoes. Squash. Eggplant. Herbs. Peppers. Onions. Cucumbers. Brussels sprouts. Yams and sweet potatoes. Beans. Lentils. Fruits  Bananas. Apples. Oranges. Grapes. Papaya. Mango. Pomegranate. Kiwi. Grapefruit. Cherries. Meats and Other Protein Sources  Seafood and shellfish. Lean meats. Poultry. Tofu. Dairy  Low-fat or fat-free dairy products, such as yogurt, cottage cheese, and cheese. Beverages  Water. Sugar-free drinks. Tea. Coffee. Low-fat or skim milk. Milk alternatives, such as soy or almond milk. Real fruit juice. Condiments  Mustard. Relish. Low-fat, low-sugar ketchup and barbecue sauce. Low-fat or fat-free mayonnaise. Sweets and Desserts  Sugar-free sweets. Fats and Oils  Avocado. Canola or olive oil. Nuts and nut butters. Seeds. The items listed above may not be a complete list of recommended foods or beverages. Contact your dietitian for more options.  What foods are not recommended? Palm oil and coconut oil. Processed foods. Fried foods. Sweetened drinks, such as sweet tea, milkshakes, snow cones, iced sweet drinks, and sodas. Alcohol. Sweets. Foods that contain a lot of salt or sodium. The items listed above may not be a complete list of foods and beverages to avoid. Contact your dietitian for more information.  This  information is not intended to replace advice given to you by your health care provider. Make sure you discuss any questions you have with your health care provider. Document Released: 11/20/2014 Document Revised: 12/12/2015 Document Reviewed: 07/31/2014  2017 Elsevier   Fatty Liver Introduction  Fatty liver, also called  hepatic steatosis or steatohepatitis, is a condition in which too much fat has built up in your liver cells. The liver removes harmful substances from your bloodstream. It produces fluids your body needs. It also helps your body use and store energy from the food you eat. In many cases, fatty liver does not cause symptoms or problems. It is often diagnosed when tests are being done for other reasons. However, over time, fatty liver can cause inflammation that may lead to more serious liver problems, such as scarring of the liver (cirrhosis). What are the causes? Causes of fatty liver may include: Drinking too much alcohol. Poor nutrition. Obesity. Cushing syndrome. Diabetes. Hyperlipidemia. Pregnancy. Certain drugs. Poisons. Some viral infections. What increases the risk? You may be more likely to develop fatty liver if you: Abuse alcohol. Are pregnant. Are overweight. Have diabetes. Have hepatitis. Have a high triglyceride level. What are the signs or symptoms? Fatty liver often does not cause any symptoms. In cases where symptoms develop, they can include: Fatigue. Weakness. Weight loss. Confusion. Abdominal pain. Yellowing of your skin and the white parts of your eyes (jaundice). Nausea and vomiting. How is this diagnosed? Fatty liver may be diagnosed by: Physical exam and medical history. Blood tests. Imaging tests, such as an ultrasound, CT scan, or MRI. Liver biopsy. A small sample of liver tissue is removed using a needle. The sample is then looked at under a microscope. How is this treated? Fatty liver is often caused by other health conditions. Treatment for fatty liver may involve medicines and lifestyle changes to manage conditions such as: Alcoholism. High cholesterol. Diabetes. Being overweight or obese. Follow these instructions at home: Eat a healthy diet as directed by your health care provider. Exercise regularly. This can help you lose weight and control  your cholesterol and diabetes. Talk to your health care provider about an exercise plan and which activities are best for you. Do not drink alcohol. Take medicines only as directed by your health care provider. Contact a health care provider if: You have difficulty controlling your: Blood sugar. Cholesterol. Alcohol consumption. Get help right away if: You have abdominal pain. You have jaundice. You have nausea and vomiting. This information is not intended to replace advice given to you by your health care provider. Make sure you discuss any questions you have with your health care provider.

## 2022-12-02 NOTE — Progress Notes (Signed)
SUBJECTIVE:   Chief Complaint  Patient presents with   Medical Management of Chronic Issues   HPI Presents to clinic for follow-up chronic disease management.  No acute concerns today.  Recently returned from vacation on a cruise.    Reviewed with send CT abdomen with patient. Notable for small complex fat-containing umbilical hernia with both umbilical and periumbilical components.  Umbilical hernia neck measuring 6 mm, left periumbilical component measuring 7 mm both containing fat only.  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  Mood disorder Doing well.  Continues to take propranolol 10 mg when needing to appear in court for subpoenaed investigations.  Not interested in long-term medication use as previously had tried medications in did not like side effects.   PERTINENT PMH / PSH: Mood disorder, anxiety/depression Childhood asthma  OBJECTIVE:  BP 118/78   Pulse 65   Ht 5\' 5"  (1.651 m)   Wt 170 lb (77.1 kg)   SpO2 98%   BMI 28.29 kg/m    Physical Exam Vitals reviewed.  Constitutional:      General: He is not in acute distress.    Appearance: Normal appearance. He is not ill-appearing, toxic-appearing or diaphoretic.  Eyes:     General:        Right eye: No discharge.        Left eye: No discharge.  Cardiovascular:     Rate and Rhythm: Normal rate.  Pulmonary:     Effort: Pulmonary effort is normal.  Abdominal:     Hernia: A hernia is present.  Skin:    General: Skin is warm and dry.  Neurological:     Mental Status: He is alert and oriented to person, place, and time. Mental status is at baseline.  Psychiatric:        Mood and Affect: Mood normal.        Behavior: Behavior normal.        Thought Content: Thought content normal.        Judgment: Judgment normal.       12/02/2022    8:38 AM 10/20/2022    8:45 AM 07/10/2022    9:48 AM 06/04/2022    7:46 AM 04/15/2022   10:27 AM  Depression  screen PHQ 2/9  Decreased Interest 0 0 0 0 0  Down, Depressed, Hopeless 0 0  0 0  PHQ - 2 Score 0 0 0 0 0  Altered sleeping 1   3   Tired, decreased energy 1   3   Change in appetite 0   0   Feeling bad or failure about yourself  0   0   Trouble concentrating 0   0   Moving slowly or fidgety/restless 0   0   Suicidal thoughts 0   0   PHQ-9 Score 2   6   Difficult doing work/chores Not difficult at all            12/02/2022    8:38 AM 10/20/2022    8:45 AM 08/25/2021    1:59 PM 01/07/2021    8:14 AM  GAD 7 : Generalized Anxiety Score  Nervous, Anxious, on Edge 0 0 1 0  Control/stop worrying 0 0 0 0  Worry too much - different things 0 0 0 0  Trouble relaxing 0 0 0 0  Restless 0 0 0 0  Easily annoyed or irritable 0 0 1 0  Afraid - awful  might happen 0 0 0 0  Total GAD 7 Score 0 0 2 0  Anxiety Difficulty Not difficult at all Not difficult at all Not difficult at all Not difficult at all     ASSESSMENT/PLAN:  Mood disorder [facility]) Assessment & Plan: Chronic.  Stable.  Denies SI/HI Encouraged continued CBT Not interested in psychiatry evaluation at this time. Continue propranolol 10 mg 3 times daily as needed for performance anxiety    Hypertriglyceridemia -     Lipid panel  Umbilical hernia without obstruction and without gangrene Assessment & Plan: Small umbilical hernia 6 mm and a left periumbilical hernia measuring 7 mm noted on CT abdomen both containing only fat.  No indication for surgical intervention however offered surgical evaluation.  Patient declined at this time given asymptomatic. Will continue to monitor.   Hepatic steatosis Assessment & Plan: Hepatic steatosis noted on CT abdomen 05/01. Encouraged healthy diet and increase activity   Vitamin D deficiency Assessment & Plan: Vitamin D low Continue vitamin D 1.25 mg weekly    Vitamin B12 deficiency Assessment & Plan: Vitamin B12 low Continue vitamin B12 1000 mcg daily   HCM Tdap  up-to-date COVID up-to-date Colonoscopy up-to-date.  Recent colonoscopy notable for 4-5 tubular adenomatous polyps.  Recommend repeat in 3 years.  PDMP reviewed  Return if symptoms worsen or fail to improve.  Dana Allan, MD

## 2022-12-18 ENCOUNTER — Encounter: Payer: Self-pay | Admitting: Family Medicine

## 2022-12-18 DIAGNOSIS — K760 Fatty (change of) liver, not elsewhere classified: Secondary | ICD-10-CM | POA: Insufficient documentation

## 2022-12-18 DIAGNOSIS — E781 Pure hyperglyceridemia: Secondary | ICD-10-CM | POA: Insufficient documentation

## 2022-12-18 HISTORY — DX: Pure hyperglyceridemia: E78.1

## 2022-12-18 NOTE — Assessment & Plan Note (Signed)
Small umbilical hernia 6 mm and a left periumbilical hernia measuring 7 mm noted on CT abdomen both containing only fat.  No indication for surgical intervention however offered surgical evaluation.  Patient declined at this time given asymptomatic. Will continue to monitor.

## 2022-12-18 NOTE — Assessment & Plan Note (Signed)
Vitamin B12 low Continue vitamin B12 1000 mcg daily

## 2022-12-18 NOTE — Assessment & Plan Note (Signed)
Hepatic steatosis noted on CT abdomen 05/01. Encouraged healthy diet and increase activity

## 2022-12-18 NOTE — Assessment & Plan Note (Signed)
Vitamin D low Continue vitamin D 1.25 mg weekly

## 2022-12-18 NOTE — Assessment & Plan Note (Signed)
Chronic.  Stable.  Denies SI/HI Encouraged continued CBT Not interested in psychiatry evaluation at this time. Continue propranolol 10 mg 3 times daily as needed for performance anxiety

## 2023-03-31 ENCOUNTER — Encounter: Payer: Self-pay | Admitting: Behavioral Health

## 2023-03-31 ENCOUNTER — Ambulatory Visit: Payer: 59 | Admitting: Behavioral Health

## 2023-03-31 DIAGNOSIS — F411 Generalized anxiety disorder: Secondary | ICD-10-CM

## 2023-03-31 DIAGNOSIS — F331 Major depressive disorder, recurrent, moderate: Secondary | ICD-10-CM

## 2023-03-31 MED ORDER — VILAZODONE HCL 20 MG PO TABS
ORAL_TABLET | ORAL | 1 refills | Status: DC
Start: 2023-03-31 — End: 2023-06-02

## 2023-03-31 NOTE — Progress Notes (Signed)
[facility]  Patient ID: Anthony Hale,  MRN: [id number]  PCP: Dana Allan, MD  Date of Evaluation: 03/31/2023 Time spent:30 minutes  Chief Complaint:  Chief Complaint   Depression; Anxiety; Follow-up; Patient Education; Medication Refill; Work Conflict     HISTORY/CURRENT STATUS: HPI  37 year old male presents to this office for follow up and medication management. Says he has been doing well overall. His last appt was in January. He has not been taking any medication for anxiety or depression. Says that he has been overwhelmed lately at work for prolonged period and feels like he need to be back on his Viibryd. He is requesting refill today. Anxiety today is 3/10 and depression 4/10. 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 He is getting 6-7 hours per night.  He denies mania, no psychosis, no SI/HI.   Past psychiatric medication trials: Wellbutrin Trazodone Prozac      Individual Medical History/ Review of Systems: Changes? :No   Allergies: Patient has no known allergies.  Current Medications:  Current Outpatient Medications:    Vilazodone HCl 20 MG TABS, Take one tablet by mouth daily, Disp: 30 tablet, Rfl: 1   albuterol (VENTOLIN HFA) 108 (90 Base) MCG/ACT inhaler, Inhale 2 puffs into the lungs every 6 (six) hours as needed for wheezing or shortness of breath., Disp: 18 g, Rfl: 0   Cyanocobalamin (VITAMIN B-12) 1000 MCG SUBL, Place 2 tablets (2,000 mcg total) under the tongue daily., Disp: 90 tablet, Rfl: 3   propranolol (INDERAL) 10 MG tablet, Take 1 tablet (10 mg total) by mouth 3 (three) times daily., Disp: 90 tablet, Rfl: 3   Vitamin D, Ergocalciferol, (DRISDOL) 1.25 MG (50000 UNIT) CAPS capsule, Take 1 capsule (50,000 Units total) by mouth every 7 (seven) days., Disp: 12 capsule, Rfl: 3 Medication Side Effects: none  Family Medical/ Social History: Changes? No  MENTAL HEALTH EXAM:  There were no vitals taken  for this visit.There is no height or weight on file to calculate BMI.  General Appearance: Casual, Neat, and Well Groomed  Eye Contact:  Good  Speech:  Clear and Coherent  Volume:  Normal  Mood:  Anxious and Depressed  Affect:  Appropriate, Depressed, and Anxious  Thought Process:  Coherent  Orientation:  Full (Time, Place, and Person)  Thought Content: Logical   Suicidal Thoughts:  No  Homicidal Thoughts:  No  Memory:  WNL  Judgement:  Good  Insight:  Good  Psychomotor Activity:  Normal  Concentration:  Concentration: Good  Recall:  Good  Fund of Knowledge: Good  Language: Good  Assets:  Desire for Improvement  ADL's:  Intact  Cognition: WNL  Prognosis:  Good    DIAGNOSES:    ICD-10-CM   1. Generalized anxiety disorder  F41.1 Vilazodone HCl 20 MG TABS    2. Major depressive disorder, recurrent episode, moderate (HCC)  F33.1 Vilazodone HCl 20 MG TABS      Receiving Psychotherapy: No    RECOMMENDATIONS:  Restart  Viibryd to 20 mg daily.  To continue Trazodone 50 mg for sleep. 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  To report any side effects or worsening symptoms promptly Will follow up in 6 weeks to reassess Reinforced taking medications exactly as prescribed and reviewed good sleep hygiene.  Provided emergency contact information Greater than 50% of 30 min face to face time  with patient was spent on counseling and coordination of care. He says his work situation has been slowly improving.  Reviewed PDMP          Joan Flores, NP

## 2023-05-14 ENCOUNTER — Ambulatory Visit: Payer: 59 | Admitting: Behavioral Health

## 2023-05-19 ENCOUNTER — Ambulatory Visit (INDEPENDENT_AMBULATORY_CARE_PROVIDER_SITE_OTHER): Payer: Self-pay | Admitting: Behavioral Health

## 2023-05-19 DIAGNOSIS — Z0389 Encounter for observation for other suspected diseases and conditions ruled out: Secondary | ICD-10-CM

## 2023-05-19 NOTE — Progress Notes (Signed)
Pt did not show for scheduled appt and did not provide 24 hour notice as required. Additional fees to be assessed.  

## 2023-05-31 ENCOUNTER — Ambulatory Visit: Payer: 59 | Admitting: Behavioral Health

## 2023-06-02 ENCOUNTER — Encounter: Payer: Self-pay | Admitting: Behavioral Health

## 2023-06-02 ENCOUNTER — Ambulatory Visit: Payer: 59 | Admitting: Behavioral Health

## 2023-06-02 DIAGNOSIS — F411 Generalized anxiety disorder: Secondary | ICD-10-CM | POA: Diagnosis not present

## 2023-06-02 DIAGNOSIS — F331 Major depressive disorder, recurrent, moderate: Secondary | ICD-10-CM | POA: Diagnosis not present

## 2023-06-02 DIAGNOSIS — F422 Mixed obsessional thoughts and acts: Secondary | ICD-10-CM | POA: Diagnosis not present

## 2023-06-02 DIAGNOSIS — G4700 Insomnia, unspecified: Secondary | ICD-10-CM

## 2023-06-02 MED ORDER — VILAZODONE HCL 20 MG PO TABS
ORAL_TABLET | ORAL | 1 refills | Status: AC
Start: 2023-06-02 — End: ?

## 2023-06-02 MED ORDER — PROPRANOLOL HCL 10 MG PO TABS: 10.0000 mg | ORAL_TABLET | Freq: Three times a day (TID) | ORAL | 3 refills | Status: DC

## 2023-06-02 NOTE — Progress Notes (Signed)
[facility]  Patient ID: Anthony Hale,  MRN: [id number]  PCP: Dana Allan, MD  Date of Evaluation: 06/02/2023 Time spent:20 minutes  Chief Complaint:   HISTORY/CURRENT STATUS: HPI 37 year old male presents to this office for follow up and medication management. Says he has been doing well overall. He and his girlfriend of one year broke up but says he is recovering.  Medication restart has been helping. Says that he has still been overwhelmed lately at work for prolonged period and feels like he need to be back on his Viibryd. He is requesting refill today. Anxiety today is 2/10 and depression 4/10. 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 He is getting 6-7 hours per night.  He denies mania, no psychosis, no SI/HI.   Past psychiatric medication trials: Wellbutrin Trazodone Prozac         Individual Medical History/ Review of Systems: Changes? :No   Allergies: Patient has no known allergies.  Current Medications:  Current Outpatient Medications:    albuterol (VENTOLIN HFA) 108 (90 Base) MCG/ACT inhaler, Inhale 2 puffs into the lungs every 6 (six) hours as needed for wheezing or shortness of breath., Disp: 18 g, Rfl: 0   Cyanocobalamin (VITAMIN B-12) 1000 MCG SUBL, Place 2 tablets (2,000 mcg total) under the tongue daily., Disp: 90 tablet, Rfl: 3   propranolol (INDERAL) 10 MG tablet, Take 1 tablet (10 mg total) by mouth 3 (three) times daily., Disp: 90 tablet, Rfl: 3   Vilazodone HCl 20 MG TABS, Take one tablet by mouth daily, Disp: 30 tablet, Rfl: 1   Vitamin D, Ergocalciferol, (DRISDOL) 1.25 MG (50000 UNIT) CAPS capsule, Take 1 capsule (50,000 Units total) by mouth every 7 (seven) days., Disp: 12 capsule, Rfl: 3 Medication Side Effects: none  Family Medical/ Social History: Changes? No  MENTAL HEALTH EXAM:  There were no vitals taken for this visit.There is no height or weight on file to calculate BMI.  General  Appearance: Casual, Neat, and Well Groomed  Eye Contact:  Good  Speech:  Clear and Coherent  Volume:  Normal  Mood:  Depressed  Affect:  Appropriate  Thought Process:  Coherent  Orientation:  Full (Time, Place, and Person)  Thought Content: Logical   Suicidal Thoughts:  No  Homicidal Thoughts:  No  Memory:  WNL  Judgement:  Good  Insight:  Good  Psychomotor Activity:  Normal  Concentration:  Concentration: Good  Recall:  Good  Fund of Knowledge: Good  Language: Good  Assets:  Desire for Improvement  ADL's:  Intact  Cognition: WNL  Prognosis:  Good    DIAGNOSES: No diagnosis found.  Receiving Psychotherapy: No    RECOMMENDATIONS:   RECOMMENDATIONS:   Continue Viibryd to 20 mg daily.  To continue Trazodone 50 mg for sleep. 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  To report any side effects or worsening symptoms promptly Will follow up in 12weeks to reassess Reinforced taking medications exactly as prescribed and reviewed good sleep hygiene.  Provided emergency contact information Greater than 50% of 30 min face to face time with patient was spent on counseling and coordination of care. He says his work situation has been slowly improving.  Reviewed PDMP      Joan Flores, NP

## 2023-06-08 ENCOUNTER — Encounter: Payer: Managed Care, Other (non HMO) | Admitting: Family Medicine

## 2023-06-14 ENCOUNTER — Encounter: Payer: Managed Care, Other (non HMO) | Admitting: Family Medicine

## 2023-06-23 ENCOUNTER — Ambulatory Visit (INDEPENDENT_AMBULATORY_CARE_PROVIDER_SITE_OTHER): Payer: Managed Care, Other (non HMO) | Admitting: Family Medicine

## 2023-06-23 ENCOUNTER — Encounter: Payer: Self-pay | Admitting: Family Medicine

## 2023-06-23 VITALS — BP 112/68 | HR 66 | Temp 98.0°F | Resp 18 | Ht 65.75 in | Wt 174.4 lb

## 2023-06-23 DIAGNOSIS — H6122 Impacted cerumen, left ear: Secondary | ICD-10-CM

## 2023-06-23 DIAGNOSIS — Z114 Encounter for screening for human immunodeficiency virus [HIV]: Secondary | ICD-10-CM

## 2023-06-23 DIAGNOSIS — E559 Vitamin D deficiency, unspecified: Secondary | ICD-10-CM

## 2023-06-23 DIAGNOSIS — E538 Deficiency of other specified B group vitamins: Secondary | ICD-10-CM

## 2023-06-23 DIAGNOSIS — K219 Gastro-esophageal reflux disease without esophagitis: Secondary | ICD-10-CM

## 2023-06-23 DIAGNOSIS — Z0000 Encounter for general adult medical examination without abnormal findings: Secondary | ICD-10-CM | POA: Diagnosis not present

## 2023-06-23 LAB — VITAMIN B12: Vitamin B-12: 621 pg/mL (ref 211–911)

## 2023-06-23 LAB — VITAMIN D 25 HYDROXY (VIT D DEFICIENCY, FRACTURES): VITD: 32.87 ng/mL (ref 30.00–100.00)

## 2023-06-23 NOTE — Patient Instructions (Signed)
It was a pleasure meeting you today. Thank you for allowing me to take part in your health care.  Our goals for today as we discussed include:  We will get some labs today.  If they are abnormal or we need to do something about them, I will call you.  If they are normal, I will send you a message on MyChart (if it is active) or a letter in the mail.  If you don't hear from [place] in 2 weeks, please call the office at the number below.   Will complete forms and notify when to come pick up.    This is a list of the screening recommended for you and due dates:  Health Maintenance  Topic Date Due   Colon Cancer Screening  04/21/2025   DTaP/Tdap/Td vaccine (3 - Td or Tdap) 01/21/2026   Flu Shot  Completed   Hepatitis C Screening  Completed   HIV Screening  Completed   HPV Vaccine  Aged Out   COVID-19 Vaccine  Discontinued    Follow up as needed  If you have any questions or concerns, please do not hesitate to call the office at [phone].  I look forward to our next visit and until then take care and stay safe.  Regards,   Dana Allan, MD   [facility]

## 2023-06-23 NOTE — Progress Notes (Signed)
SUBJECTIVE:   Chief Complaint  Patient presents with   Annual Exam   HPI Presents for annual physical and needing forms completed before 04/2024  Discussed the use of AI scribe software for clinical note transcription with the patient, who gave verbal consent to proceed.  History of Present Illness The patient, a 37 year old individual with a history of anxiety, presented for an annual physical. They reported a recent episode of chest tightness lasting approximately five minutes while at rest. The sensation was described as non-constricting and resolved spontaneously. The patient denied any associated symptoms such as shortness of breath or sharp pain. They also reported no similar previous episodes. The patient's current medication regimen includes propranolol as needed and daily vilazodone, both managed by a therapist.  The patient also reported a change in employment status, now working part-time in addition to their full-time job. They denied any significant stressors related to this change.  The patient requested an HIV test due to occupational exposure risks. They also reported self-managing their vitamin D and B supplementation due to insurance coverage issues. The patient expressed a desire to have these levels checked.  Lastly, the patient reported a need for a fasting glucose test for insurance purposes, which they planned to complete on a separate visit.    PERTINENT PMH / PSH: As above  OBJECTIVE:  BP 112/68   Pulse 66   Temp 98 F (36.7 C)   Resp 18   Ht 5' 5.75" (1.67 m)   Wt 174 lb 6 oz (79.1 kg)   SpO2 98%   BMI 28.36 kg/m    Physical Exam Vitals reviewed.  HENT:     Head: Normocephalic.     Right Ear: Hearing, tympanic membrane, ear canal and external ear normal.     Left Ear: Hearing, ear canal and external ear normal. There is impacted cerumen.     Nose: Nose normal.     Mouth/Throat:     Mouth: Mucous membranes are moist.  Eyes:      Conjunctiva/sclera: Conjunctivae normal.     Pupils: Pupils are equal, round, and reactive to light.  Neck:     Vascular: No carotid bruit.  Cardiovascular:     Rate and Rhythm: Normal rate and regular rhythm.     Pulses: Normal pulses.     Heart sounds: Normal heart sounds.  Pulmonary:     Effort: Pulmonary effort is normal.     Breath sounds: Normal breath sounds.  Abdominal:     General: Abdomen is flat. Bowel sounds are normal.     Palpations: Abdomen is soft.  Musculoskeletal:        General: Normal range of motion.     Cervical back: Normal range of motion and neck supple.     Right lower leg: No edema.     Left lower leg: No edema.  Lymphadenopathy:     Cervical: No cervical adenopathy.  Neurological:     General: No focal deficit present.     Mental Status: He is alert and oriented to person, place, and time. Mental status is at baseline.  Psychiatric:        Mood and Affect: Mood normal.        Behavior: Behavior normal.        Thought Content: Thought content normal.        Judgment: Judgment normal.        06/23/2023   11:53 AM 12/02/2022    8:38 AM 10/20/2022  8:45 AM 07/10/2022    9:48 AM 06/04/2022    7:46 AM  Depression screen PHQ 2/9  Decreased Interest 0 0 0 0 0  Down, Depressed, Hopeless 0 0 0  0  PHQ - 2 Score 0 0 0 0 0  Altered sleeping 1 1   3   Tired, decreased energy 1 1   3   Change in appetite 0 0   0  Feeling bad or failure about yourself  0 0   0  Trouble concentrating 0 0   0  Moving slowly or fidgety/restless 0 0   0  Suicidal thoughts 0 0   0  PHQ-9 Score 2 2   6   Difficult doing work/chores Not difficult at all Not difficult at all         06/23/2023   11:54 AM 12/02/2022    8:38 AM 10/20/2022    8:45 AM 08/25/2021    1:59 PM  GAD 7 : Generalized Anxiety Score  Nervous, Anxious, on Edge 0 0 0 1  Control/stop worrying 0 0 0 0  Worry too much - different things 0 0 0 0  Trouble relaxing 0 0 0 0  Restless 0 0 0 0  Easily annoyed or  irritable 0 0 0 1  Afraid - awful might happen 0 0 0 0  Total GAD 7 Score 0 0 0 2  Anxiety Difficulty Not difficult at all Not difficult at all Not difficult at all Not difficult at all    ASSESSMENT/PLAN:  Annual physical exam Assessment & Plan: Healthy male Reviewed recent labs Occupational Exposure Request for routine HIV testing due to occupational exposure to bodily fluids. Will completed forms for physical.  Will notify patient when ready for pick up as patient will need to complete as well.   Vitamin B12 deficiency -     VITAMIN D 25 Hydroxy (Vit-D Deficiency, Fractures)  Vitamin D deficiency Assessment & Plan: -Check Vitamin D levels   Orders: -     Vitamin B12  Encounter for screening for HIV -     HIV Antibody (routine testing w rflx)  Impacted cerumen of left ear Assessment & Plan: Noted in left ear, no associated hearing loss. -Recommend over-the-counter Debrox for 5 days, twice daily. -If no improvement can schedule CMA visit for irrigation   Gastroesophageal reflux disease without esophagitis Assessment & Plan: Brief episode of chest tightness while at rest, resolved spontaneously. No associated symptoms. Likely secondary to heartburn after eating [place] stew. No significant risk factors for cardiac disease. -Return for evaluation if symptoms recur.    PDMP reviewed  Return if symptoms worsen or fail to improve, for PCP.  Dana Allan, MD

## 2023-06-24 ENCOUNTER — Encounter: Payer: Self-pay | Admitting: Family Medicine

## 2023-06-24 LAB — HIV ANTIBODY (ROUTINE TESTING W REFLEX): HIV 1&2 Ab, 4th Generation: NONREACTIVE

## 2023-06-27 ENCOUNTER — Encounter: Payer: Self-pay | Admitting: Family Medicine

## 2023-06-27 DIAGNOSIS — Z114 Encounter for screening for human immunodeficiency virus [HIV]: Secondary | ICD-10-CM | POA: Insufficient documentation

## 2023-06-27 DIAGNOSIS — Z0000 Encounter for general adult medical examination without abnormal findings: Secondary | ICD-10-CM | POA: Insufficient documentation

## 2023-06-27 DIAGNOSIS — H6120 Impacted cerumen, unspecified ear: Secondary | ICD-10-CM | POA: Insufficient documentation

## 2023-06-27 NOTE — Assessment & Plan Note (Signed)
Healthy male Reviewed recent labs Occupational Exposure Request for routine HIV testing due to occupational exposure to bodily fluids. Will completed forms for physical.  Will notify patient when ready for pick up as patient will need to complete as well.

## 2023-06-27 NOTE — Assessment & Plan Note (Signed)
-  Check Vitamin D levels.

## 2023-06-27 NOTE — Assessment & Plan Note (Addendum)
Noted in left ear, no associated hearing loss. -Recommend over-the-counter Debrox for 5 days, twice daily. -If no improvement can schedule CMA visit for irrigation

## 2023-06-27 NOTE — Assessment & Plan Note (Signed)
Brief episode of chest tightness while at rest, resolved spontaneously. No associated symptoms. Likely secondary to heartburn after eating [place] stew. No significant risk factors for cardiac disease. -Return for evaluation if symptoms recur.

## 2023-07-07 ENCOUNTER — Telehealth: Payer: Self-pay | Admitting: Family Medicine

## 2023-07-07 NOTE — Telephone Encounter (Signed)
Copied from CRM [id number]. Topic: General - Other >> Jul 07, 2023  3:59 PM Sonny Dandy B wrote: Reason for CRM: pt called to request documentation pick up. States he left documents on 06/23/23 for the provider to complete. Would like to know if they have been completed..Please call [phone] to confirm pickup of documents for pt. Pt states it was insurance forms

## 2023-07-08 ENCOUNTER — Telehealth: Payer: Self-pay

## 2023-07-08 NOTE — Telephone Encounter (Signed)
Copied from CRM [id number]. Topic: General - Other >> Jul 07, 2023  3:59 PM Anthony Hale wrote: Reason for CRM: pt called to request documentation pick up. States he left documents on 06/23/23 for the provider to complete. Would like to know if they have been completed..Please call [phone] to confirm pickup of documents for pt. Pt states it was insurance forms

## 2023-07-08 NOTE — Telephone Encounter (Signed)
Copied from CRM [id number]. Topic: General - Other >> Jul 07, 2023  3:59 PM Sonny Dandy B wrote: Reason for CRM: pt called to request documentation pick up. States he left documents on 06/23/23 for the provider to complete. Would like to know if they have been completed..Please call [phone] to confirm pickup of documents for pt. Pt states it was insurance forms

## 2023-07-09 NOTE — Telephone Encounter (Signed)
Called pt and he stated that he would come Monday and sign the paperwork.

## 2023-07-09 NOTE — Telephone Encounter (Signed)
Has this been completed?

## 2023-07-22 ENCOUNTER — Ambulatory Visit: Payer: Managed Care, Other (non HMO) | Admitting: Nurse Practitioner

## 2023-07-22 ENCOUNTER — Encounter: Payer: Self-pay | Admitting: Nurse Practitioner

## 2023-07-22 VITALS — BP 124/76 | HR 69 | Temp 98.2°F | Ht 65.75 in | Wt 177.0 lb

## 2023-07-22 DIAGNOSIS — J020 Streptococcal pharyngitis: Secondary | ICD-10-CM | POA: Diagnosis not present

## 2023-07-22 DIAGNOSIS — H9202 Otalgia, left ear: Secondary | ICD-10-CM | POA: Insufficient documentation

## 2023-07-22 DIAGNOSIS — J029 Acute pharyngitis, unspecified: Secondary | ICD-10-CM

## 2023-07-22 LAB — POCT RAPID STREP A (OFFICE): Rapid Strep A Screen: POSITIVE — AB

## 2023-07-22 MED ORDER — AMOXICILLIN 875 MG PO TABS: 875.0000 mg | ORAL_TABLET | Freq: Two times a day (BID) | ORAL | 0 refills | Status: AC

## 2023-07-22 MED ORDER — PREDNISONE 20 MG PO TABS
20.0000 mg | ORAL_TABLET | Freq: Every day | ORAL | 0 refills | Status: AC
Start: 2023-07-22 — End: 2023-07-27

## 2023-07-22 NOTE — Assessment & Plan Note (Signed)
 White exudate to the left tonsils. Tested positive for strep. Will treat with amoxicillin and prednisone. Advised to perform salt water gargles and do not share drinks.  Will let us know if symptoms not improving.

## 2023-07-22 NOTE — Patient Instructions (Signed)
 You tested positive for strep throat.  Strep Throat, Adult Strep throat is an infection in the throat that is caused by bacteria. It is common during the cold months of the year. It mostly affects children who are 43-40 years old. However, people of all ages can get it at any time of the year. This infection spreads from person to person (is contagious) through coughing, sneezing, or having close contact. Your health care provider may use other names to describe the infection. When strep throat affects the tonsils, it is called tonsillitis. When it affects the back of the throat, it is called pharyngitis. What are the causes? This condition is caused by the Streptococcus pyogenes bacteria. What increases the risk? You are more likely to develop this condition if: You care for school-age children, or are around school-age children. Children are more likely to get strep throat and may spread it to others. You spend time in crowded places where the infection can spread easily. You have close contact with someone who has strep throat. What are the signs or symptoms? Symptoms of this condition include: Fever or chills. Redness, swelling, or pain in the tonsils or throat. Pain or difficulty when swallowing. White or yellow spots on the tonsils or throat. Tender glands in the neck and under the jaw. Bad smelling breath. Red rash all over the body. This is rare. How is this diagnosed? This condition is diagnosed by tests that check for the presence and the amount of bacteria that cause strep throat. They are: Rapid strep test. Your throat is swabbed and checked for the presence of bacteria. Results are usually ready in minutes. Throat culture test. Your throat is swabbed. The sample is placed in a cup that allows infections to grow. Results are usually ready in 1 or 2 days. How is this treated? This condition may be treated with: Medicines that kill germs (antibiotics). Medicines that relieve pain  or fever. These include: Ibuprofen or acetaminophen. Aspirin, only for people who are over the age of 35. Throat lozenges. Throat sprays. Follow these instructions at home: Medicines  Take over-the-counter and prescription medicines only as told by your health care provider. Take your antibiotic medicine as told by your health care provider. Do not stop taking the antibiotic even if you start to feel better. Eating and drinking  If you have trouble swallowing, try eating soft foods until your sore throat feels better. Drink enough fluid to keep your urine pale yellow. To help relieve pain, you may have: Warm fluids, such as soup and tea. Cold fluids, such as frozen desserts or popsicles. General instructions Gargle with a salt-water mixture 3-4 times a day or as needed. To make a salt-water mixture, completely dissolve -1 tsp (3-6 g) of salt in 1 cup (237 mL) of warm water. Get plenty of rest. Stay home from work or school until you have been taking antibiotics for 24 hours. Do not use any products that contain nicotine or tobacco. These products include cigarettes, chewing tobacco, and vaping devices, such as e-cigarettes. If you need help quitting, ask your health care provider. It is up to you to get your test results. Ask your health care provider, or the department that is doing the test, when your results will be ready. Keep all follow-up visits. This is important. How is this prevented?  Do not share food, drinking cups, or personal items that could cause the infection to spread to other people. Wash your hands often with soap and water  for at least 20 seconds. If soap and water are not available, use hand sanitizer. Make sure that all people in your house wash their hands well. Have family members tested if they have a sore throat or fever. They may need an antibiotic if they have strep throat. Contact a health care provider if: You have swelling in your neck that keeps getting  bigger. You develop a rash, cough, or earache. You cough up a thick mucus that is green, yellow-brown, or bloody. You have pain or discomfort that does not get better with medicine. Your symptoms seem to be getting worse. You have a fever. Get help right away if: You have new symptoms, such as vomiting, severe headache, stiff or painful neck, chest pain, or shortness of breath. You have severe throat pain, drooling, or changes in your voice. You have swelling of the neck, or the skin on the neck becomes red and tender. You have signs of dehydration, such as tiredness (fatigue), dry mouth, and decreased urination. You become increasingly sleepy, or you cannot wake up completely. Your joints become red or painful. These symptoms may represent a serious problem that is an emergency. Do not wait to see if the symptoms will go away. Get medical help right away. Call your local emergency services (911 in the [place] Do not drive yourself to the hospital. Summary Strep throat is an infection in the throat that is caused by the Streptococcus pyogenes bacteria. This infection is spread from person to person (is contagious) through coughing, sneezing, or having close contact. Take your medicines, including antibiotics, as told by your health care provider. Do not stop taking the antibiotic even if you start to feel better. To prevent the spread of germs, wash your hands well with soap and water. Have others do the same. Do not share food, drinking cups, or personal items. Get help right away if you have new symptoms, such as vomiting, severe headache, stiff or painful neck, chest pain, or shortness of breath. This information is not intended to replace advice given to you by your health care provider. Make sure you discuss any questions you have with your health care provider. Document Revised: 10/29/2020 Document Reviewed: 10/29/2020 Elsevier Patient Education  2024 [employer].

## 2023-07-22 NOTE — Progress Notes (Signed)
 Established Patient Office Visit  Subjective:  Patient ID: Anthony Hale, male    DOB: [birth year]  Age: 38 y.o. MRN: [id number]  CC:  Chief Complaint  Patient presents with   Sore Throat    HPI  Anthony Hale presents for:  Sore Throat  This is a new problem. The current episode started in the past 7 days. The problem has been gradually worsening. The pain is worse on the left side. There has been no fever. Associated symptoms include ear pain, a plugged ear sensation and trouble swallowing. Pertinent negatives include no congestion or coughing. He has had exposure to strep. He has tried nothing for the symptoms.    Past Medical History:  Diagnosis Date   Allergy    Anxiety    Asthma    Depression Early 2017   GERD (gastroesophageal reflux disease)    Hypertriglyceridemia 12/18/2022   Insomnia    Lipoma     Past Surgical History:  Procedure Laterality Date   COLONOSCOPY WITH PROPOFOL  N/A 04/21/2022   Procedure: COLONOSCOPY WITH PROPOFOL ;  Surgeon: Therisa Bi, MD;  Location: [facility] ENDOSCOPY;  Service: Gastroenterology;  Laterality: N/A;   WISDOM TOOTH EXTRACTION      Family History  Problem Relation Age of Onset   Cancer Mother        Breast   Hyperlipidemia Mother    Heart disease Father    Hyperlipidemia Father    Hypertension Father    Cancer Father     Social History   Socioeconomic History   Marital status: Divorced    Spouse name: Not on file   Number of children: 0   Years of education: Not on file   Highest education level: Master's degree (e.g., MA, MS, MEng, MEd, MSW, MBA)  Occupational History   Occupation: Midwife  Tobacco Use   Smoking status: Never   Smokeless tobacco: Never  Vaping Use   Vaping status: Never Used  Substance and Sexual Activity   Alcohol use: Yes    Comment: Only drink maybe once a month but I only have one drink   Drug use: No   Sexual activity: Yes    Partners: Female    Birth control/protection: Condom   Other Topics Concern   Not on file  Social History Narrative   Married on March 19th, 2017, marriage was never consummated and she left on June 25 th, 2017   Divorced Summer 2018   Still work as a insurance underwriter for [employer]   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[employer], [employer] and two masters ( theatre stage manager and criminal justice administration)    Currently Living in [place]   Social Drivers of Health   Financial Resource Strain: Low Risk  (06/04/2022)   Overall Financial Resource Strain (CARDIA)    Difficulty of Paying Living Expenses: Not very hard  Food Insecurity: No Food Insecurity (06/04/2022)   Hunger Vital Sign    Worried About Running Out of Food in the Last Year: Never true    Ran Out of Food in the Last Year: Never true  Transportation Needs: No Transportation Needs (06/04/2022)   PRAPARE - Administrator, Civil Service (Medical): No    Lack of Transportation (Non-Medical): No  Physical Activity: Insufficiently Active (06/04/2022)   Exercise Vital Sign    Days of Exercise per Week: 2 days    Minutes of Exercise per Session: 20 min  Stress: Stress Concern Present (06/04/2022)  [employer] of Occupational Health - Occupational Stress Questionnaire    Feeling of Stress : Very much  Social Connections: Moderately Integrated (06/04/2022)   Social Connection and Isolation Panel [NHANES]    Frequency of Communication with Friends and Family: More than three times a week    Frequency of Social Gatherings with Friends and Family: More than three times a week    Attends Religious Services: More than 4 times per year    Active Member of [employer] or Organizations: Yes    Attends Engineer, Structural: More than 4 times per year    Marital Status: Divorced  Intimate Partner Violence: Not At Risk (06/04/2022)   Humiliation, Afraid, Rape, and Kick questionnaire    Fear of Current or Ex-Partner: No    Emotionally Abused: No     Physically Abused: No    Sexually Abused: No     Outpatient Medications Prior to Visit  Medication Sig Dispense Refill   albuterol  (VENTOLIN  HFA) 108 (90 Base) MCG/ACT inhaler Inhale 2 puffs into the lungs every 6 (six) hours as needed for wheezing or shortness of breath. 18 g 0   cyanocobalamin  (VITAMIN B12) 1000 MCG tablet Take 2,000 mcg by mouth daily.     propranolol  (INDERAL ) 10 MG tablet Take 1 tablet (10 mg total) by mouth 3 (three) times daily. 90 tablet 3   Vilazodone  HCl 20 MG TABS Take one tablet by mouth daily 90 tablet 1   Vitamin D , Ergocalciferol , (DRISDOL ) 1.25 MG (50000 UNIT) CAPS capsule Take 1 capsule (50,000 Units total) by mouth every 7 (seven) days. 12 capsule 3   No facility-administered medications prior to visit.    No Known Allergies  ROS Review of Systems  HENT:  Positive for ear pain and trouble swallowing. Negative for congestion.   Respiratory:  Negative for cough.    Negative unless indicated in HPI.    Objective:    Physical Exam Constitutional:      Appearance: He is well-developed.  HENT:     Right Ear: Tympanic membrane normal.     Left Ear: Tympanic membrane normal.     Mouth/Throat:     Mouth: Mucous membranes are moist.     Pharynx: No pharyngeal swelling or oropharyngeal exudate.     Tonsils: Tonsillar exudate present. 2+ on the right. 2+ on the left.     Comments: White exudate to left tonsils. Eyes:     Conjunctiva/sclera: Conjunctivae normal.  Cardiovascular:     Rate and Rhythm: Normal rate and regular rhythm.     Heart sounds: Normal heart sounds. No murmur heard.    No friction rub.  Pulmonary:     Effort: Pulmonary effort is normal.     Breath sounds: Normal breath sounds.  Musculoskeletal:     Cervical back: Normal range of motion.  Skin:    General: Skin is warm and dry.     Capillary Refill: Capillary refill takes less than 2 seconds.     Findings: No rash.  Neurological:     General: No focal deficit present.      Mental Status: He is alert and oriented to person, place, and time.  Psychiatric:        Mood and Affect: Mood normal.        Behavior: Behavior normal.     BP 124/76   Pulse 69   Temp 98.2 F (36.8 C)   Ht 5' 5.75 (1.67 m)   Wt 177 lb (80.3 kg)  SpO2 99%   BMI 28.79 kg/m  Wt Readings from Last 3 Encounters:  07/22/23 177 lb (80.3 kg)  06/23/23 174 lb 6 oz (79.1 kg)  12/02/22 170 lb (77.1 kg)     Health Maintenance  Topic Date Due   Colonoscopy  04/21/2025   DTaP/Tdap/Td (3 - Td or Tdap) 01/21/2026   INFLUENZA VACCINE  Completed   Hepatitis C Screening  Completed   HIV Screening  Completed   HPV VACCINES  Aged Out   COVID-19 Vaccine  Discontinued    There are no preventive care reminders to display for this patient.  Lab Results  Component Value Date   TSH 0.98 10/20/2022   Lab Results  Component Value Date   WBC 5.5 10/20/2022   HGB 15.4 10/20/2022   HCT 44.8 10/20/2022   MCV 87.3 10/20/2022   PLT 265.0 10/20/2022   Lab Results  Component Value Date   NA 136 10/20/2022   K 4.5 10/20/2022   CO2 29 10/20/2022   GLUCOSE 97 10/20/2022   BUN 12 10/20/2022   CREATININE 1.00 10/20/2022   BILITOT 0.7 10/20/2022   ALKPHOS 66 10/20/2022   AST 21 10/20/2022   ALT 42 10/20/2022   PROT 6.8 10/20/2022   ALBUMIN 4.6 10/20/2022   CALCIUM 9.3 10/20/2022   ANIONGAP 5 10/17/2015   EGFR 78 06/03/2021   GFR 95.08 10/20/2022   Lab Results  Component Value Date   CHOL 143 12/02/2022   Lab Results  Component Value Date   HDL 32.60 (L) 12/02/2022   Lab Results  Component Value Date   LDLCALC 98 12/02/2022   Lab Results  Component Value Date   TRIG 63.0 12/02/2022   Lab Results  Component Value Date   CHOLHDL 4 12/02/2022   Lab Results  Component Value Date   HGBA1C 5.5 10/20/2022      Assessment & Plan:  Strep pharyngitis Assessment & Plan: White exudate to the left tonsils. Tested positive for strep. Will treat with amoxicillin  and  prednisone . Advised to perform salt water gargles and do not share drinks.  Will let us  know if symptoms not improving.    Sore throat -     POCT rapid strep A  Ear pain, left Assessment & Plan: Normal exam, can be related to strep throat. Advised to take OTC tylenol for pain relief.    Orders: -     POCT rapid strep A  Other orders -     Amoxicillin ; Take 1 tablet (875 mg total) by mouth 2 (two) times daily for 10 days.  Dispense: 20 tablet; Refill: 0 -     predniSONE ; Take 1 tablet (20 mg total) by mouth daily with breakfast for 5 days.  Dispense: 5 tablet; Refill: 0    Follow-up: Return if symptoms worsen or fail to improve.   Brandi Tomlinson, NP

## 2023-07-22 NOTE — Assessment & Plan Note (Signed)
 Normal exam, can be related to strep throat. Advised to take OTC tylenol for pain relief.

## 2023-07-23 ENCOUNTER — Ambulatory Visit: Payer: Managed Care, Other (non HMO) | Admitting: Family Medicine

## 2023-07-27 ENCOUNTER — Telehealth: Payer: Self-pay

## 2023-07-27 NOTE — Telephone Encounter (Signed)
 Copied from CRM [id number]. Topic: General - Other >> Jul 27, 2023  4:16 PM Suzette B wrote: Reason for CRM: Please call patient in regard to some insurance paperwork needing to be completed for his company insurance.

## 2023-07-27 NOTE — Telephone Encounter (Signed)
 Called and spoke to pt. He stated that the insurance will not accept the paperwork since his blood work was done 10/20/2022. Will see if Dr. Clent Ridges will order a new panel so pt can come in and get new blood work done. Form is filled out and placed in media.

## 2023-07-28 ENCOUNTER — Other Ambulatory Visit: Payer: Self-pay | Admitting: *Deleted

## 2023-07-28 DIAGNOSIS — Z0000 Encounter for general adult medical examination without abnormal findings: Secondary | ICD-10-CM

## 2023-07-28 NOTE — Telephone Encounter (Signed)
 Patient will find out exactly what he needs done and let us know.

## 2023-07-28 NOTE — Telephone Encounter (Addendum)
 Please review chart first! Pt had a CPE last month but the correct labs needed to complete form were not ordered so the labs from April were placed on form. Insurance will not cover that. Pt simply needs to correct labs ordered & updated on form.  I have pended and sent orders to you to sign off on in a separate encounter.  Please let us  know when that is done so we can have pt return for fasting labs & bring in new paperwork.

## 2023-08-03 NOTE — Telephone Encounter (Signed)
 Pt scheduled lab only appointment, and will bring in paperwork to be filed out.

## 2023-08-20 ENCOUNTER — Other Ambulatory Visit: Payer: Managed Care, Other (non HMO)

## 2023-09-01 ENCOUNTER — Other Ambulatory Visit: Payer: Self-pay | Admitting: Behavioral Health

## 2023-09-01 DIAGNOSIS — F422 Mixed obsessional thoughts and acts: Secondary | ICD-10-CM

## 2023-09-01 DIAGNOSIS — F411 Generalized anxiety disorder: Secondary | ICD-10-CM

## 2023-09-01 DIAGNOSIS — G4700 Insomnia, unspecified: Secondary | ICD-10-CM

## 2023-09-02 ENCOUNTER — Ambulatory Visit: Payer: 59 | Admitting: Behavioral Health

## 2023-09-22 ENCOUNTER — Ambulatory Visit: Payer: 59 | Admitting: Behavioral Health

## 2023-10-12 ENCOUNTER — Encounter: Payer: Self-pay | Admitting: Behavioral Health

## 2023-10-12 ENCOUNTER — Ambulatory Visit: Admitting: Behavioral Health

## 2023-10-12 DIAGNOSIS — F411 Generalized anxiety disorder: Secondary | ICD-10-CM | POA: Diagnosis not present

## 2023-10-12 DIAGNOSIS — F330 Major depressive disorder, recurrent, mild: Secondary | ICD-10-CM

## 2023-10-12 NOTE — Progress Notes (Signed)
 [facility]  Patient ID: Anthony Hale,  MRN: [id number]  PCP: Dana Allan, MD  Date of Evaluation: 10/12/2023 Time spent:30 minutes  Chief Complaint:  Chief Complaint   Depression; Anxiety; Follow-up; Medication Refill; Stress; Patient Education     HISTORY/CURRENT STATUS: HPI 38  year old male presents to this office for follow up and medication management. Says he has been doing well overall. He says that work is stressful but overall things are going well. He has been in new relationship since November 2024.   Medication restart has been helping. Say he is taking Viibryd as presribed. He is requesting refill today. Anxiety today is 3/10 and depression 2/10. 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 He is getting 6-7 hours per night.  He denies mania, no psychosis, no SI/HI.   Past psychiatric medication trials: Wellbutrin Trazodone Prozac   Individual Medical History/ Review of Systems: Changes? :No   Allergies: Patient has no known allergies.  Current Medications:  Current Outpatient Medications:    albuterol (VENTOLIN HFA) 108 (90 Base) MCG/ACT inhaler, Inhale 2 puffs into the lungs every 6 (six) hours as needed for wheezing or shortness of breath., Disp: 18 g, Rfl: 0   cyanocobalamin (VITAMIN B12) 1000 MCG tablet, Take 2,000 mcg by mouth daily., Disp: , Rfl:    propranolol (INDERAL) 10 MG tablet, TAKE 1 TABLET BY MOUTH THREE TIMES A DAY, Disp: 270 tablet, Rfl: 1   Vilazodone HCl 20 MG TABS, Take one tablet by mouth daily, Disp: 90 tablet, Rfl: 1   Vitamin D, Ergocalciferol, (DRISDOL) 1.25 MG (50000 UNIT) CAPS capsule, Take 1 capsule (50,000 Units total) by mouth every 7 (seven) days., Disp: 12 capsule, Rfl: 3 Medication Side Effects: none  Family Medical/ Social History: Changes? No  MENTAL HEALTH EXAM:  There were no vitals taken for this visit.There is no height or weight on file to calculate BMI.  General  Appearance: Casual and Neat  Eye Contact:  Good  Speech:  Clear and Coherent  Volume:  Normal  Mood:  NA  Affect:  Appropriate  Thought Process:  Coherent  Orientation:  Full (Time, Place, and Person)  Thought Content: Logical   Suicidal Thoughts:  No  Homicidal Thoughts:  No  Memory:  WNL  Judgement:  Good  Insight:  Good  Psychomotor Activity:  NA  Concentration:  Concentration: Good  Recall:  Good  Fund of Knowledge: Good  Language: Good  Assets:  Desire for Improvement  ADL's:  Intact  Cognition: WNL  Prognosis:  Good    DIAGNOSES:    ICD-10-CM   1. Generalized anxiety disorder  F41.1     2. Depression, major, recurrent, mild (HCC)  F33.0       Receiving Psychotherapy: No    RECOMMENDATIONS:   Greater than 50% of 30 min face to face time with patient was spent on counseling and coordination of care. He says his work situation has been slowly improving.  He is in new relationship since November 2024. He is happy with his medication.   We agreed to:  Continue Viibryd to 20 mg daily.  To continue Trazodone 50 mg for sleep. 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  To report any side effects or worsening symptoms promptly Will follow up in 6 months weeks to reassess Reinforced taking medications exactly as prescribed and reviewed good  sleep hygiene.  Provided emergency contact information  Reviewed PDMP       Joan Flores, NP

## 2023-10-22 ENCOUNTER — Encounter: Payer: Self-pay | Admitting: Family Medicine

## 2023-10-22 ENCOUNTER — Ambulatory Visit: Admitting: Family Medicine

## 2023-10-22 VITALS — BP 130/82 | HR 72 | Temp 98.2°F | Resp 20 | Ht 65.75 in | Wt 180.0 lb

## 2023-10-22 DIAGNOSIS — J358 Other chronic diseases of tonsils and adenoids: Secondary | ICD-10-CM

## 2023-10-22 DIAGNOSIS — J45991 Cough variant asthma: Secondary | ICD-10-CM | POA: Diagnosis not present

## 2023-10-22 NOTE — Patient Instructions (Addendum)
 It was a pleasure meeting you today. Thank you for allowing me to take part in your health care.  Our goals for today as we discussed include:  If any worsening symptoms please notify MD    This is a list of the screening recommended for you and due dates:  Health Maintenance  Topic Date Due   Flu Shot  02/18/2024   Colon Cancer Screening  04/21/2025   DTaP/Tdap/Td vaccine (3 - Td or Tdap) 01/21/2026   Pneumococcal Vaccination  Completed   Hepatitis C Screening  Completed   HIV Screening  Completed   HPV Vaccine  Aged Out   COVID-19 Vaccine  Discontinued     If you have any questions or concerns, please do not hesitate to call the office at [phone].  I look forward to our next visit and until then take care and stay safe.  Regards,   Dana Allan, MD   [facility]
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
## 2023-11-03 ENCOUNTER — Encounter: Payer: Self-pay | Admitting: Family Medicine

## 2023-11-03 DIAGNOSIS — J358 Other chronic diseases of tonsils and adenoids: Secondary | ICD-10-CM | POA: Insufficient documentation

## 2023-11-03 MED ORDER — ALBUTEROL SULFATE HFA 108 (90 BASE) MCG/ACT IN AERS: 2.0000 | INHALATION_SPRAY | Freq: Four times a day (QID) | RESPIRATORY_TRACT | 0 refills | Status: AC | PRN

## 2023-11-03 NOTE — Assessment & Plan Note (Signed)
 Allergy-induced asthma managed with albuterol. Discussed Aerochamber use to improve medication delivery and reduce throat irritation. Emphasized rinsing after steroid inhalers to prevent thrush. - Provide an Aerochamber to use with the albuterol inhaler. - Educate on the importance of rinsing the mouth after using inhalers, especially those containing steroids. - Monitor asthma symptoms and inhaler use frequency.

## 2023-11-03 NOTE — Assessment & Plan Note (Signed)
 Suspected tonsil stone due to appearance and lack of systemic symptoms. Advised on oral hygiene measures. Immediate medical attention if symptoms worsen. - Advise increased brushing frequency to three or four times a day. - Recommend gargling with mouthwash or saline solution. - If symptoms persist, consider referral to ENT for further evaluation. - Instruct to monitor for worsening symptoms such as fever, inability to eat or drink, or swelling, and to report these immediately.

## 2023-11-24 ENCOUNTER — Ambulatory Visit: Admitting: Behavioral Health

## 2024-02-11 ENCOUNTER — Encounter: Payer: Self-pay | Admitting: Nurse Practitioner

## 2024-02-11 ENCOUNTER — Ambulatory Visit (INDEPENDENT_AMBULATORY_CARE_PROVIDER_SITE_OTHER): Admitting: Nurse Practitioner

## 2024-02-11 VITALS — BP 120/78 | HR 58 | Temp 98.1°F | Ht 65.75 in | Wt 177.4 lb

## 2024-02-11 DIAGNOSIS — J358 Other chronic diseases of tonsils and adenoids: Secondary | ICD-10-CM

## 2024-02-11 DIAGNOSIS — K219 Gastro-esophageal reflux disease without esophagitis: Secondary | ICD-10-CM | POA: Diagnosis not present

## 2024-02-11 MED ORDER — OMEPRAZOLE 20 MG PO CPDR: 20.0000 mg | DELAYED_RELEASE_CAPSULE | Freq: Every day | ORAL | 0 refills | Status: DC

## 2024-02-11 NOTE — Progress Notes (Signed)
 Established Patient Office Visit  Subjective:  Patient ID: Anthony Hale, male    DOB: 1986/02/26  Age: 38 y.o. MRN: [id number]  CC:  Chief Complaint  Patient presents with   Acute Visit    White spot on Right tonsil x 2-3 months UC did strep test -Negative Denies any pain, sore throat, fever or any other symptoms Dr. Hope saw Patient diagnosed with tonsil stone and said if still there in a few weeks follow up  Discussed the use of a AI scribe software for clinical note transcription with the patient, who gave verbal consent to proceed.   HPI  Anthony Hale presents with a persistent tonsil stone on the right tonsil.        He has a persistent white spot on his right  tonsil for a couple of months. He has negative rapid strep test. Was treated with antibiotics did not resolve the issue. Salt water and mouthwash rinses have been ineffective. There is no pain when swallowing or halitosis.  He experiences sharp, brief chest pains, sometimes associated with bending over, and considers a possible link to acid reflux.   He uses an albuterol  inhaler as needed, with usage once or twice this year. No palpitations are present.  He has a history of allergies, previously managed with Xyzal , now using an over-the-counter medication due to cost. He discontinued allergy shots for the same reason. He has quit smoking, but his girlfriend vapes.  HPI   Past Medical History:  Diagnosis Date   Allergy    Anxiety    Asthma    Depression Early 2017   GERD (gastroesophageal reflux disease)    Hypertriglyceridemia 12/18/2022   Insomnia    Lipoma     Past Surgical History:  Procedure Laterality Date   COLONOSCOPY WITH PROPOFOL  N/A 04/21/2022   Procedure: COLONOSCOPY WITH PROPOFOL ;  Surgeon: Therisa Bi, MD;  Location: [facility] ENDOSCOPY;  Service: Gastroenterology;  Laterality: N/A;   WISDOM TOOTH EXTRACTION      Family History  Problem Relation Age of Onset   Cancer Mother        Breast    Hyperlipidemia Mother    Heart disease Father    Hyperlipidemia Father    Hypertension Father    Cancer Father     Social History   Socioeconomic History   Marital status: Divorced    Spouse name: Not on file   Number of children: 0   Years of education: Not on file   Highest education level: Master's degree (e.g., MA, MS, MEng, MEd, MSW, MBA)  Occupational History   Occupation: Midwife  Tobacco Use   Smoking status: Never   Smokeless tobacco: Never  Vaping Use   Vaping status: Never Used  Substance and Sexual Activity   Alcohol use: Yes    Comment: Only drink maybe once a month but I only have one drink   Drug use: No   Sexual activity: Yes    Partners: Female    Birth control/protection: Condom  Other Topics Concern   Not on file  Social History Narrative   Married on March 19th, 2017, marriage was never consummated and she left on June 25 th, 2017   Divorced Summer 2018   Still work as a Insurance underwriter for [employer]   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[employer], [employer] and two masters ( Theatre stage manager and criminal justice administration)    Currently Living in [place]   Social  Drivers of Health   Financial Resource Strain: Low Risk  (06/04/2022)   Overall Financial Resource Strain (CARDIA)    Difficulty of Paying Living Expenses: Not very hard  Food Insecurity: No Food Insecurity (06/04/2022)   Hunger Vital Sign    Worried About Running Out of Food in the Last Year: Never true    Ran Out of Food in the Last Year: Never true  Transportation Needs: No Transportation Needs (06/04/2022)   PRAPARE - Administrator, Civil Service (Medical): No    Lack of Transportation (Non-Medical): No  Physical Activity: Insufficiently Active (06/04/2022)   Exercise Vital Sign    Days of Exercise per Week: 2 days    Minutes of Exercise per Session: 20 min  Stress: Stress Concern Present (06/04/2022)   [employer] of  Occupational Health - Occupational Stress Questionnaire    Feeling of Stress : Very much  Social Connections: Moderately Integrated (06/04/2022)   Social Connection and Isolation Panel    Frequency of Communication with Friends and Family: More than three times a week    Frequency of Social Gatherings with Friends and Family: More than three times a week    Attends Religious Services: More than 4 times per year    Active Member of [employer] or Organizations: Yes    Attends Engineer, structural: More than 4 times per year    Marital Status: Divorced  Intimate Partner Violence: Not At Risk (06/04/2022)   Humiliation, Afraid, Rape, and Kick questionnaire    Fear of Current or Ex-Partner: No    Emotionally Abused: No    Physically Abused: No    Sexually Abused: No     Outpatient Medications Prior to Visit  Medication Sig Dispense Refill   albuterol  (VENTOLIN  HFA) 108 (90 Base) MCG/ACT inhaler Inhale 2 puffs into the lungs every 6 (six) hours as needed for wheezing or shortness of breath. 18 g 0   propranolol  (INDERAL ) 10 MG tablet TAKE 1 TABLET BY MOUTH THREE TIMES A DAY 270 tablet 1   Vilazodone  HCl 20 MG TABS Take one tablet by mouth daily 90 tablet 1   Vitamin D , Ergocalciferol , (DRISDOL ) 1.25 MG (50000 UNIT) CAPS capsule Take 1 capsule (50,000 Units total) by mouth every 7 (seven) days. 12 capsule 3   cyanocobalamin  (VITAMIN B12) 1000 MCG tablet Take 2,000 mcg by mouth daily.     No facility-administered medications prior to visit.    No Known Allergies  ROS Review of Systems Negative unless indicated in HPI.    Objective:    Physical Exam  BP 120/78   Pulse (!) 58   Temp 98.1 F (36.7 C)   Ht 5' 5.75 (1.67 m)   Wt 177 lb 6.4 oz (80.5 kg)   SpO2 98%   BMI 28.85 kg/m  Wt Readings from Last 3 Encounters:  02/11/24 177 lb 6.4 oz (80.5 kg)  10/22/23 180 lb (81.6 kg)  07/22/23 177 lb (80.3 kg)     Health Maintenance  Topic Date Due   Hepatitis B Vaccines (3  of 3 - 19+ 3-dose series) 08/21/2005   HPV VACCINES (1 - 3-dose SCDM series) Never done   INFLUENZA VACCINE  02/18/2024   Colonoscopy  04/21/2025   DTaP/Tdap/Td (3 - Td or Tdap) 01/21/2026   Pneumococcal Vaccine: 19-49 Years  Completed   Hepatitis C Screening  Completed   HIV Screening  Completed   Meningococcal B Vaccine  Aged Out   COVID-19 Vaccine  Discontinued  Topic Date Due   Hepatitis B Vaccines (3 of 3 - 19+ 3-dose series) 08/21/2005   HPV VACCINES (1 - 3-dose SCDM series) Never done    Lab Results  Component Value Date   TSH 0.98 10/20/2022   Lab Results  Component Value Date   WBC 5.5 10/20/2022   HGB 15.4 10/20/2022   HCT 44.8 10/20/2022   MCV 87.3 10/20/2022   PLT 265.0 10/20/2022   Lab Results  Component Value Date   NA 136 10/20/2022   K 4.5 10/20/2022   CO2 29 10/20/2022   GLUCOSE 97 10/20/2022   BUN 12 10/20/2022   CREATININE 1.00 10/20/2022   BILITOT 0.7 10/20/2022   ALKPHOS 66 10/20/2022   AST 21 10/20/2022   ALT 42 10/20/2022   PROT 6.8 10/20/2022   ALBUMIN 4.6 10/20/2022   CALCIUM 9.3 10/20/2022   ANIONGAP 5 10/17/2015   EGFR 78 06/03/2021   GFR 95.08 10/20/2022   Lab Results  Component Value Date   CHOL 143 12/02/2022   Lab Results  Component Value Date   HDL 32.60 (L) 12/02/2022   Lab Results  Component Value Date   LDLCALC 98 12/02/2022   Lab Results  Component Value Date   TRIG 63.0 12/02/2022   Lab Results  Component Value Date   CHOLHDL 4 12/02/2022   Lab Results  Component Value Date   HGBA1C 5.5 10/20/2022      Assessment & Plan:  Tonsillith Assessment & Plan: Persistent left tonsil stone for 2-3 months, no pain or malodor. Previous antibiotics ineffective. Appearance consistent with tonsil stone. Family history of oral cancer noted. Recommended ENT referral for evaluation and management.  - Refer to ENT for further evaluation and potential removal of tonsil stone.   Gastroesophageal reflux disease,  unspecified whether esophagitis present Assessment & Plan: ntermittent sharp chest pain and burning sensation suggestive of GERD, exacerbated by bending over. Symptoms align with GERD. Discussed starting omeprazole  for acid reduction. - Prescribe omeprazole  for acid reduction. - Advise purchase of over-the-counter omeprazole  if more cost-effective.    Other orders -     Omeprazole ; Take 1 capsule (20 mg total) by mouth daily.  Dispense: 30 capsule; Refill: 0    Follow-up: Return in about 3 weeks (around 03/03/2024) for GERD.   Lumir Demetriou, NP

## 2024-02-23 NOTE — Assessment & Plan Note (Signed)
 Persistent left tonsil stone for 2-3 months, no pain or malodor. Previous antibiotics ineffective. Appearance consistent with tonsil stone. Family history of oral cancer noted. Recommended ENT referral for evaluation and management.  - Refer to ENT for further evaluation and potential removal of tonsil stone.

## 2024-02-23 NOTE — Assessment & Plan Note (Signed)
 ntermittent sharp chest pain and burning sensation suggestive of GERD, exacerbated by bending over. Symptoms align with GERD. Discussed starting omeprazole  for acid reduction. - Prescribe omeprazole  for acid reduction. - Advise purchase of over-the-counter omeprazole  if more cost-effective.

## 2024-03-03 ENCOUNTER — Ambulatory Visit: Admitting: Nurse Practitioner

## 2024-04-13 ENCOUNTER — Ambulatory Visit: Admitting: Behavioral Health

## 2024-04-27 ENCOUNTER — Ambulatory Visit: Admitting: Nurse Practitioner

## 2024-05-04 ENCOUNTER — Ambulatory Visit: Admitting: Nurse Practitioner

## 2024-05-12 ENCOUNTER — Ambulatory Visit: Payer: Self-pay

## 2024-05-12 NOTE — Telephone Encounter (Signed)
 Please advise pt to go to urgent care or schedule virtual appointment via mychart.

## 2024-05-12 NOTE — Telephone Encounter (Signed)
 FYI Only or Action Required?: FYI only for provider.  Patient was last seen in primary care on 02/11/2024 by Vincente Saber, NP.  Called Nurse Triage reporting Cough.  Symptoms began 1-2 months ago.  Interventions attempted: OTC medications: VapoCOOL cough medicine and cough drops and Prescription medications: albuterol  inhaler.  Symptoms are: unchanged.  Triage Disposition: See Physician Within 24 Hours  Patient/caregiver understands and will follow disposition?: Yes                            Copied from CRM [id number]. Topic: Clinical - Red Word Triage >> May 12, 2024  8:34 AM Viola F wrote: Patient having cough, mucus, and chest feels tight Reason for Disposition  SEVERE coughing spells (e.g., whooping sound after coughing, vomiting after coughing)  Answer Assessment - Initial Assessment Questions 1. ONSET: When did the cough begin?      1-2 months 2. SEVERITY: How bad is the cough today?      Moderate- states cough is non-stop and he coughs so hard his back is sore 3. SPUTUM: Describe the color of your sputum (e.g., none, dry cough; clear, white, yellow, green)     Unsure 4. HEMOPTYSIS: Are you coughing up any blood? If Yes, ask: How much? (e.g., flecks, streaks, tablespoons, etc.)     Denies 5. DIFFICULTY BREATHING: Are you having difficulty breathing? If Yes, ask: How bad is it? (e.g., mild, moderate, severe)      Denies, but states sometimes when he lays flat his chest feels tight and it gets harder to breathe, patient able to speak in clear and complete sentences while on phone with this RN 6. FEVER: Do you have a fever? If Yes, ask: What is your temperature, how was it measured, and when did it start?     Denies 7. CARDIAC HISTORY: Do you have any history of heart disease? (e.g., heart attack, congestive heart failure)      Denies 8. LUNG HISTORY: Do you have any history of lung disease?  (e.g., pulmonary embolus,  asthma, emphysema)     Asthma 9. PE RISK FACTORS: Do you have a history of blood clots? (or: recent major surgery, recent prolonged travel, bedridden)     Denies 10. OTHER SYMPTOMS: Do you have any other symptoms? (e.g., runny nose, wheezing, chest pain)     Chest congestion, states symptoms started out as runny nose and nasal drainage, denies wheezing    No availability in office until late next week. This RN advised UC. 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 This RN advised patient to call back if symptoms worsen of if he needs additional assistance. Patient verbalized understanding.  Protocols used: Cough - Acute Productive-A-AH

## 2024-05-12 NOTE — Telephone Encounter (Signed)
 I do not see any available appointments in our office. Urgent care?

## 2024-05-16 ENCOUNTER — Ambulatory Visit
Admission: RE | Admit: 2024-05-16 | Discharge: 2024-05-16 | Disposition: A | Source: Ambulatory Visit | Attending: Physician Assistant | Admitting: Physician Assistant

## 2024-05-16 ENCOUNTER — Other Ambulatory Visit: Payer: Self-pay | Admitting: Physician Assistant

## 2024-05-16 DIAGNOSIS — R053 Chronic cough: Secondary | ICD-10-CM

## 2024-05-18 ENCOUNTER — Other Ambulatory Visit: Payer: Self-pay | Admitting: Physician Assistant

## 2024-05-18 DIAGNOSIS — R911 Solitary pulmonary nodule: Secondary | ICD-10-CM

## 2024-05-23 ENCOUNTER — Inpatient Hospital Stay
Admission: RE | Admit: 2024-05-23 | Discharge: 2024-05-23 | Disposition: A | Source: Ambulatory Visit | Attending: Physician Assistant | Admitting: Physician Assistant

## 2024-05-23 DIAGNOSIS — R911 Solitary pulmonary nodule: Secondary | ICD-10-CM

## 2024-05-23 MED ORDER — IOPAMIDOL (ISOVUE-300) INJECTION 61%
75.0000 mL | Freq: Once | INTRAVENOUS | Status: AC | PRN
  Administered 2024-05-23: 75 mL via INTRAVENOUS

## 2024-05-29 ENCOUNTER — Ambulatory Visit: Admitting: Behavioral Health

## 2024-06-08 ENCOUNTER — Ambulatory Visit: Admitting: Behavioral Health

## 2024-06-14 ENCOUNTER — Telehealth: Payer: Self-pay

## 2024-06-14 NOTE — Telephone Encounter (Signed)
 I left voicemail for patient letting him know that we just found out that Chelsea Aurora, NP, will be out of the office on 07/28/2024.  I asked patient to please call us  to reschedule his appointment.  I also sent a message to patient via MyChart.  E2C2 - when patient calls back, please assist him with rescheduling his TOC appointment.

## 2024-07-06 ENCOUNTER — Telehealth: Admitting: Physician Assistant

## 2024-07-06 DIAGNOSIS — J0110 Acute frontal sinusitis, unspecified: Secondary | ICD-10-CM

## 2024-07-06 MED ORDER — AMOXICILLIN-POT CLAVULANATE 875-125 MG PO TABS: 1.0000 | ORAL_TABLET | Freq: Two times a day (BID) | ORAL | 0 refills | Status: DC

## 2024-07-06 MED ORDER — PREDNISONE 10 MG (21) PO TBPK: ORAL_TABLET | ORAL | 0 refills | Status: DC

## 2024-07-06 NOTE — Patient Instructions (Signed)
 Anthony Hale, thank you for joining Anthony Velma Lunger, PA-C for today's virtual visit.  While this provider is not your primary care provider (PCP), if your PCP is located in our provider database this encounter information will be shared with them immediately following your visit.   A [place] MyChart account gives you access to today's visit and all your visits, tests, and labs performed at [facility]  click here if you don't have a [place] MyChart account or go to [email or web address]  Consent: (Patient) Anthony Hale provided verbal consent for this virtual visit at the beginning of the encounter.  Current Medications:  Current Outpatient Medications:    albuterol  (VENTOLIN  HFA) 108 (90 Base) MCG/ACT inhaler, Inhale 2 puffs into the lungs every 6 (six) hours as needed for wheezing or shortness of breath., Disp: 18 g, Rfl: 0   cyanocobalamin  (VITAMIN B12) 1000 MCG tablet, Take 2,000 mcg by mouth daily., Disp: , Rfl:    omeprazole  (PRILOSEC) 20 MG capsule, Take 1 capsule (20 mg total) by mouth daily., Disp: 30 capsule, Rfl: 0   propranolol  (INDERAL ) 10 MG tablet, TAKE 1 TABLET BY MOUTH THREE TIMES A DAY, Disp: 270 tablet, Rfl: 1   Vilazodone  HCl 20 MG TABS, Take one tablet by mouth daily, Disp: 90 tablet, Rfl: 1   Vitamin D , Ergocalciferol , (DRISDOL ) 1.25 MG (50000 UNIT) CAPS capsule, Take 1 capsule (50,000 Units total) by mouth every 7 (seven) days., Disp: 12 capsule, Rfl: 3   Medications ordered in this encounter:  No orders of the defined types were placed in this encounter.    *If you need refills on other medications prior to your next appointment, please contact your pharmacy*  Follow-Up: Call back or seek an in-person evaluation if the symptoms worsen or if the condition fails to improve as anticipated.  [facility] [phone]  Other Instructions Continue hydration and your OTC medication regimen.Use Saline nasal spray.  Take a  daily multivitamin. Take the steroid pack as directed.  Place a humidifier in the bedroom.  If things are not improving over next 3 days or so, or any continued worsening of symptoms, start the Augmentin , taking as directed. If you note any non-resolving, new, or worsening symptoms despite treatment, please seek an in-person evaluation ASAP.   Sinusitis Sinusitis is redness, soreness, and swelling (inflammation) of the paranasal sinuses. Paranasal sinuses are air pockets within the bones of your face (beneath the eyes, the middle of the forehead, or above the eyes). In healthy paranasal sinuses, mucus is able to drain out, and air is able to circulate through them by way of your nose. However, when your paranasal sinuses are inflamed, mucus and air can become trapped. This can allow bacteria and other germs to grow and cause infection. Sinusitis can develop quickly and last only a short time (acute) or continue over a long period (chronic). Sinusitis that lasts for more than 12 weeks is considered chronic.  CAUSES  Causes of sinusitis include: Allergies. Structural abnormalities, such as displacement of the cartilage that separates your nostrils (deviated septum), which can decrease the air flow through your nose and sinuses and affect sinus drainage. Functional abnormalities, such as when the small hairs (cilia) that line your sinuses and help remove mucus do not work properly or are not present. SYMPTOMS  Symptoms of acute and chronic sinusitis are the same. The primary symptoms are pain and pressure around the affected sinuses. Other symptoms include: Upper toothache. Earache. Headache. Bad  breath. Decreased sense of smell and taste. A cough, which worsens when you are lying flat. Fatigue. Fever. Thick drainage from your nose, which often is green and may contain pus (purulent). Swelling and warmth over the affected sinuses. DIAGNOSIS  Your caregiver will perform a physical exam. During  the exam, your caregiver may: Look in your nose for signs of abnormal growths in your nostrils (nasal polyps). Tap over the affected sinus to check for signs of infection. View the inside of your sinuses (endoscopy) with a special imaging device with a light attached (endoscope), which is inserted into your sinuses. If your caregiver suspects that you have chronic sinusitis, one or more of the following tests may be recommended: Allergy tests. Nasal culture A sample of mucus is taken from your nose and sent to a lab and screened for bacteria. Nasal cytology A sample of mucus is taken from your nose and examined by your caregiver to determine if your sinusitis is related to an allergy. TREATMENT  Most cases of acute sinusitis are related to a viral infection and will resolve on their own within 10 days. Sometimes medicines are prescribed to help relieve symptoms (pain medicine, decongestants, nasal steroid sprays, or saline sprays).  However, for sinusitis related to a bacterial infection, your caregiver will prescribe antibiotic medicines. These are medicines that will help kill the bacteria causing the infection.  Rarely, sinusitis is caused by a fungal infection. In theses cases, your caregiver will prescribe antifungal medicine. For some cases of chronic sinusitis, surgery is needed. Generally, these are cases in which sinusitis recurs more than 3 times per year, despite other treatments. HOME CARE INSTRUCTIONS  Drink plenty of water. Water helps thin the mucus so your sinuses can drain more easily. Use a humidifier. Inhale steam 3 to 4 times a day (for example, sit in the bathroom with the shower running). Apply a warm, moist washcloth to your face 3 to 4 times a day, or as directed by your caregiver. Use saline nasal sprays to help moisten and clean your sinuses. Take over-the-counter or prescription medicines for pain, discomfort, or fever only as directed by your caregiver. SEEK IMMEDIATE  MEDICAL CARE IF: You have increasing pain or severe headaches. You have nausea, vomiting, or drowsiness. You have swelling around your face. You have vision problems. You have a stiff neck. You have difficulty breathing. MAKE SURE YOU:  Understand these instructions. Will watch your condition. Will get help right away if you are not doing well or get worse. Document Released: 07/06/2005 Document Revised: 09/28/2011 Document Reviewed: 07/21/2011 [facility] Patient Information 2014 [place], [place].    If you have been instructed to have an in-person evaluation today at a local Urgent Care facility, please use the link below. It will take you to a list of all of our available [facility], including address, phone number and hours of operation. Please do not delay care.  [facility]  If you or a family member do not have a primary care provider, use the link below to schedule a visit and establish care. When you choose a [facility] primary care physician or advanced practice provider, you gain a long-term partner in health. Find a Primary Care Provider  Learn more about [facility]'s in-office and virtual care options: [place] - Get Care Now

## 2024-07-06 NOTE — Progress Notes (Signed)
 Virtual Visit Consent   Anthony Hale, you are scheduled for a virtual visit with a [facility] provider today. Just as with appointments in the office, your consent must be obtained to participate. Your consent will be active for this visit and any virtual visit you may have with one of our providers in the next 365 days. If you have a MyChart account, a copy of this consent can be sent to you electronically.  As this is a virtual visit, video technology does not allow for your provider to perform a traditional examination. This may limit your provider's ability to fully assess your condition. If your provider identifies any concerns that need to be evaluated in person or the need to arrange testing (such as labs, EKG, etc.), we will make arrangements to do so. Although advances in technology are sophisticated, we cannot ensure that it will always work on either your end or our end. If the connection with a video visit is poor, the visit may have to be switched to a telephone visit. With either a video or telephone visit, we are not always able to ensure that we have a secure connection.  By engaging in this virtual visit, you consent to the provision of healthcare and authorize for your insurance to be billed (if applicable) for the services provided during this visit. Depending on your insurance coverage, you may receive a charge related to this service.  I need to obtain your verbal consent now. Are you willing to proceed with your visit today? Anthony Hale has provided verbal consent on 07/06/2024 for a virtual visit (video or telephone). Anthony Hale, [place]  Date: 07/06/2024 8:53 AM   Virtual Visit via Video Note   I, Anthony Hale, connected with  Anthony Hale  ([id number], 1986-03-16) on 07/06/2024 at  8:30 AM EST by a video-enabled telemedicine application and verified that I am speaking with the correct person using two identifiers.  Location: Patient: Virtual Visit  Location Patient: Home Provider: Virtual Visit Location Provider: Home Office   I discussed the limitations of evaluation and management by telemedicine and the availability of in person appointments. The patient expressed understanding and agreed to proceed.    History of Present Illness: Anthony Hale is a 38 y.o. who identifies as a male who was assigned male at birth, and is being seen today for nasal congestion, post-nasal drip progressing to sinus pressure, sinus headache and thick yellow nasal discharge, starting at the beginning of the week. Denies fever, chills or aches. Notes bilateral ear pressure and popping. Denies recent travel. Has started OTC Sudafed, Flonase  and antihistamine with only slight help in symptoms.   HPI: HPI  Problems:  Patient Active Problem List   Diagnosis Date Noted   Tonsillith 11/03/2023   Strep pharyngitis 07/22/2023   Ear pain, left 07/22/2023   Encounter for screening for HIV 06/27/2023   Annual physical exam 06/27/2023   Cerumen impaction 06/27/2023   Hepatic steatosis 12/18/2022   Hypertriglyceridemia 12/18/2022   Vitamin B12 deficiency 12/01/2022   Vitamin D  deficiency 12/01/2022   Mood disorder 10/25/2022   Overweight (BMI 25.0-29.9) 10/25/2022   Abdominal hernia without obstruction and without gangrene 10/25/2022   Rectal bleeding    Adenomatous polyp of colon    Lateral epicondylitis, left elbow 03/18/2020   Encounter for biometric screening 10/09/2019   Depression, major, recurrent, 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  Resting comfortably  at home.  Head is normocephalic, atraumatic.  No labored breathing.  Speech is clear and coherent with logical content.  Patient is alert and oriented at baseline.   Assessment and Plan: 1. Acute non-recurrent frontal sinusitis (Primary) - predniSONE  (STERAPRED UNI-PAK 21 TAB) 10 MG (21) TBPK tablet; Take following package directions  Dispense: 21 tablet; Refill: 0  Discussed viral is most likely etiology. Supportive measures reviewed. Continue OTC medications. Start Sterapred pack per orders. Did place Augmentin  on file to start only if symptoms are not improving over next 3-4 days, new onset of fever or worsening sinus pain.  Follow Up Instructions: I discussed the assessment and treatment plan with the patient. The patient was provided an opportunity to ask questions and all were answered. The patient agreed with the plan and demonstrated an understanding of the instructions.  A copy of instructions were sent to the patient via MyChart unless otherwise noted below.   The patient was advised to call back or seek an in-person evaluation if the symptoms worsen or if the condition fails to improve as anticipated.    Anthony Velma Lunger, PA-C    [1] No Known Allergies [2]  Current Outpatient Medications:    amoxicillin -clavulanate (AUGMENTIN ) 875-125 MG tablet, Take 1 tablet by mouth 2 (two) times daily., Disp: 14 tablet, Rfl: 0   predniSONE  (STERAPRED UNI-PAK 21 TAB) 10 MG (21) TBPK tablet, Take following package directions, Disp: 21 tablet, Rfl: 0   albuterol  (VENTOLIN  HFA) 108 (90 Base) MCG/ACT inhaler, Inhale 2 puffs into the lungs every 6 (six) hours as needed for wheezing or shortness of breath., Disp: 18 g, Rfl: 0   cyanocobalamin  (VITAMIN B12) 1000 MCG tablet, Take 2,000 mcg by mouth daily., Disp: , Rfl:    omeprazole  (PRILOSEC) 20 MG capsule, Take 1 capsule (20 mg total) by mouth daily., Disp: 30 capsule, Rfl: 0    propranolol  (INDERAL ) 10 MG tablet, TAKE 1 TABLET BY MOUTH THREE TIMES A DAY, Disp: 270 tablet, Rfl: 1   Vilazodone  HCl 20 MG TABS, Take one tablet by mouth daily, Disp: 90 tablet, Rfl: 1   Vitamin D , Ergocalciferol , (DRISDOL ) 1.25 MG (50000 UNIT) CAPS capsule, Take 1 capsule (50,000 Units total) by mouth every 7 (seven) days., Disp: 12 capsule, Rfl: 3

## 2024-07-26 ENCOUNTER — Telehealth: Payer: Self-pay | Admitting: Family

## 2024-07-26 ENCOUNTER — Encounter: Payer: Self-pay | Admitting: Family

## 2024-07-26 ENCOUNTER — Ambulatory Visit: Payer: Self-pay | Admitting: Family

## 2024-07-26 VITALS — BP 126/82 | HR 68 | Temp 98.3°F | Ht 65.0 in | Wt 173.0 lb

## 2024-07-26 DIAGNOSIS — R053 Chronic cough: Secondary | ICD-10-CM | POA: Diagnosis not present

## 2024-07-26 DIAGNOSIS — F330 Major depressive disorder, recurrent, mild: Secondary | ICD-10-CM

## 2024-07-26 DIAGNOSIS — K760 Fatty (change of) liver, not elsewhere classified: Secondary | ICD-10-CM | POA: Diagnosis not present

## 2024-07-26 DIAGNOSIS — Z0000 Encounter for general adult medical examination without abnormal findings: Secondary | ICD-10-CM

## 2024-07-26 DIAGNOSIS — Z13220 Encounter for screening for lipoid disorders: Secondary | ICD-10-CM

## 2024-07-26 DIAGNOSIS — Z1211 Encounter for screening for malignant neoplasm of colon: Secondary | ICD-10-CM

## 2024-07-26 DIAGNOSIS — Z136 Encounter for screening for cardiovascular disorders: Secondary | ICD-10-CM | POA: Diagnosis not present

## 2024-07-26 LAB — CBC WITH DIFFERENTIAL/PLATELET
Basophils Absolute: 0 K/uL (ref 0.0–0.1)
Basophils Relative: 0.5 % (ref 0.0–3.0)
Eosinophils Absolute: 0.1 K/uL (ref 0.0–0.7)
Eosinophils Relative: 1.4 % (ref 0.0–5.0)
HCT: 44.1 % (ref 39.0–52.0)
Hemoglobin: 15.3 g/dL (ref 13.0–17.0)
Lymphocytes Relative: 28 % (ref 12.0–46.0)
Lymphs Abs: 1.4 K/uL (ref 0.7–4.0)
MCHC: 34.7 g/dL (ref 30.0–36.0)
MCV: 86.6 fl (ref 78.0–100.0)
Monocytes Absolute: 0.5 K/uL (ref 0.1–1.0)
Monocytes Relative: 10.5 % (ref 3.0–12.0)
Neutro Abs: 3 K/uL (ref 1.4–7.7)
Neutrophils Relative %: 59.6 % (ref 43.0–77.0)
Platelets: 278 K/uL (ref 150.0–400.0)
RBC: 5.09 Mil/uL (ref 4.22–5.81)
RDW: 13.3 % (ref 11.5–15.5)
WBC: 5 K/uL (ref 4.0–10.5)

## 2024-07-26 LAB — COMPREHENSIVE METABOLIC PANEL WITH GFR
ALT: 27 U/L (ref 3–53)
AST: 15 U/L (ref 5–37)
Albumin: 4.7 g/dL (ref 3.5–5.2)
Alkaline Phosphatase: 71 U/L (ref 39–117)
BUN: 14 mg/dL (ref 6–23)
CO2: 28 meq/L (ref 19–32)
Calcium: 9.4 mg/dL (ref 8.4–10.5)
Chloride: 101 meq/L (ref 96–112)
Creatinine, Ser: 0.99 mg/dL (ref 0.40–1.50)
GFR: 95.05 mL/min
Glucose, Bld: 102 mg/dL — ABNORMAL HIGH (ref 70–99)
Potassium: 4.2 meq/L (ref 3.5–5.1)
Sodium: 138 meq/L (ref 135–145)
Total Bilirubin: 1 mg/dL (ref 0.2–1.2)
Total Protein: 6.9 g/dL (ref 6.0–8.3)

## 2024-07-26 LAB — LIPID PANEL
Cholesterol: 169 mg/dL (ref 28–200)
HDL: 29.7 mg/dL — ABNORMAL LOW
LDL Cholesterol: 116 mg/dL — ABNORMAL HIGH (ref 10–99)
NonHDL: 138.95
Total CHOL/HDL Ratio: 6
Triglycerides: 115 mg/dL (ref 10.0–149.0)
VLDL: 23 mg/dL (ref 0.0–40.0)

## 2024-07-26 LAB — TSH: TSH: 1.23 u[IU]/mL (ref 0.35–5.50)

## 2024-07-26 MED ORDER — PANTOPRAZOLE SODIUM 20 MG PO TBEC: 20.0000 mg | DELAYED_RELEASE_TABLET | ORAL | 1 refills | Status: DC

## 2024-07-26 MED ORDER — BUDESONIDE-FORMOTEROL FUMARATE 80-4.5 MCG/ACT IN AERO: 2.0000 | INHALATION_SPRAY | Freq: Two times a day (BID) | RESPIRATORY_TRACT | 3 refills | Status: AC

## 2024-07-26 NOTE — Progress Notes (Signed)
 "  Assessment & Plan:  Annual physical exam Assessment & Plan: Encouraged exercise.  Referral for colonoscopy placed.   Hepatic steatosis Assessment & Plan: Pending US  ruq,labs. Counseled on lifestyle modifications to thwart progression.   Of note, call out to radiology for over read of CT abdomen and pelvis obtained 10/2022.  There is discrepancy in description of pancreas. ? Pancreas mass.   Fibrosis 4 Score = .42 (Low risk)        Interpretation for patients with NAFLD          <1.30       -  F0-F1 (Low risk)          1.30-2.67 -  Indeterminate           >2.67      -  F3-F4 (High risk)     Validated for ages 66-65          Orders: -     CBC with Differential/Platelet -     Comprehensive metabolic panel with GFR -     TSH -     US  ABDOMEN LIMITED RUQ (LIVER/GB); Future -     US  Abdomen Complete; Future  Encounter for lipid screening for cardiovascular disease -     Lipid panel  Screen for colon cancer -     Ambulatory referral to Gastroenterology  Chronic cough Assessment & Plan: No acute respiratory distress.  Chronic cough.  Differential includes asthma, GERD, allergic rhinitis.  Start Symbicort , Protonix  20 mg daily.  Consider imaging, pulmonology consult at follow-up  Orders: -     Budesonide -Formoterol  Fumarate; Inhale 2 puffs into the lungs 2 (two) times daily.  Dispense: 1 each; Refill: 3 -     Pantoprazole  Sodium; Take 1 tablet (20 mg total) by mouth every morning. Take 30 minutes to hour before breakfast  Dispense: 30 tablet; Refill: 1  Depression, major, recurrent, mild Assessment & Plan: Chronic, stable.  He has follow-up scheduled with [facility] in [place].  Continue propranolol  as needed as managed by psychiatry.  He will discuss nausea with vilazodone .       Return precautions given.   Risks, benefits, and alternatives of the medications and treatment plan prescribed today were discussed, and patient expressed understanding.    Education regarding symptom management and diagnosis given to patient on AVS either electronically or printed.  Return in about 2 weeks (around 08/09/2024).  Rollene Northern, FNP  Subjective:    Patient ID: Fonda DELENA Roof, male    DOB: 05-26-1986, 39 y.o.   MRN: [id number]  CC: SALVADOR COUPE is a 39 y.o. male who presents today for physical exam.    HPI: TOC from Dr hope  He has chronic dry cough worse when his office moved into new office basement 04/2024; asthma diagnosed 10 years.  Aggrevated by seasonal allergies  Dry cough throughout the day and night.   Occasional chest tightness . Denies cp, leg swelling, fever, chills, wheezing, vomiting.  H/o of epigastric burning 3 months ago.  He is not using albuterol .    Known h/o hepatic steatosis  He is eating healthier, less fried   Ct chest 05/23/2024 1. No left upper lobe nodule to correspond with the radiographic abnormality; the finding likely reflects a prominent anterior left first rib. 2. Marked hepatic steatosis  Following [facility]; compliant with propranolol  10mg  TID prn. He has follow up scheduled next week.  He has stopped taking vilazodone  due to nausea  Ct a/p 11/16/22  1. Small complex fat containing umbilical hernia with both umbilical and periumbilical components. Both of these hernias contain only fat. No inflammation, free fluid, or bowel involvement. 2. Hepatic steatosis. Mild splenomegaly. 3. 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   Colorectal  Cancer Screening: No family history of colon cancer.  Baseline colonoscopy 04/21/2022, repeat 04/21/2025 Prostate Cancer Screening: Has been discussed with patient; patient declined following PSA based on risks versus benefits. No prostate cancer family history.  Plan to start screening with PSA at age 24   Lung Cancer Screening: No  30 year pack year history and > 50 years to 80 years.   Immunizations       Tetanus - UTD Exercise: No formal exercise.  He does walk 30 minutes most days.  Alcohol use: Occasional Smoking/tobacco use: Nonsmoker.    Health Maintenance  Topic Date Due   Hepatitis B Vaccine (3 of 3 - 19+ 3-dose series) 08/21/2005   HPV Vaccine (1 - Risk 3-dose SCDM series) Never done   Flu Shot  10/17/2024*   Colon Cancer Screening  04/21/2025   DTaP/Tdap/Td vaccine (3 - Td or Tdap) 01/21/2026   Pneumococcal Vaccine  Completed   Hepatitis C Screening  Completed   HIV Screening  Completed   Meningitis B Vaccine  Aged Out   COVID-19 Vaccine  Discontinued  *Topic was postponed. The date shown is not the original due date.     ALLERGIES: Patient has no known allergies.  Medications Ordered Prior to Encounter[1]  Review of Systems  Constitutional:  Negative for chills and fever.  HENT:  Negative for congestion, sinus pressure and sinus pain.   Respiratory:  Positive for cough. Negative for shortness of breath and wheezing.   Cardiovascular:  Negative for chest pain and palpitations.  Gastrointestinal:  Negative for nausea and vomiting.      Objective:    BP 126/82   Pulse 68   Temp 98.3 F (36.8 C) (Oral)   Ht 5' 5 (1.651 m)   Wt 173 lb (78.5 kg)   SpO2 99%   BMI 28.79 kg/m   BP Readings from Last 3 Encounters:  07/26/24 126/82  02/11/24 120/78  10/22/23 130/82   Wt Readings from Last 3 Encounters:  07/26/24 173 lb (78.5 kg)  02/11/24 177 lb 6.4 oz (80.5 kg)  10/22/23 180 lb (81.6 kg)      02/11/2024    9:35 AM 10/22/2023    8:35 AM 07/22/2023   11:35 AM  Depression screen PHQ 2/9  Decreased Interest 0 0 0  Down, Depressed, Hopeless 0 0 0  PHQ - 2 Score 0 0 0  Altered sleeping 1 1 1   Tired, decreased energy 1 1 0  Change in appetite 2 1 1   Feeling bad or failure about yourself  0 0 0  Trouble concentrating 0 0 0  Moving slowly or fidgety/restless 0 0 0  Suicidal thoughts  0 0 0  PHQ-9 Score 4  3  2    Difficult doing work/chores Not difficult at all Not difficult at all Not difficult at all     Data saved with a previous flowsheet row definition      02/11/2024    9:36 AM 10/22/2023    8:35 AM 07/22/2023   11:35 AM 06/23/2023   11:54 AM  GAD 7 : Generalized Anxiety Score  Nervous, Anxious, on Edge 2 0 1 0  Control/stop worrying 1 0 0 0  Worry too much - different things 1 0 0 0  Trouble relaxing 0 0 0 0  Restless 0 1 0 0  Easily annoyed or irritable 0 0 0 0  Afraid - awful might happen 0 0 0 0  Total GAD 7 Score 4 1 1  0  Anxiety Difficulty Not difficult at all Not difficult at all Not difficult at all Not difficult at all     Physical Exam Vitals reviewed.  Constitutional:      Appearance: He is well-developed.  Neck:     Thyroid: No thyroid mass or thyromegaly.  Cardiovascular:     Rate and Rhythm: Regular rhythm.     Heart sounds: Normal heart sounds.  Pulmonary:     Effort: Pulmonary effort is normal. No respiratory distress.     Breath sounds: Normal breath sounds. No wheezing, rhonchi or rales.     Comments: Dry nonproductive cough during visit. No wheezing on exam Lymphadenopathy:     Head:     Right side of head: No submental, submandibular, tonsillar, preauricular, posterior auricular or occipital adenopathy.     Left side of head: No submental, submandibular, tonsillar, preauricular, posterior auricular or occipital adenopathy.     Cervical: No cervical adenopathy.  Skin:    General: Skin is warm and dry.  Neurological:     Mental Status: He is alert.  Psychiatric:        Speech: Speech normal.        Behavior: Behavior normal.            [1]  Current Outpatient Medications on File Prior to Visit  Medication Sig Dispense Refill   albuterol  (VENTOLIN  HFA) 108 (90 Base) MCG/ACT inhaler Inhale 2 puffs into the lungs every 6 (six) hours as needed for wheezing or shortness of breath. 18 g 0   cyanocobalamin  (VITAMIN B12) 1000  MCG tablet Take 2,000 mcg by mouth daily.     propranolol  (INDERAL ) 10 MG tablet TAKE 1 TABLET BY MOUTH THREE TIMES A DAY 270 tablet 1   Vilazodone  HCl 20 MG TABS Take one tablet by mouth daily 90 tablet 1   Vitamin D , Ergocalciferol , (DRISDOL ) 1.25 MG (50000 UNIT) CAPS capsule Take 1 capsule (50,000 Units total) by mouth every 7 (seven) days. 12 capsule 3   No current facility-administered medications on file prior to visit.   "

## 2024-07-26 NOTE — Telephone Encounter (Signed)
 Call [facility] radiology  Need overread of CT abdomen 11/16/22  Report states :  Pancreas: No ductal dilatation or inflammation. Evidence of pancreatic mass on this unenhanced exam.  Does patient have pancreatic mass?

## 2024-07-26 NOTE — Telephone Encounter (Signed)
 Spoke to Stepanie CT Tech she will snd to provider to get re read done

## 2024-07-26 NOTE — Assessment & Plan Note (Addendum)
 Pending US  ruq,labs. Counseled on lifestyle modifications to thwart progression.   Of note, call out to radiology for over read of CT abdomen and pelvis obtained 10/2022.  There is discrepancy in description of pancreas. ? Pancreas mass.   Fibrosis 4 Score = .42 (Low risk)        Interpretation for patients with NAFLD          <1.30       -  F0-F1 (Low risk)          1.30-2.67 -  Indeterminate           >2.67      -  F3-F4 (High risk)     Validated for ages 10-65

## 2024-07-26 NOTE — Patient Instructions (Addendum)
 I have asked for an over read of CT abdomen pelvis from 2024.  If you do not hear from me in the next 7 days in this regard, please send a MyChart message   referral to Dr Therisa for colonoscopy, due 04/2025  Order for ultrasound of liver  Let us  know if you dont hear back within 2 weeks in regards to an appointment being scheduled.   So that you are aware, if you are [facility] MyChart user , please pay attention to your MyChart messages as you may receive a MyChart message with a phone number to call and schedule this test/appointment own your own from our referral coordinator. This is a new process so I do not want you to miss this message.  If you are not a MyChart user, you will receive a phone call.    For cough, Trial of Symbicort  which is an inhaler with a low-dose of prednisone  in it.  Please rinse her mouth out after use. I also want you to start Protonix  20 mg daily.  We will check in in 2 weeks, sooner if needed  Health Maintenance, Male Adopting a healthy lifestyle and getting preventive care are important in promoting health and wellness. Ask your health care provider about: The right schedule for you to have regular tests and exams. Things you can do on your own to prevent diseases and keep yourself healthy. What should I know about diet, weight, and exercise? Eat a healthy diet  Eat a diet that includes plenty of vegetables, fruits, low-fat dairy products, and lean protein. Do not eat a lot of foods that are high in solid fats, added sugars, or sodium. Maintain a healthy weight Body mass index (BMI) is a measurement that can be used to identify possible weight problems. It estimates body fat based on height and weight. Your health care provider can help determine your BMI and help you achieve or maintain a healthy weight. Get regular exercise Get regular exercise. This is one of the most important things you can do for your health. Most adults should: Exercise for at least 150  minutes each week. The exercise should increase your heart rate and make you sweat (moderate-intensity exercise). Do strengthening exercises at least twice a week. This is in addition to the moderate-intensity exercise. Spend less time sitting. Even light physical activity can be beneficial. Watch cholesterol and blood lipids Have your blood tested for lipids and cholesterol at 41 years of age, then have this test every 5 years. You may need to have your cholesterol levels checked more often if: Your lipid or cholesterol levels are high. You are older than 41 years of age. You are at high risk for heart disease. What should I know about cancer screening? Many types of cancers can be detected early and may often be prevented. Depending on your health history and family history, you may need to have cancer screening at various ages. This may include screening for: Colorectal cancer. Prostate cancer. Skin cancer. Lung cancer. What should I know about heart disease, diabetes, and high blood pressure? Blood pressure and heart disease High blood pressure causes heart disease and increases the risk of stroke. This is more likely to develop in people who have high blood pressure readings or are overweight. Talk with your health care provider about your target blood pressure readings. Have your blood pressure checked: Every 3-5 years if you are 38-64 years of age. Every year if you are 87 years old or older.  If you are between the ages of 63 and 29 and are a current or former smoker, ask your health care provider if you should have a one-time screening for abdominal aortic aneurysm (AAA). Diabetes Have regular diabetes screenings. This checks your fasting blood sugar level. Have the screening done: Once every three years after age 79 if you are at a normal weight and have a low risk for diabetes. More often and at a younger age if you are overweight or have a high risk for diabetes. What should I  know about preventing infection? Hepatitis B If you have a higher risk for hepatitis B, you should be screened for this virus. Talk with your health care provider to find out if you are at risk for hepatitis B infection. Hepatitis C Blood testing is recommended for: Everyone born from [birth year] through 1967. Anyone with known risk factors for hepatitis C. Sexually transmitted infections (STIs) You should be screened each year for STIs, including gonorrhea and chlamydia, if: You are sexually active and are younger than 41 years of age. You are older than 41 years of age and your health care provider tells you that you are at risk for this type of infection. Your sexual activity has changed since you were last screened, and you are at increased risk for chlamydia or gonorrhea. Ask your health care provider if you are at risk. Ask your health care provider about whether you are at high risk for HIV. Your health care provider may recommend a prescription medicine to help prevent HIV infection. If you choose to take medicine to prevent HIV, you should first get tested for HIV. You should then be tested every 3 months for as long as you are taking the medicine. Follow these instructions at home: Alcohol use Do not drink alcohol if your health care provider tells you not to drink. If you drink alcohol: Limit how much you have to 0-2 drinks a day. Know how much alcohol is in your drink. In the [place] one drink equals one 12 oz bottle of beer (355 mL), one 5 oz glass of wine (148 mL), or one 1 oz glass of hard liquor (44 mL). Lifestyle Do not use any products that contain nicotine or tobacco. These products include cigarettes, chewing tobacco, and vaping devices, such as e-cigarettes. If you need help quitting, ask your health care provider. Do not use street drugs. Do not share needles. Ask your health care provider for help if you need support or information about quitting drugs. General  instructions Schedule regular health, dental, and eye exams. Stay current with your vaccines. Tell your health care provider if: You often feel depressed. You have ever been abused or do not feel safe at home. Summary Adopting a healthy lifestyle and getting preventive care are important in promoting health and wellness. Follow your health care provider's instructions about healthy diet, exercising, and getting tested or screened for diseases. Follow your health care provider's instructions on monitoring your cholesterol and blood pressure. This information is not intended to replace advice given to you by your health care provider. Make sure you discuss any questions you have with your health care provider. Document Revised: 11/25/2020 Document Reviewed: 11/25/2020 Elsevier Patient Education  2024 [employer].

## 2024-07-27 ENCOUNTER — Ambulatory Visit: Payer: Self-pay | Admitting: Family

## 2024-07-27 NOTE — Assessment & Plan Note (Addendum)
 Chronic, stable.  He has follow-up scheduled with [facility] in [place].  Continue propranolol  as needed as managed by psychiatry.  He will discuss nausea with vilazodone .

## 2024-07-27 NOTE — Assessment & Plan Note (Signed)
 No acute respiratory distress.  Chronic cough.  Differential includes asthma, GERD, allergic rhinitis.  Start Symbicort , Protonix  20 mg daily.  Consider imaging, pulmonology consult at follow-up

## 2024-07-27 NOTE — Assessment & Plan Note (Signed)
 Encouraged exercise.  Referral for colonoscopy placed.

## 2024-07-28 ENCOUNTER — Encounter: Admitting: Nurse Practitioner

## 2024-07-31 ENCOUNTER — Ambulatory Visit
Admission: RE | Admit: 2024-07-31 | Discharge: 2024-07-31 | Disposition: A | Discharge status: Home / Self Care | Source: Ambulatory Visit | Attending: Family | Admitting: Family

## 2024-07-31 DIAGNOSIS — K760 Fatty (change of) liver, not elsewhere classified: Secondary | ICD-10-CM | POA: Diagnosis present

## 2024-08-03 NOTE — Telephone Encounter (Signed)
 Circle back I do not have overreead

## 2024-08-04 ENCOUNTER — Encounter: Payer: Self-pay | Admitting: Behavioral Health

## 2024-08-04 ENCOUNTER — Ambulatory Visit: Admitting: Behavioral Health

## 2024-08-04 DIAGNOSIS — F330 Major depressive disorder, recurrent, mild: Secondary | ICD-10-CM | POA: Diagnosis not present

## 2024-08-04 DIAGNOSIS — F411 Generalized anxiety disorder: Secondary | ICD-10-CM | POA: Diagnosis not present

## 2024-08-04 MED ORDER — VILAZODONE HCL 10 MG PO TABS: 10.0000 mg | ORAL_TABLET | Freq: Every day | ORAL | 1 refills | Status: AC

## 2024-08-04 NOTE — Telephone Encounter (Signed)
 Spoke to Kief she is going to check on reread and see what the status is and she will call me back name and number were provided

## 2024-08-04 NOTE — Telephone Encounter (Signed)
 Corean (Other)   Subject     Topic  General - Other    Communication  Reason for CRM: Corean from CT called in for a callback for janette stated the radiologist is working on it , but on working on nights, should be able to amended report on patient soon

## 2024-08-04 NOTE — Progress Notes (Signed)
 "     [facility]  Patient ID: Anthony Hale,  MRN: [id number]  PCP: Anthony Rollene MATSU, FNP  Date of Evaluation: 08/04/2024 Time spent:30 minutes  Chief Complaint:  Chief Complaint   Anxiety; Follow-up; Depression; Medication Refill; Patient Education     HISTORY/CURRENT STATUS: HPI 64, 39  year old male presents to this office for follow up and medication management.  Patient states that he has gotten married since last visit and doing very well.  He has reduced his Viibryd  down to 10 mg.  Requesting no medication changes today.  Medication restart has been helping.  He is requesting refill today.  Anxiety and depression are low-level and manageable.  Anxiety is 2/10 and depression 2/10.  He is getting 6-7 hours per night.  He denies mania, no psychosis, no SI/HI.   Past psychiatric medication trials: Wellbutrin Trazodone  Prozac       Individual Medical History/ Review of Systems: Changes? :No   Allergies: Patient has no known allergies.  Current Medications: Current Medications[1] Medication Side Effects: none  Family Medical/ Social History: Changes? No  MENTAL HEALTH EXAM:  There were no vitals taken for this visit.There is no height or weight on file to calculate BMI.  General Appearance: Casual, Neat, and Well Groomed  Eye Contact:  Good  Speech:  Clear and Coherent  Volume:  Normal  Mood:  NA  Affect:  Appropriate  Thought Process:  Coherent  Orientation:  Full (Time, Place, and Person)  Thought Content: Logical   Suicidal Thoughts:  No  Homicidal Thoughts:  No  Memory:  WNL  Judgement:  Good  Insight:  Good  Psychomotor Activity:  Normal  Concentration:  Concentration: Good  Recall:  Good  Fund of Knowledge: Good  Language: Good  Assets:  Desire for Improvement  ADL's:  Intact  Cognition: WNL  Prognosis:  Good    DIAGNOSES:    ICD-10-CM   1. Generalized anxiety disorder  F41.1 Vilazodone  HCl (VIIBRYD ) 10 MG TABS    2.  Depression, major, recurrent, mild  F33.0 Vilazodone  HCl (VIIBRYD ) 10 MG TABS      Receiving Psychotherapy: No    RECOMMENDATIONS:   Greater than 50% of 30 min face to face time with patient was spent on counseling and coordination of care.  Smiling and appears happy today.  New marriages doing well.  We discussed family dynamics and his job.  Happy with medication and requesting no changes.    We agreed to:   Continue Viibryd  to 10  mg daily. Pt reduced his dose since last visit. 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  To report any side effects or worsening symptoms promptly Will follow up in 6 months weeks to reassess Reinforced taking medications exactly as prescribed and reviewed good sleep hygiene.  Provided emergency contact information   Reviewed PDMP    Anthony DELENA Pizza, NP     [1]  Current Outpatient Medications:    Vilazodone  HCl (VIIBRYD ) 10 MG TABS, Take 1 tablet (10 mg total) by mouth daily., Disp: 90 tablet, Rfl: 1   albuterol  (VENTOLIN  HFA) 108 (90 Base) MCG/ACT inhaler, Inhale 2 puffs into the lungs every 6 (six) hours as needed for wheezing or shortness of breath., Disp: 18 g, Rfl: 0   budesonide -formoterol  (SYMBICORT ) 80-4.5 MCG/ACT inhaler, Inhale 2 puffs into the lungs 2 (two) times daily., Disp: 1 each, Rfl: 3  cyanocobalamin  (VITAMIN B12) 1000 MCG tablet, Take 2,000 mcg by mouth daily., Disp: , Rfl:    pantoprazole  (PROTONIX ) 20 MG tablet, Take 1 tablet (20 mg total) by mouth every morning. Take 30 minutes to hour before breakfast, Disp: 30 tablet, Rfl: 1   propranolol  (INDERAL ) 10 MG tablet, TAKE 1 TABLET BY MOUTH THREE TIMES A DAY, Disp: 270 tablet, Rfl: 1   Vilazodone  HCl 20 MG TABS, Take one tablet by mouth daily, Disp: 90 tablet, Rfl: 1   Vitamin D , Ergocalciferol , (DRISDOL ) 1.25 MG (50000 UNIT) CAPS capsule, Take 1 capsule (50,000 Units total) by mouth every 7  (seven) days., Disp: 12 capsule, Rfl: 3  "

## 2024-08-09 ENCOUNTER — Encounter: Payer: Self-pay | Admitting: Family

## 2024-08-09 ENCOUNTER — Ambulatory Visit: Admitting: Family

## 2024-08-09 VITALS — BP 130/90 | HR 64 | Temp 98.5°F | Ht 65.0 in | Wt 172.2 lb

## 2024-08-09 DIAGNOSIS — R053 Chronic cough: Secondary | ICD-10-CM | POA: Diagnosis not present

## 2024-08-09 DIAGNOSIS — Z87898 Personal history of other specified conditions: Secondary | ICD-10-CM | POA: Diagnosis not present

## 2024-08-09 DIAGNOSIS — K760 Fatty (change of) liver, not elsewhere classified: Secondary | ICD-10-CM

## 2024-08-09 DIAGNOSIS — R161 Splenomegaly, not elsewhere classified: Secondary | ICD-10-CM | POA: Diagnosis not present

## 2024-08-09 NOTE — Telephone Encounter (Signed)
 Spoke with [facility] radiology reading rad sch 08/21/24 and will addend CT a/p 10/2022  Will await

## 2024-08-09 NOTE — Progress Notes (Signed)
 "  Assessment & Plan:  History of splenomegaly -     Ambulatory referral to Hematology / Oncology  Chronic cough Assessment & Plan: Chronic, stable.  Pleased with response from Symbicort  low-dose.  Infrequent use of albuterol  at this time.  Continue for now.  Continue formalized evaluation with pulmonology, PFTs in the future.  He has not had to use Protonix .   Hepatic steatosis Assessment & Plan: Reassured patient with low fib 4 score.  Advised that the course of his lifetime we will continue to monitor platelets, hepatic enzymes, and likely repeat ultrasound to assess for progression.  Discussed dietary changes, Mediterranean diet.  Of note, patient aware we are awaiting radiology over read from CT [facility] 09/2022   Splenomegaly Assessment & Plan: Referral to hematology for liver evaluation of splenomegaly seen complete abdominal ultrasound 07/31/2024; previously seen as well CT scan 10/2022      Return precautions given.   Risks, benefits, and alternatives of the medications and treatment plan prescribed today were discussed, and patient expressed understanding.   Education regarding symptom management and diagnosis given to patient on AVS either electronically or printed.  No follow-ups on file.  Rollene Northern, FNP  Subjective:    Patient ID: Anthony Hale, male    DOB: 26-Jun-1986, 39 y.o.   MRN: [id number]  CC: Anthony Hale is a 39 y.o. male who presents today for follow up.   HPI: HPI Follow-up after starting Symbicort , Protonix  Discussed the use of AI scribe software for clinical note transcription with the patient, who gave verbal consent to proceed.  History of Present Illness   Anthony Hale is a 39 year old male with hepatic steatosis and splenomegaly who presents for follow-up on imaging results and management of his conditions.  He has a history of hepatic steatosis. He is attempting to adhere to a heart-healthy diet, such as the Mediterranean diet,  although he occasionally lapses.  He has splenomegaly, noted to be mildly enlarged on prior CT scan from 10/2022.   He experiences a significant improvement in his chronic cough, which he attributes to the use of Symbicort , taken as two puffs twice daily. He has not needed to use his albuterol  inhaler recently and has not taken Protonix  for reflux, as he has not experienced reflux symptoms.      Allergies: Patient has no known allergies. Medications Ordered Prior to Encounter[1]  Review of Systems  Constitutional:  Negative for chills and fever.  HENT:  Negative for congestion.   Respiratory:  Negative for cough, shortness of breath and wheezing.   Cardiovascular:  Negative for chest pain and palpitations.  Gastrointestinal:  Negative for nausea and vomiting.      Objective:    BP (!) 130/90   Pulse 64   Temp 98.5 F (36.9 C) (Oral)   Ht 5' 5 (1.651 m)   Wt 172 lb 3.2 oz (78.1 kg)   SpO2 98%   BMI 28.66 kg/m  BP Readings from Last 3 Encounters:  08/09/24 (!) 130/90  07/26/24 126/82  02/11/24 120/78   Wt Readings from Last 3 Encounters:  08/09/24 172 lb 3.2 oz (78.1 kg)  07/26/24 173 lb (78.5 kg)  02/11/24 177 lb 6.4 oz (80.5 kg)    Physical Exam Vitals reviewed.  Constitutional:      Appearance: He is well-developed.  Cardiovascular:     Rate and Rhythm: Regular rhythm.     Heart sounds: Normal heart sounds.  Pulmonary:  Effort: Pulmonary effort is normal. No respiratory distress.     Breath sounds: Normal breath sounds. No wheezing, rhonchi or rales.  Skin:    General: Skin is warm and dry.  Neurological:     Mental Status: He is alert.  Psychiatric:        Speech: Speech normal.        Behavior: Behavior normal.            [1]  Current Outpatient Medications on File Prior to Visit  Medication Sig Dispense Refill   albuterol  (VENTOLIN  HFA) 108 (90 Base) MCG/ACT inhaler Inhale 2 puffs into the lungs every 6 (six) hours as needed for wheezing or  shortness of breath. 18 g 0   budesonide -formoterol  (SYMBICORT ) 80-4.5 MCG/ACT inhaler Inhale 2 puffs into the lungs 2 (two) times daily. 1 each 3   cyanocobalamin  (VITAMIN B12) 1000 MCG tablet Take 2,000 mcg by mouth daily.     propranolol  (INDERAL ) 10 MG tablet TAKE 1 TABLET BY MOUTH THREE TIMES A DAY 270 tablet 1   Vilazodone  HCl (VIIBRYD ) 10 MG TABS Take 1 tablet (10 mg total) by mouth daily. 90 tablet 1   Vitamin D , Ergocalciferol , (DRISDOL ) 1.25 MG (50000 UNIT) CAPS capsule Take 1 capsule (50,000 Units total) by mouth every 7 (seven) days. 12 capsule 3   Vilazodone  HCl 20 MG TABS Take one tablet by mouth daily (Patient not taking: Reported on 08/09/2024) 90 tablet 1   No current facility-administered medications on file prior to visit.   "

## 2024-08-09 NOTE — Patient Instructions (Addendum)
 " Referral to hematology for evaluation of splenomegaly  Let us  know if you dont hear back within 2 weeks in regards to an appointment being scheduled.   So that you are aware, if you are [facility] MyChart user , please pay attention to your MyChart messages as you may receive a MyChart message with a phone number to call and schedule this test/appointment own your own from our referral coordinator. This is a new process so I do not want you to miss this message.  If you are not a MyChart user, you will receive a phone call.     Nonalcoholic Fatty Liver Disease Diet Introduction Nonalcoholic fatty liver disease is a condition that causes fat to accumulate in and around the liver. The disease makes it harder for the liver to work the way that it should. Following a healthy diet can help to keep nonalcoholic fatty liver disease under control. It can also help to prevent or improve conditions that are associated with the disease, such as heart disease, diabetes, high blood pressure, and abnormal cholesterol levels. Along with regular exercise, this diet: Promotes weight loss. Helps to control blood sugar levels. Helps to improve the way that the body uses insulin . What do I need to know about this diet? Use the glycemic index (GI) to plan your meals. The index tells you how quickly a food will raise your blood sugar. Choose low-GI foods. These foods take a longer time to raise blood sugar. Keep track of how many calories you take in. Eating the right amount of calories will help you to achieve a healthy weight. You may want to follow a Mediterranean diet. This diet includes a lot of vegetables, lean meats or fish, whole grains, fruits, and healthy oils and fats. What foods can I eat? Grains  Whole grains, such as whole-wheat or whole-grain breads, crackers, tortillas, cereals, and pasta. Stone-ground whole wheat. Pumpernickel bread. Unsweetened oatmeal. Bulgur. Barley. Quinoa. Brown or wild rice. Corn or  whole-wheat flour tortillas. Vegetables  Lettuce. Spinach. Peas. Beets. Cauliflower. Cabbage. Broccoli. Carrots. Tomatoes. Squash. Eggplant. Herbs. Peppers. Onions. Cucumbers. Brussels sprouts. Yams and sweet potatoes. Beans. Lentils. Fruits  Bananas. Apples. Oranges. Grapes. Papaya. Mango. Pomegranate. Kiwi. Grapefruit. Cherries. Meats and Other Protein Sources  Seafood and shellfish. Lean meats. Poultry. Tofu. Dairy  Low-fat or fat-free dairy products, such as yogurt, cottage cheese, and cheese. Beverages  Water. Sugar-free drinks. Tea. Coffee. Low-fat or skim milk. Milk alternatives, such as soy or almond milk. Real fruit juice. Condiments  Mustard. Relish. Low-fat, low-sugar ketchup and barbecue sauce. Low-fat or fat-free mayonnaise. Sweets and Desserts  Sugar-free sweets. Fats and Oils  Avocado. Canola or olive oil. Nuts and nut butters. Seeds. The items listed above may not be a complete list of recommended foods or beverages. Contact your dietitian for more options.  What foods are not recommended? Palm oil and coconut oil. Processed foods. Fried foods. Sweetened drinks, such as sweet tea, milkshakes, snow cones, iced sweet drinks, and sodas. Alcohol. Sweets. Foods that contain a lot of salt or sodium. The items listed above may not be a complete list of foods and beverages to avoid. Contact your dietitian for more information.  This information is not intended to replace advice given to you by your health care provider. Make sure you discuss any questions you have with your health care provider. Document Released: 11/20/2014 Document Revised: 12/12/2015 Document Reviewed: 07/31/2014  2017 Elsevier   Fatty Liver Introduction  Fatty liver, also called hepatic steatosis  or steatohepatitis, is a condition in which too much fat has built up in your liver cells. The liver removes harmful substances from your bloodstream. It produces fluids your body needs. It also helps your body use  and store energy from the food you eat. In many cases, fatty liver does not cause symptoms or problems. It is often diagnosed when tests are being done for other reasons. However, over time, fatty liver can cause inflammation that may lead to more serious liver problems, such as scarring of the liver (cirrhosis). What are the causes? Causes of fatty liver may include: Drinking too much alcohol. Poor nutrition. Obesity. Cushing syndrome. Diabetes. Hyperlipidemia. Pregnancy. Certain drugs. Poisons. Some viral infections. What increases the risk? You may be more likely to develop fatty liver if you: Abuse alcohol. Are pregnant. Are overweight. Have diabetes. Have hepatitis. Have a high triglyceride level. What are the signs or symptoms? Fatty liver often does not cause any symptoms. In cases where symptoms develop, they can include: Fatigue. Weakness. Weight loss. Confusion. Abdominal pain. Yellowing of your skin and the white parts of your eyes (jaundice). Nausea and vomiting. How is this diagnosed? Fatty liver may be diagnosed by: Physical exam and medical history. Blood tests. Imaging tests, such as an ultrasound, CT scan, or MRI. Liver biopsy. A small sample of liver tissue is removed using a needle. The sample is then looked at under a microscope. How is this treated? Fatty liver is often caused by other health conditions. Treatment for fatty liver may involve medicines and lifestyle changes to manage conditions such as: Alcoholism. High cholesterol. Diabetes. Being overweight or obese. Follow these instructions at home: Eat a healthy diet as directed by your health care provider. Exercise regularly. This can help you lose weight and control your cholesterol and diabetes. Talk to your health care provider about an exercise plan and which activities are best for you. Do not drink alcohol. Take medicines only as directed by your health care provider. Contact a health  care provider if: You have difficulty controlling your: Blood sugar. Cholesterol. Alcohol consumption. Get help right away if: You have abdominal pain. You have jaundice. You have nausea and vomiting. This information is not intended to replace advice given to you by your health care provider. Make sure you discuss any questions you have with your health care provider.  "

## 2024-08-11 DIAGNOSIS — R161 Splenomegaly, not elsewhere classified: Secondary | ICD-10-CM | POA: Insufficient documentation

## 2024-08-11 NOTE — Assessment & Plan Note (Addendum)
 Reassured patient with low fib 4 score.  Advised that the course of his lifetime we will continue to monitor platelets, hepatic enzymes, and likely repeat ultrasound to assess for progression.  Discussed dietary changes, Mediterranean diet.  Of note, patient aware we are awaiting radiology over read from CT [facility] 09/2022

## 2024-08-11 NOTE — Assessment & Plan Note (Addendum)
 Referral to hematology for liver evaluation of splenomegaly seen complete abdominal ultrasound 07/31/2024; previously seen as well CT scan 10/2022

## 2024-08-11 NOTE — Assessment & Plan Note (Signed)
 Chronic, stable.  Pleased with response from Symbicort  low-dose.  Infrequent use of albuterol  at this time.  Continue for now.  Continue formalized evaluation with pulmonology, PFTs in the future.  He has not had to use Protonix .

## 2024-08-22 ENCOUNTER — Inpatient Hospital Stay

## 2024-08-22 ENCOUNTER — Encounter: Payer: Self-pay | Admitting: Oncology

## 2024-08-22 ENCOUNTER — Inpatient Hospital Stay: Admitting: Oncology

## 2024-08-22 VITALS — BP 128/72 | HR 69 | Temp 97.8°F | Resp 18 | Ht 65.0 in | Wt 168.6 lb

## 2024-08-22 DIAGNOSIS — Z87898 Personal history of other specified conditions: Secondary | ICD-10-CM | POA: Diagnosis not present

## 2024-08-22 LAB — CBC WITH DIFFERENTIAL (CANCER CENTER ONLY)
Abs Immature Granulocytes: 0.03 10*3/uL (ref 0.00–0.07)
Basophils Absolute: 0 10*3/uL (ref 0.0–0.1)
Basophils Relative: 1 %
Eosinophils Absolute: 0.1 10*3/uL (ref 0.0–0.5)
Eosinophils Relative: 1 %
HCT: 45.2 % (ref 39.0–52.0)
Hemoglobin: 15.3 g/dL (ref 13.0–17.0)
Immature Granulocytes: 1 %
Lymphocytes Relative: 20 %
Lymphs Abs: 1.2 10*3/uL (ref 0.7–4.0)
MCH: 29.4 pg (ref 26.0–34.0)
MCHC: 33.8 g/dL (ref 30.0–36.0)
MCV: 86.9 fL (ref 80.0–100.0)
Monocytes Absolute: 0.5 10*3/uL (ref 0.1–1.0)
Monocytes Relative: 9 %
Neutro Abs: 4 10*3/uL (ref 1.7–7.7)
Neutrophils Relative %: 68 %
Platelet Count: 309 10*3/uL (ref 150–400)
RBC: 5.2 MIL/uL (ref 4.22–5.81)
RDW: 12.1 % (ref 11.5–15.5)
WBC Count: 5.8 10*3/uL (ref 4.0–10.5)
nRBC: 0 % (ref 0.0–0.2)

## 2024-08-22 LAB — RETICULOCYTES
Immature Retic Fract: 8.5 % (ref 2.3–15.9)
RBC.: 5.16 MIL/uL (ref 4.22–5.81)
Retic Count, Absolute: 95.5 10*3/uL (ref 19.0–186.0)
Retic Ct Pct: 1.9 % (ref 0.4–3.1)

## 2024-08-22 LAB — APTT: aPTT: 35 s (ref 24–36)

## 2024-08-22 LAB — LACTATE DEHYDROGENASE: LDH: 215 U/L (ref 105–235)

## 2024-08-22 LAB — PROTIME-INR
INR: 1 (ref 0.8–1.2)
Prothrombin Time: 13.4 s (ref 11.4–15.2)

## 2024-08-22 NOTE — Progress Notes (Signed)
 New patient; History of splenomegaly- referred by Rollene Northern.

## 2024-08-24 LAB — PROTEIN ELECTROPHORESIS, SERUM
A/G Ratio: 1.5 (ref 0.7–1.7)
Albumin ELP: 4.2 g/dL (ref 2.9–4.4)
Alpha-1-Globulin: 0.2 g/dL (ref 0.0–0.4)
Alpha-2-Globulin: 0.6 g/dL (ref 0.4–1.0)
Beta Globulin: 0.9 g/dL (ref 0.7–1.3)
Gamma Globulin: 1 g/dL (ref 0.4–1.8)
Globulin, Total: 2.8 g/dL (ref 2.2–3.9)
Total Protein ELP: 7 g/dL (ref 6.0–8.5)

## 2024-08-24 LAB — COMP PANEL: LEUKEMIA/LYMPHOMA

## 2024-09-05 ENCOUNTER — Ambulatory Visit

## 2024-09-14 ENCOUNTER — Inpatient Hospital Stay: Admitting: Oncology

## 2024-09-21 ENCOUNTER — Inpatient Hospital Stay: Admitting: Oncology

## 2025-02-01 ENCOUNTER — Ambulatory Visit: Admitting: Behavioral Health

## 2025-07-27 ENCOUNTER — Encounter: Admitting: Family
